# Patient Record
Sex: Female | Born: 1935 | Race: White | Hispanic: No | Smoking: Never smoker
Health system: Southern US, Community
[De-identification: ages and names within clinical notes are randomized; demographics above are authoritative.]

## PROBLEM LIST (undated history)

## (undated) DIAGNOSIS — E079 Disorder of thyroid, unspecified: Secondary | ICD-10-CM

## (undated) DIAGNOSIS — R001 Bradycardia, unspecified: Secondary | ICD-10-CM

## (undated) DIAGNOSIS — R17 Unspecified jaundice: Secondary | ICD-10-CM

## (undated) DIAGNOSIS — J90 Pleural effusion, not elsewhere classified: Secondary | ICD-10-CM

## (undated) DIAGNOSIS — N183 Chronic kidney disease, stage 3 unspecified: Secondary | ICD-10-CM

## (undated) DIAGNOSIS — I4819 Other persistent atrial fibrillation: Secondary | ICD-10-CM

## (undated) DIAGNOSIS — I10 Essential (primary) hypertension: Secondary | ICD-10-CM

## (undated) DIAGNOSIS — G250 Essential tremor: Secondary | ICD-10-CM

## (undated) DIAGNOSIS — I82409 Acute embolism and thrombosis of unspecified deep veins of unspecified lower extremity: Secondary | ICD-10-CM

## (undated) DIAGNOSIS — I5032 Chronic diastolic (congestive) heart failure: Secondary | ICD-10-CM

## (undated) DIAGNOSIS — E8809 Other disorders of plasma-protein metabolism, not elsewhere classified: Secondary | ICD-10-CM

## (undated) DIAGNOSIS — J309 Allergic rhinitis, unspecified: Principal | ICD-10-CM

## (undated) DIAGNOSIS — I4510 Unspecified right bundle-branch block: Secondary | ICD-10-CM

## (undated) HISTORY — DX: Other disorders of plasma-protein metabolism, not elsewhere classified: E88.09

## (undated) HISTORY — DX: Bradycardia, unspecified: R00.1

## (undated) HISTORY — DX: Acute embolism and thrombosis of unspecified deep veins of unspecified lower extremity: I82.409

## (undated) HISTORY — DX: Unspecified right bundle-branch block: I45.10

## (undated) HISTORY — DX: Essential (primary) hypertension: I10

## (undated) HISTORY — DX: Pleural effusion, not elsewhere classified: J90

## (undated) HISTORY — DX: Unspecified jaundice: R17

## (undated) HISTORY — DX: Allergic rhinitis, unspecified: J30.9

## (undated) HISTORY — PX: BUNIONECTOMY: SHX129

## (undated) HISTORY — DX: Other persistent atrial fibrillation: I48.19

## (undated) HISTORY — DX: Chronic kidney disease, stage 3 (moderate): N18.3

## (undated) HISTORY — DX: Chronic kidney disease, stage 3 unspecified: N18.30

## (undated) HISTORY — DX: Chronic diastolic (congestive) heart failure: I50.32

## (undated) HISTORY — DX: Essential tremor: G25.0

## (undated) HISTORY — PX: DILATION AND CURETTAGE OF UTERUS: SHX78

## (undated) HISTORY — PX: TONSILLECTOMY: SUR1361

---

## 2006-04-20 ENCOUNTER — Encounter: Payer: Self-pay | Admitting: Internal Medicine

## 2006-04-26 ENCOUNTER — Encounter: Payer: Self-pay | Admitting: Internal Medicine

## 2006-05-16 ENCOUNTER — Encounter: Payer: Self-pay | Admitting: Internal Medicine

## 2006-06-18 ENCOUNTER — Encounter: Payer: Self-pay | Admitting: Internal Medicine

## 2006-12-11 ENCOUNTER — Ambulatory Visit: Payer: Self-pay | Admitting: Internal Medicine

## 2006-12-11 DIAGNOSIS — E0590 Thyrotoxicosis, unspecified without thyrotoxic crisis or storm: Secondary | ICD-10-CM

## 2006-12-11 DIAGNOSIS — I10 Essential (primary) hypertension: Secondary | ICD-10-CM | POA: Insufficient documentation

## 2006-12-11 DIAGNOSIS — E041 Nontoxic single thyroid nodule: Secondary | ICD-10-CM

## 2007-02-11 ENCOUNTER — Ambulatory Visit: Payer: Self-pay | Admitting: Internal Medicine

## 2007-02-11 LAB — CONVERTED CEMR LAB
Calcium: 9.6 mg/dL (ref 8.4–10.5)
Free T4: 0.8 ng/dL (ref 0.6–1.6)
GFR calc Af Amer: 67 mL/min
GFR calc non Af Amer: 56 mL/min
Potassium: 4.4 meq/L (ref 3.5–5.1)
Sodium: 145 meq/L (ref 135–145)

## 2007-02-12 ENCOUNTER — Encounter: Payer: Self-pay | Admitting: Internal Medicine

## 2007-03-22 ENCOUNTER — Telehealth: Payer: Self-pay | Admitting: Internal Medicine

## 2007-07-04 ENCOUNTER — Ambulatory Visit: Payer: Self-pay | Admitting: Internal Medicine

## 2007-07-25 ENCOUNTER — Encounter: Payer: Self-pay | Admitting: Internal Medicine

## 2007-09-23 ENCOUNTER — Encounter: Payer: Self-pay | Admitting: Internal Medicine

## 2008-01-21 ENCOUNTER — Ambulatory Visit: Payer: Self-pay | Admitting: Internal Medicine

## 2008-01-21 LAB — CONVERTED CEMR LAB
BUN: 21 mg/dL
Basophils Absolute: 0.1 K/uL
Basophils Relative: 1.2 %
CO2: 30 meq/L
Calcium: 9.5 mg/dL
Chloride: 104 meq/L
Creatinine, Ser: 0.9 mg/dL
Eosinophils Absolute: 0.2 K/uL
Eosinophils Relative: 2.2 %
GFR calc Af Amer: 76 mL/min
GFR calc non Af Amer: 63 mL/min
Glucose, Bld: 116 mg/dL — ABNORMAL HIGH
HCT: 37.7 %
Hemoglobin: 12.9 g/dL
Lymphocytes Relative: 19.3 %
MCHC: 34.3 g/dL
MCV: 91.7 fL
Monocytes Absolute: 0.7 K/uL
Monocytes Relative: 7.4 %
Neutro Abs: 6.4 K/uL
Neutrophils Relative %: 69.9 %
Platelets: 262 K/uL
Potassium: 4.8 meq/L
RBC: 4.11 M/uL
RDW: 12.8 %
Sodium: 143 meq/L
TSH: 2.08 u[IU]/mL
WBC: 9.2 10*3/microliter

## 2008-07-22 ENCOUNTER — Telehealth: Payer: Self-pay | Admitting: Internal Medicine

## 2008-08-04 ENCOUNTER — Ambulatory Visit: Payer: Self-pay | Admitting: Internal Medicine

## 2008-08-05 DIAGNOSIS — G252 Other specified forms of tremor: Secondary | ICD-10-CM

## 2008-08-05 DIAGNOSIS — G250 Essential tremor: Secondary | ICD-10-CM

## 2008-12-28 ENCOUNTER — Ambulatory Visit: Payer: Self-pay | Admitting: Internal Medicine

## 2008-12-28 DIAGNOSIS — I4891 Unspecified atrial fibrillation: Secondary | ICD-10-CM

## 2008-12-28 LAB — CONVERTED CEMR LAB
BUN: 17 mg/dL (ref 6–23)
CO2: 32 meq/L (ref 19–32)
Creatinine, Ser: 1.1 mg/dL (ref 0.4–1.2)
Free T4: 0.9 ng/dL (ref 0.6–1.6)
Glucose, Bld: 112 mg/dL — ABNORMAL HIGH (ref 70–99)
Potassium: 4.1 meq/L (ref 3.5–5.1)
Sodium: 143 meq/L (ref 135–145)

## 2009-01-01 ENCOUNTER — Telehealth: Payer: Self-pay | Admitting: Internal Medicine

## 2009-01-03 ENCOUNTER — Telehealth: Payer: Self-pay | Admitting: Family Medicine

## 2009-01-04 ENCOUNTER — Telehealth: Payer: Self-pay | Admitting: Internal Medicine

## 2009-01-08 ENCOUNTER — Telehealth: Payer: Self-pay | Admitting: Internal Medicine

## 2009-01-12 ENCOUNTER — Encounter: Payer: Self-pay | Admitting: Internal Medicine

## 2009-01-12 ENCOUNTER — Ambulatory Visit (HOSPITAL_COMMUNITY): Admission: RE | Admit: 2009-01-12 | Discharge: 2009-01-12 | Payer: Self-pay | Admitting: Internal Medicine

## 2009-01-12 ENCOUNTER — Telehealth: Payer: Self-pay | Admitting: Internal Medicine

## 2009-01-12 ENCOUNTER — Ambulatory Visit: Payer: Self-pay | Admitting: Internal Medicine

## 2009-01-12 ENCOUNTER — Ambulatory Visit: Payer: Self-pay

## 2009-01-12 DIAGNOSIS — I359 Nonrheumatic aortic valve disorder, unspecified: Secondary | ICD-10-CM | POA: Insufficient documentation

## 2009-02-01 ENCOUNTER — Telehealth: Payer: Self-pay | Admitting: Internal Medicine

## 2009-03-22 ENCOUNTER — Ambulatory Visit: Payer: Self-pay | Admitting: Internal Medicine

## 2009-04-06 HISTORY — PX: LAPAROSCOPIC OVARIAN CYSTECTOMY: SUR786

## 2009-04-27 ENCOUNTER — Telehealth: Payer: Self-pay | Admitting: Internal Medicine

## 2009-04-27 ENCOUNTER — Ambulatory Visit: Payer: Self-pay | Admitting: Internal Medicine

## 2009-04-27 DIAGNOSIS — S0180XA Unspecified open wound of other part of head, initial encounter: Secondary | ICD-10-CM | POA: Insufficient documentation

## 2009-04-28 ENCOUNTER — Telehealth: Payer: Self-pay | Admitting: Internal Medicine

## 2009-05-05 ENCOUNTER — Ambulatory Visit: Payer: Self-pay | Admitting: Internal Medicine

## 2009-05-24 ENCOUNTER — Emergency Department (HOSPITAL_COMMUNITY): Admission: EM | Admit: 2009-05-24 | Discharge: 2009-05-24 | Payer: Self-pay | Admitting: Emergency Medicine

## 2009-05-25 ENCOUNTER — Telehealth: Payer: Self-pay | Admitting: Internal Medicine

## 2009-05-26 ENCOUNTER — Ambulatory Visit: Payer: Self-pay | Admitting: Internal Medicine

## 2009-05-26 DIAGNOSIS — R1903 Right lower quadrant abdominal swelling, mass and lump: Secondary | ICD-10-CM | POA: Insufficient documentation

## 2009-05-27 ENCOUNTER — Encounter: Admission: RE | Admit: 2009-05-27 | Discharge: 2009-05-27 | Payer: Self-pay | Admitting: Internal Medicine

## 2009-06-01 ENCOUNTER — Ambulatory Visit: Admission: RE | Admit: 2009-06-01 | Discharge: 2009-06-01 | Payer: Self-pay | Admitting: Gynecology

## 2009-06-01 ENCOUNTER — Encounter: Payer: Self-pay | Admitting: Internal Medicine

## 2009-06-08 ENCOUNTER — Encounter: Payer: Self-pay | Admitting: Obstetrics & Gynecology

## 2009-06-08 ENCOUNTER — Inpatient Hospital Stay (HOSPITAL_COMMUNITY): Admission: RE | Admit: 2009-06-08 | Discharge: 2009-06-11 | Payer: Self-pay | Admitting: Obstetrics & Gynecology

## 2009-06-18 ENCOUNTER — Telehealth: Payer: Self-pay | Admitting: Internal Medicine

## 2009-06-21 ENCOUNTER — Ambulatory Visit: Payer: Self-pay | Admitting: Internal Medicine

## 2009-06-21 DIAGNOSIS — I872 Venous insufficiency (chronic) (peripheral): Secondary | ICD-10-CM | POA: Insufficient documentation

## 2009-07-22 ENCOUNTER — Ambulatory Visit: Admission: RE | Admit: 2009-07-22 | Discharge: 2009-07-22 | Payer: Self-pay | Admitting: Gynecologic Oncology

## 2010-03-08 NOTE — Miscellaneous (Signed)
Summary: Doctor, [facility]   Imported By: Lester Saybrook 05/06/2009 12:03:53  _____________________________________________________________________  External Attachment:    Type:   Image     Comment:   External Document

## 2010-03-08 NOTE — Assessment & Plan Note (Signed)
Summary: FELL/ HIT FORHEAD/PER TRIAGE/NWS   Vital Signs:  Patient profile:   77 year old female Height:      67 inches O2 Sat:      97 % on Room air Temp:     97.1 degrees F oral Pulse rate:   63 / minute BP sitting:   142 / 60  (left arm) Cuff size:   regular  Vitals Entered By: Bill Salinas CMA (April 27, 2009 2:59 PM)  O2 Flow:  Room air   Primary Care Provider:  Gerard Bonus   History of Present Illness: Patient presents emergently: she fell at home striking her head on a TV stand sustaining a large and deep laceration to her right forehead. She had no loss of consciousness. She has no neurologic complaints. She has had no palpation, no chest pain, no respiratory problems. She denies feeling light-headed.  Current Medications (verified): 1)  Aspirin 325 Mg  Tabs (Aspirin) .... Take 1 Tablet By Mouth Once A Day 2)  Stool Softener 100 Mg  Caps (Docusate Sodium) .... As Needed 3)  Caltrate 600+d Plus 600-400 Mg-Unit  Tabs (Calcium Carbonate-Vit D-Min) .... Take 1 Tablet By Mouth Two Times A Day 4)  Otc Omega 3 1000mg  .... 2 By Mouth Once Daily 5)  Methimazole 5 Mg  Tabs (Methimazole) .Marland Kitchen.. 1 By Mouth Once Daily 6)  Taztia Xt 180 Mg  Cp24 (Diltiazem Hcl Er Beads) .Marland Kitchen.. 1 By Mouth Once Daily 7)  Labetalol Hcl 200 Mg Tabs (Labetalol Hcl) .... 2 Tablets Two Times A Day 8)  Furosemide 40 Mg  Tabs (Furosemide) .Marland Kitchen.. 1 Once Daily 9)  Eye Vitamins   Caps (Multiple Vitamins-Minerals) .... Take 1 Tablet By Mouth Once A Day  Allergies (verified): No Known Drug Allergies  Past History:  Past Medical History: Last updated: 12/11/2006 UCD rapid heart rate arrythmia Hypertension familial tremor  Past Surgical History: Last updated: 12/11/2006 Tonsillectomy-'39 GYN surgery-D&C '66 bunionectomy '88  G3P3-NSVD PSH reviewed for relevance, FH reviewed for relevance  Review of Systems  The patient denies anorexia, fever, weight loss, decreased hearing, hoarseness, chest pain, syncope,  headaches, abdominal pain, incontinence, muscle weakness, difficulty walking, abnormal bleeding, and enlarged lymph nodes.    Physical Exam  General:  Elderly woman with tremor who is awake and alert Head:  laceration on forehead down to bone; 8 cm in length from mid-forehead to over the right eye with a small leg to above the right eyebrow. Eyes:  vision grossly intact, pupils equal, and pupils round.   Lungs:  normal respiratory effort.   Heart:  normal rate and regular rhythm.   Neurologic:  alert & oriented X3 and cranial nerves II-XII intact.   Skin:  turgor normal and color normal.  See above for description of laceration  Psych:  Oriented X3, memory intact for recent and remote, normally interactive, and good eye contact.     Impression & Recommendations:  Problem # 1:  WOUND, OPEN, FACE, WITHOUT COMPLICATION (ICD-873.40)  Patient with large facial laceration-right forehead 7-8 cm in length. She was offered referral to plastic surgery for repair for better cosmesis. She declined and asked me to proceed with repair.  The wound was copiously irriated with sterile water using 30cc syringe and #18g needle for high pressure cleansing. Anesthesia obtained with 2% xylocaine w/ epinephrine injected to wound edges. Prepped with betadine.  Closure with 11 interrupted sutures using #3 ethilon with good wound edge approximation.  Pressure dressing applied with 4x4 folded gauze and  4" kerlex wrap.  Plan - telephone follow-up 3/23.           Routine wound precautions provided          Return in 1 week for suture removal  Orders: Lacerat Intermd NHF 2.6 - 7.5 cm (16109)  Complete Medication List: 1)  Aspirin 325 Mg Tabs (Aspirin) .... Take 1 tablet by mouth once a day 2)  Stool Softener 100 Mg Caps (Docusate sodium) .... As needed 3)  Caltrate 600+d Plus 600-400 Mg-unit Tabs (Calcium carbonate-vit d-min) .... Take 1 tablet by mouth two times a day 4)  Otc Omega 3 1000mg   .... 2 by mouth  once daily 5)  Methimazole 5 Mg Tabs (Methimazole) .Marland Kitchen.. 1 by mouth once daily 6)  Taztia Xt 180 Mg Cp24 (Diltiazem hcl er beads) .Marland Kitchen.. 1 by mouth once daily 7)  Labetalol Hcl 200 Mg Tabs (Labetalol hcl) .... 2 tablets two times a day 8)  Furosemide 40 Mg Tabs (Furosemide) .Marland Kitchen.. 1 once daily 9)  Eye Vitamins Caps (Multiple vitamins-minerals) .... Take 1 tablet by mouth once a day

## 2010-03-08 NOTE — Progress Notes (Signed)
  Phone Note Outgoing Call   Call placed by: Ami Bullins CMA,  Jun 18, 2009 4:53 PM Summary of Call: call Ms. Angevine to see how she was doing with no answer. Initial call taken by: Ami Bullins CMA,  Jun 18, 2009 4:53 PM

## 2010-03-08 NOTE — Assessment & Plan Note (Signed)
Summary: STITCH REMOVAL/NWS   Vital Signs:  Patient profile:   77 year old female Height:      67 inches Weight:      156 pounds O2 Sat:      98 % on Room air Temp:     96.7 degrees F oral Pulse rate:   54 / minute BP sitting:   136 / 62  (left arm) Cuff size:   regular  Vitals Entered By: Bill Salinas CMA (May 05, 2009 9:28 AM)  O2 Flow:  Room air CC: pt here for suture removal, 11 sutures removed from pt's forehead, pt tolerated well/ ab   Primary Care Provider:  Whitaker Holderman  CC:  pt here for suture removal, 11 sutures removed from pt's forehead, and pt tolerated well/ ab.  History of Present Illness: Patient seen last week for laceration on the right forehead requiring suture closure. She has done well without swelling, drainage, fever or pain. She presents for suture removal  Current Medications (verified): 1)  Aspirin 325 Mg  Tabs (Aspirin) .... Take 1 Tablet By Mouth Once A Day 2)  Stool Softener 100 Mg  Caps (Docusate Sodium) .... As Needed 3)  Caltrate 600+d Plus 600-400 Mg-Unit  Tabs (Calcium Carbonate-Vit D-Min) .... Take 1 Tablet By Mouth Two Times A Day 4)  Otc Omega 3 1000mg  .... 2 By Mouth Once Daily 5)  Methimazole 5 Mg  Tabs (Methimazole) .Marland Kitchen.. 1 By Mouth Once Daily 6)  Taztia Xt 180 Mg  Cp24 (Diltiazem Hcl Er Beads) .Marland Kitchen.. 1 By Mouth Once Daily 7)  Labetalol Hcl 200 Mg Tabs (Labetalol Hcl) .... 2 Tablets Two Times A Day 8)  Furosemide 40 Mg  Tabs (Furosemide) .Marland Kitchen.. 1 Once Daily 9)  Eye Vitamins   Caps (Multiple Vitamins-Minerals) .... Take 1 Tablet By Mouth Once A Day  Allergies (verified): No Known Drug Allergies  Review of Systems  The patient denies fever, weight loss, headaches, abnormal bleeding, and enlarged lymph nodes.    Physical Exam  General:  Elderly white female in no distress Skin:  wound on forehead looks well healed with good wound edge approximation. No surrounding edema or erythema. Sutures removed by Ami Bullin CMA without difficulty or  problem.   Impression & Recommendations:  Problem # 1:  WOUND, OPEN, FACE, WITHOUT COMPLICATION (ICD-873.40) Good healing. Sutures removed.  Plan - cleanse with soap and water           skin mositurizer to healing wound, e.g. cocoa butter.  Complete Medication List: 1)  Aspirin 325 Mg Tabs (Aspirin) .... Take 1 tablet by mouth once a day 2)  Stool Softener 100 Mg Caps (Docusate sodium) .... As needed 3)  Caltrate 600+d Plus 600-400 Mg-unit Tabs (Calcium carbonate-vit d-min) .... Take 1 tablet by mouth two times a day 4)  Otc Omega 3 1000mg   .... 2 by mouth once daily 5)  Methimazole 5 Mg Tabs (Methimazole) .Marland Kitchen.. 1 by mouth once daily 6)  Taztia Xt 180 Mg Cp24 (Diltiazem hcl er beads) .Marland Kitchen.. 1 by mouth once daily 7)  Labetalol Hcl 200 Mg Tabs (Labetalol hcl) .... 2 tablets two times a day 8)  Furosemide 40 Mg Tabs (Furosemide) .Marland Kitchen.. 1 once daily 9)  Eye Vitamins Caps (Multiple vitamins-minerals) .... Take 1 tablet by mouth once a day

## 2010-03-08 NOTE — Progress Notes (Signed)
  Phone Note Outgoing Call   Reason for Call: Get patient information Summary of Call: Please call Ms. Berkely today about her wound and how she is doing.  THANKS Initial call taken by: Jacques Navy MD,  April 28, 2009 6:44 AM  Follow-up for Phone Call        pt states her head is doing well and the bleeding has stopped. she states she only bleed here in the office. Pt states she is a little sore where she feel but other than that she is doing well Follow-up by: Ami Bullins CMA,  April 28, 2009 10:04 AM

## 2010-03-08 NOTE — Assessment & Plan Note (Signed)
Summary: LEG PAIN--STC   Vital Signs:  Patient profile:   77 year old female Height:      67 inches Weight:      154 pounds O2 Sat:      95 % on Room air Temp:     97.5 degrees F oral Pulse rate:   56 / minute BP sitting:   154 / 62  (left arm) Cuff size:   regular  Vitals Entered By: Bill Salinas CMA (March 22, 2009 4:18 PM)  O2 Flow:  Room air CC: pt here with complaint of soreness in her left lower leg, pt states it is sore to the touch. Pt has never had tetanus, pneumonia shot, bone density, colonoscopy, or mammogram/ ab   Primary Care Provider:  Briggs Edelen  CC:  pt here with complaint of soreness in her left lower leg, pt states it is sore to the touch. Pt has never had tetanus, pneumonia shot, bone density, colonoscopy, and or mammogram/ ab.  History of Present Illness: Patinet last seen Dec '10. Her Bp has been variable.  In the interval she has had bilateral cataract surgery. 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 As a result she has been off ASA for 5 weeks. She developed knots in the medial left calf at the site of small varicose veins. she has been exercising and applying heat and this has improved. she also resumed asa.     Current Medications (verified): 1)  Aspirin 325 Mg  Tabs (Aspirin) .... Take 1 Tablet By Mouth Once A Day 2)  Stool Softener 100 Mg  Caps (Docusate Sodium) .... As Needed 3)  Caltrate 600+d Plus 600-400 Mg-Unit  Tabs (Calcium Carbonate-Vit D-Min) .... Take 1 Tablet By Mouth Two Times A Day 4)  Otc Omega 3 1000mg  .... 2 By Mouth Once Daily 5)  Methimazole 5 Mg  Tabs (Methimazole) .Marland Kitchen.. 1 By Mouth Once Daily 6)  Taztia Xt 180 Mg  Cp24 (Diltiazem Hcl Er Beads) .... 2 By Mouth Once Daily 7)  Labetalol Hcl 200 Mg Tabs (Labetalol Hcl) .... 2 Tablets Two Times A Day 8)  Furosemide 40 Mg  Tabs (Furosemide) .Marland Kitchen.. 1 Once Daily 9)  Eye Vitamins   Caps (Multiple Vitamins-Minerals) .... 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 Laymen's Guide with forms provided.  Risk Factors: Alcohol Use: 0 (08/04/2008) Caffeine Use: less than 1 beverage a day (08/04/2008) Diet: heart healthy (08/04/2008) Exercise: no (08/04/2008)  Risk Factors: Smoking Status: never (08/04/2008) Passive Smoke Exposure: no (12/11/2006)  Review of Systems  The patient denies anorexia, fever, weight loss, decreased hearing, hoarseness, dyspnea on exertion, prolonged cough, abdominal pain, muscle weakness, and difficulty walking.    Physical Exam  General:  WNWD elderly woman in no distress Lungs:  normal respiratory effort and normal breath  sounds.   Heart:  normal rate and regular rhythm.   Msk:  normal ROM, no joint tenderness, and no joint swelling.   Skin:  palpale nodule over minor varicosities left calf with tenderness and minimal erythema Psych:  Oriented X3, memory intact for recent and remote, and normally interactive.     Impression & Recommendations:  Problem # 1:  PHLEBITIS&THROMBOPHLEB SUP VESSELS LOWER EXTREM (ICD-451.0) Patient has made a good diagnosis of superficial phlebitis and initiated  appropriate therapy with exercise, heat and APAP. There is no need for antibiotics. This is a self-limited problem and should resolve in 2 weeks. Cautioned to watch for increased pain, heat or fever.  Complete Medication List: 1)  Aspirin 325 Mg Tabs (Aspirin) .... Take 1 tablet by mouth once a day 2)  Stool Softener 100 Mg Caps (Docusate sodium) .... As needed 3)  Caltrate 600+d Plus 600-400 Mg-unit Tabs (Calcium carbonate-vit d-min) .... Take 1 tablet by mouth two times a day 4)  Otc Omega 3 1000mg   .... 2 by mouth once daily 5)  Methimazole 5 Mg Tabs (Methimazole) .Marland Kitchen.. 1 by mouth once daily 6)  Taztia Xt 180 Mg Cp24 (Diltiazem hcl er beads) .Marland Kitchen.. 1 by mouth once daily 7)  Labetalol Hcl 200 Mg Tabs (Labetalol hcl) .... 2 tablets two times a day 8)  Furosemide 40 Mg Tabs (Furosemide) .Marland Kitchen.. 1 once daily 9)  Eye Vitamins Caps (Multiple vitamins-minerals) .... Take 1 tablet by mouth once a day  Patient Instructions: 1)  Phlebitis - good diagnosis by patient and good treatment plan: exericse, warm compresses and support hose. Continue aspirin as you have. No need for antibiotics at this time. This should resolve in about 2 weeks. 2)  Blood pressure - see med list below. This is concordant with the series of phone calls and medication adjustments.  3)  The medication list was reviewed and reconciled.  All changed / newly prescribed medications were explained.  A complete medication list was provided to the patient / caregiver.

## 2010-03-08 NOTE — Consult Note (Signed)
Summary: GYN Oncology  GYN Oncology   Imported By: Lester Sammamish 06/08/2009 11:36:10  _____________________________________________________________________  External Attachment:    Type:   Image     Comment:   External Document

## 2010-03-08 NOTE — Progress Notes (Signed)
Summary: WORK IN?   Phone Note Call from Patient   Caller: Daughter Aram Beecham - [phone] Summary of Call: Pt's daugther called. Pt's feet became tangled in a carpet at her home and she fell hitting her forehead on the tv stand approx 30 min ago. Bleeding is under control with ice and cold compress. Per daughter area is approx 3/4 inch triangle that looks to her like it needs sutures.  Initial call taken by: Lamar Sprinkles, CMA,  April 27, 2009 1:59 PM  Follow-up for Phone Call        Pt was seen in the office today Follow-up by: Lamar Sprinkles, CMA,  April 27, 2009 5:24 PM

## 2010-03-08 NOTE — Assessment & Plan Note (Signed)
Summary: both legs swollen/cd   Vital Signs:  Patient profile:   77 year old female Height:      67 inches Weight:      157 pounds BMI:     24.68 O2 Sat:      96 % on Room air Temp:     97.0 degrees F oral Pulse rate:   60 / minute BP sitting:   144 / 60  (left arm) Cuff size:   regular  Vitals Entered By: Bill Salinas CMA (Jun 21, 2009 4:47 PM)  O2 Flow:  Room air CC: pt retaining fluid in both legs/ ab   Primary Care Provider:  Norins  CC:  pt retaining fluid in both legs/ ab.  History of Present Illness: Patient with recent surgery for removal of a giant ovarian cyst. she has done very well post-op and is back almost 100% to normal level of activity.  She has a h/o distal LE edema that gets progressive during the day and resolve/improves overnight. She has no h/o CHF, LV dysfunction or renal failure  Current Medications (verified): 1)  Aspirin 325 Mg  Tabs (Aspirin) .... Take 1 Tablet By Mouth Once A Day 2)  Stool Softener 100 Mg  Caps (Docusate Sodium) .... As Needed 3)  Caltrate 600+d Plus 600-400 Mg-Unit  Tabs (Calcium Carbonate-Vit D-Min) .... Take 1 Tablet By Mouth Two Times A Day 4)  Otc Omega 3 1000mg  .... 2 By Mouth Once Daily 5)  Methimazole 5 Mg  Tabs (Methimazole) .Marland Kitchen.. 1 By Mouth Once Daily 6)  Taztia Xt 180 Mg  Cp24 (Diltiazem Hcl Er Beads) .Marland Kitchen.. 1 By Mouth Once Daily 7)  Labetalol Hcl 200 Mg Tabs (Labetalol Hcl) .... 2 Tablets Two Times A Day 8)  Furosemide 40 Mg  Tabs (Furosemide) .Marland Kitchen.. 1 Once Daily 9)  Eye Vitamins   Caps (Multiple Vitamins-Minerals) .... Take 1 Tablet By Mouth Once A Day  Allergies (verified): No Known Drug Allergies  Past History:  Past Surgical History: Tonsillectomy-'39 GYN surgery-D&C '66 bunionectomy '88 laparotomy and excision of  ovarian cyst March '11  G3P3-NSVD  Family History: Reviewed history from 12/11/2006 and no changes required. neg breast, colon cancer,  sister and brother with DM father with  CAD mother-deceased pancreatic cancer  Social History: Reviewed history from 12/11/2006 and no changes required. 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 Laymen's Guide with forms provided.  Review of Systems  The patient denies anorexia, fever, weight loss, weight gain, chest pain, syncope, headaches, hematuria, muscle weakness, difficulty walking, abnormal bleeding, and angioedema.    Physical Exam  General:  Well-developed,well-nourished,in no acute distress; alert,appropriate and cooperative throughout examination Head:  normocephalic and atraumatic.   Eyes:  pupils equal.   Lungs:  normal respiratory effort, normal breath sounds, no crackles, and no wheezes.   Heart:  normal rate and regular rhythm.   Pulses:  2+ radial  Neurologic:  alert & oriented X3, cranial nerves II-XII intact, strength normal in all extremities, gait normal, and DTRs symmetrical and normal.   Skin:  turgor normal, color normal, no ulcerations, and no edema.   Cervical Nodes:  no anterior cervical adenopathy and no posterior cervical adenopathy.   Psych:  Oriented X3, memory intact for recent and remote, and  good eye contact.     Impression & Recommendations:  Problem # 1:  VENOUS INSUFFICIENCY (ICD-459.81) explained in detail. No indication for medical management  Plan - elevation of legs           support hose: otc's first, if not helpful Rx  20-43mmHg  Problem # 2:  ABD/PELV SWELLING MASS/LUMP RIGHT LOWER QUADRANT (YNW-295.62) Patient found to have a benigh ovarian cyst - s/p excision with relief of back pain and other symptoms.   Complete Medication List: 1)  Aspirin 325 Mg Tabs (Aspirin)  .... Take 1 tablet by mouth once a day 2)  Stool Softener 100 Mg Caps (Docusate sodium) .... As needed 3)  Caltrate 600+d Plus 600-400 Mg-unit Tabs (Calcium carbonate-vit d-min) .... Take 1 tablet by mouth two times a day 4)  Otc Omega 3 1000mg   .... 2 by mouth once daily 5)  Methimazole 5 Mg Tabs (Methimazole) .Marland Kitchen.. 1 by mouth once daily 6)  Taztia Xt 180 Mg Cp24 (Diltiazem hcl er beads) .Marland Kitchen.. 1 by mouth once daily 7)  Labetalol Hcl 200 Mg Tabs (Labetalol hcl) .... 2 tablets two times a day 8)  Furosemide 40 Mg Tabs (Furosemide) .Marland Kitchen.. 1 once daily 9)  Eye Vitamins Caps (Multiple vitamins-minerals) .... Take 1 tablet by mouth once a day

## 2010-03-08 NOTE — Progress Notes (Signed)
Summary: Call Report  Phone Note Other Incoming   Caller: [facility] Call Report Summary of Call: [facility] Triage Call Report Triage Record Num: [id number] Operator: April Finney Patient Name: Danielle Adams Call Date & Time: 05/24/2009 9:13:49PM Patient Phone: PCP: Patient Gender: Female PCP Fax : Patient DOB: [birth year] Practice Name: [facility] Reason for Call: Daughter calling about unbearable abd pain that is constant. Had BM earlier with streaks of blood. Pain is on right lower abd and radiates down her leg. See in ED care advice given. Going to [facility]. Protocol(s) Used: Abdominal Pain / Discomfort Recommended Outcome per Protocol: See ED Immediately Reason for Outcome: Pain described as deep, boring, or tearing Care Advice:  ~ Another adult should drive.  ~ Do not give the patient anything to eat or drink.  ~ Do not push on abdomen. Write down provider's name. List or place the following in a bag for transport with the patient: current prescription and/or OTC medications; alternative treatments, therapies and medications; and street drugs.  ~  ~ IMMEDIATE ACTION 04/ Initial call taken by: [name] Pyle, CMA,  May 25, 2009 8:32 AM  Follow-up for Phone Call        reviewed ED note: large cystic mass in the area of he adenxa. Apparently sent home. Please call patient - may need to be seen if having pain and discomfort - if severe can do a direct admit.  Follow-up by: Jacques Navy MD,  May 25, 2009 9:33 AM  Additional Follow-up for Phone Call Additional follow up Details #1::        Per pt's daugther, Pt is taking hydrocodone/apap 5/500, last one taken was last night. She feels better today, c/o soreness today. ED told pt to go to GYN, [facility] at [facility]. They are unable to see pt until Monday. They are also not in network. Can we refer to another doctor who is in network?  Advised daughter if pt's pain returned to call  office. Additional Follow-up by: Lamar Sprinkles, CMA,  May 25, 2009 9:53 AM    Additional Follow-up for Phone Call Additional follow up Details #2::    Needs OV tomorrow or thursday with me to reveiw studies, exam her, etc. We can then make an appropriate referral. Follow-up by: Jacques Navy MD,  May 25, 2009 1:25 PM  Additional Follow-up for Phone Call Additional follow up Details #3:: Details for Additional Follow-up Action Taken: pt put in to see MD tomm. at 1pm Additional Follow-up by: Ami Bullins CMA,  May 25, 2009 1:41 PM

## 2010-03-08 NOTE — Assessment & Plan Note (Signed)
Summary: per md/ ab   Vital Signs:  Patient profile:   77 year old female Height:      67 inches (170.18 cm) Weight:      155.25 pounds (70.57 kg) O2 Sat:      96 % on Room air Temp:     98.2 degrees F (36.78 degrees C) oral Pulse rate:   62 / minute Pulse rhythm:   regular BP sitting:   114 / 48  (left arm) Cuff size:   regular  Vitals Entered By: Brenton Grills (May 26, 2009 1:18 PM)  O2 Flow:  Room air CC: pt here for office visit/pt states she went to ER monday night CAT scan was done and there was a mass found on right side(ovarian cyst) /pt states she has no appetitie, feeling nauseous/aj   Primary Care Provider:  Xia Stohr  CC:  pt here for office visit/pt states she went to ER monday night CAT scan was done and there was a mass found on right side(ovarian cyst) /pt states she has no appetitie and feeling nauseous/aj.  History of Present Illness: Patient for ED follow-up. She was seen in the ED for severe lower abdominal pain. CT revealed an 8 x 11 cm cystic mass in the pelvis which may be ovarian in origin. She continues to have pain and fullness in the pelvis, especially with sitting. She has not had weight loss, progressively increased girth, prior pain. She has had no fevers. She has no prior history of any gyn problems.   Current Medications (verified): 1)  Aspirin 325 Mg  Tabs (Aspirin) .... Take 1 Tablet By Mouth Once A Day 2)  Stool Softener 100 Mg  Caps (Docusate Sodium) .... As Needed 3)  Caltrate 600+d Plus 600-400 Mg-Unit  Tabs (Calcium Carbonate-Vit D-Min) .... Take 1 Tablet By Mouth Two Times A Day 4)  Otc Omega 3 1000mg  .... 2 By Mouth Once Daily 5)  Methimazole 5 Mg  Tabs (Methimazole) .Marland Kitchen.. 1 By Mouth Once Daily 6)  Taztia Xt 180 Mg  Cp24 (Diltiazem Hcl Er Beads) .Marland Kitchen.. 1 By Mouth Once Daily 7)  Labetalol Hcl 200 Mg Tabs (Labetalol Hcl) .... 2 Tablets Two Times A Day 8)  Furosemide 40 Mg  Tabs (Furosemide) .Marland Kitchen.. 1 Once Daily 9)  Eye Vitamins   Caps (Multiple  Vitamins-Minerals) .... Take 1 Tablet By Mouth Once A Day  Allergies (verified): No Known Drug Allergies  Past History:  Past Medical History: Last updated: 12/11/2006 UCD rapid heart rate arrythmia Hypertension familial tremor  Past Surgical History: Last updated: 12/11/2006 Tonsillectomy-'39 GYN surgery-D&C '66 bunionectomy '88  G3P3-NSVD  Family History: Last updated: 12/11/2006 neg breast, colon cancer,  sister and brother with DM father with CAD mother-deceased pancreatic cancer  Social History: Last updated: 12/11/2006 1 year business school married 1948- widowed 1981 3 daughters, 7 grandchildren, 2 great-grandchildren Work: Systems analyst, 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 Laymen's Guide with forms provided.  Risk Factors: Alcohol Use: 0 (08/04/2008) Caffeine Use: less than 1 beverage a day (08/04/2008) Diet: heart healthy (08/04/2008) Exercise: no (08/04/2008)  Risk Factors: Smoking Status: never (08/04/2008) Passive Smoke Exposure: no (12/11/2006)  Review of Systems       The patient complains of abdominal pain.  The patient denies anorexia, fever, weight loss, weight gain, hoarseness, syncope, peripheral edema, prolonged cough, headaches, melena, severe indigestion/heartburn,  muscle weakness, difficulty walking, unusual weight change, and enlarged lymph nodes.    Physical Exam  General:  Elderly whiute woamn who looks younger than her stated age. She is in no distress Eyes:  no jaundice Lungs:  normal respiratory effort.   Heart:  normal rate and regular rhythm.   Abdomen:  soft.  Tender in the lower quadrants Neurologic:  alert & oriented X3 and gait normal.   Skin:  turgor normal and color normal.   Psych:  Oriented X3, memory intact for recent and remote,  good eye contact, and not anxious appearing.     Impression & Recommendations:  Problem # 1:  ABD/PELV SWELLING MASS/LUMP RIGHT LOWER QUADRANT (ZOX-096.04) Newly discovered pelvic mass that appears cystic. Reviewed images and report with the patient.  Plan - MRI pelvic for 0900hr Thursday, 4/21.           Will need tissue diagnosis - aspiration or needle bx by gyn or surgery.  Orders: Radiology Referral (Radiology)  Complete Medication List: 1)  Aspirin 325 Mg Tabs (Aspirin) .... Take 1 tablet by mouth once a day 2)  Stool Softener 100 Mg Caps (Docusate sodium) .... As needed 3)  Caltrate 600+d Plus 600-400 Mg-unit Tabs (Calcium carbonate-vit d-min) .... Take 1 tablet by mouth two times a day 4)  Otc Omega 3 1000mg   .... 2 by mouth once daily 5)  Methimazole 5 Mg Tabs (Methimazole) .Marland Kitchen.. 1 by mouth once daily 6)  Taztia Xt 180 Mg Cp24 (Diltiazem hcl er beads) .Marland Kitchen.. 1 by mouth once daily 7)  Labetalol Hcl 200 Mg Tabs (Labetalol hcl) .... 2 tablets two times a day 8)  Furosemide 40 Mg Tabs (Furosemide) .Marland Kitchen.. 1 once daily 9)  Eye Vitamins Caps (Multiple vitamins-minerals) .... Take 1 tablet by mouth once a day

## 2010-04-26 LAB — BASIC METABOLIC PANEL
BUN: 12 mg/dL (ref 6–23)
BUN: 8 mg/dL (ref 6–23)
BUN: 9 mg/dL (ref 6–23)
CO2: 26 mEq/L (ref 19–32)
Calcium: 8.1 mg/dL — ABNORMAL LOW (ref 8.4–10.5)
Calcium: 8.5 mg/dL (ref 8.4–10.5)
Chloride: 101 mEq/L (ref 96–112)
Chloride: 98 mEq/L (ref 96–112)
Creatinine, Ser: 1.13 mg/dL (ref 0.4–1.2)
GFR calc Af Amer: 55 mL/min — ABNORMAL LOW (ref >60)
Glucose, Bld: 148 mg/dL — ABNORMAL HIGH (ref 70–99)
Glucose, Bld: 152 mg/dL — ABNORMAL HIGH (ref 70–99)
Sodium: 138 mEq/L (ref 135–145)

## 2010-04-26 LAB — DIFFERENTIAL
Basophils Absolute: 0 10*3/uL (ref 0.0–0.1)
Basophils Absolute: 0 10*3/uL (ref 0.0–0.1)
Basophils Relative: 0 % (ref 0–1)
Basophils Relative: 0 % (ref 0–1)
Eosinophils Absolute: 0.2 10*3/uL (ref 0.0–0.7)
Eosinophils Relative: 2 % (ref 0–5)
Lymphocytes Relative: 12 % (ref 12–46)
Lymphocytes Relative: 14 % (ref 12–46)
Lymphs Abs: 1.3 10*3/uL (ref 0.7–4.0)
Lymphs Abs: 1.4 10*3/uL (ref 0.7–4.0)
Monocytes Absolute: 1 10*3/uL (ref 0.1–1.0)
Monocytes Relative: 10 % (ref 3–12)
Neutro Abs: 7.5 10*3/uL (ref 1.7–7.7)
Neutrophils Relative %: 74 % (ref 43–77)
Neutrophils Relative %: 82 % — ABNORMAL HIGH (ref 43–77)

## 2010-04-26 LAB — CBC
HCT: 28.2 % — ABNORMAL LOW (ref 36.0–46.0)
HCT: 31.1 % — ABNORMAL LOW (ref 36.0–46.0)
Hemoglobin: 9.4 g/dL — ABNORMAL LOW (ref 12.0–15.0)
Hemoglobin: 10.3 g/dL — ABNORMAL LOW (ref 12.0–15.0)
Hemoglobin: 11.8 g/dL — ABNORMAL LOW (ref 12.0–15.0)
MCHC: 33.5 g/dL (ref 30.0–36.0)
MCHC: 32.9 g/dL (ref 30.0–36.0)
MCHC: 33.1 g/dL (ref 30.0–36.0)
MCV: 91.8 fL (ref 78.0–100.0)
MCV: 92.6 fL (ref 78.0–100.0)
Platelets: 356 10*3/uL (ref 150–400)
Platelets: 402 10*3/uL — ABNORMAL HIGH (ref 150–400)
RBC: 3.36 MIL/uL — ABNORMAL LOW (ref 3.87–5.11)
RBC: 3.9 MIL/uL (ref 3.87–5.11)
RDW: 14.4 % (ref 11.5–15.5)
RDW: 14.2 % (ref 11.5–15.5)
WBC: 10.1 10*3/uL (ref 4.0–10.5)
WBC: 10.9 10*3/uL — ABNORMAL HIGH (ref 4.0–10.5)

## 2010-04-26 LAB — COMPREHENSIVE METABOLIC PANEL
ALT: 20 U/L (ref 0–35)
AST: 23 U/L (ref 0–37)
Albumin: 3.1 g/dL — ABNORMAL LOW (ref 3.5–5.2)
Alkaline Phosphatase: 91 U/L (ref 39–117)
Alkaline Phosphatase: 93 U/L (ref 39–117)
BUN: 18 mg/dL (ref 6–23)
GFR calc non Af Amer: 55 mL/min — ABNORMAL LOW (ref >60)
Glucose, Bld: 145 mg/dL — ABNORMAL HIGH (ref 70–99)
Potassium: 3.5 mEq/L (ref 3.5–5.1)
Sodium: 140 mEq/L (ref 135–145)
Total Bilirubin: 0.4 mg/dL (ref 0.3–1.2)
Total Protein: 6.4 g/dL (ref 6.0–8.3)
Total Protein: 6.5 g/dL (ref 6.0–8.3)

## 2010-04-26 LAB — URINALYSIS, ROUTINE W REFLEX MICROSCOPIC
Glucose, UA: NEGATIVE mg/dL
Hgb urine dipstick: NEGATIVE
Ketones, ur: NEGATIVE mg/dL

## 2010-04-26 LAB — URINE MICROSCOPIC-ADD ON

## 2010-04-26 LAB — ABO/RH: ABO/RH(D): O POS

## 2010-04-26 LAB — TYPE AND SCREEN: ABO/RH(D): O POS

## 2010-05-30 ENCOUNTER — Other Ambulatory Visit: Payer: Self-pay | Admitting: Internal Medicine

## 2010-06-21 ENCOUNTER — Encounter: Payer: Self-pay | Admitting: *Deleted

## 2010-06-22 ENCOUNTER — Ambulatory Visit (INDEPENDENT_AMBULATORY_CARE_PROVIDER_SITE_OTHER): Payer: Medicare Other | Admitting: Internal Medicine

## 2010-06-22 DIAGNOSIS — H9190 Unspecified hearing loss, unspecified ear: Secondary | ICD-10-CM

## 2010-06-22 DIAGNOSIS — H6120 Impacted cerumen, unspecified ear: Secondary | ICD-10-CM

## 2010-06-22 MED ORDER — DOCUSATE SODIUM 100 MG PO CAPS: 100.0000 mg | ORAL_CAPSULE | ORAL | Status: DC | PRN

## 2010-06-22 MED ORDER — LABETALOL HCL 200 MG PO TABS: 200.0000 mg | ORAL_TABLET | Freq: Two times a day (BID) | ORAL | Status: DC

## 2010-06-22 MED ORDER — FUROSEMIDE 40 MG PO TABS: 40.0000 mg | ORAL_TABLET | Freq: Every day | ORAL | Status: DC

## 2010-06-22 MED ORDER — DILTIAZEM HCL ER BEADS 180 MG PO CP24: 180.0000 mg | ORAL_CAPSULE | Freq: Every day | ORAL | Status: DC

## 2010-06-22 MED ORDER — HYDROCORTISONE 2.5 % RE CREA: TOPICAL_CREAM | Freq: Two times a day (BID) | RECTAL | Status: DC

## 2010-06-22 NOTE — Progress Notes (Signed)
  Subjective:    Patient ID: Danielle Adams, female    DOB: 09-13-1916, 77 y.o.   MRN: [id number]  HPI Mrs. Lanyon presents for hearing loss and suspected cerumen impaction. She has had no fever or sweats or chills. She reports that she has been doing well.  Past Medical History  Diagnosis Date  . Rapid heart rate     arrythmia  . HTN (hypertension)   . Familial tremor    Past Surgical History  Procedure Date  . Tonsillectomy   . Dilation and curettage of uterus   . Bunionectomy   . Laparoscopic ovarian cystectomy 04/2009   Family History  Problem Relation Age of Onset  . Pancreatic cancer Mother   . Coronary artery disease Father   . Diabetes Sister   . Diabetes Brother    History   Social History  . Marital Status: Widowed    Spouse Name: N/A    Number of Children: N/A  . Years of Education: N/A   Occupational History  . Not on file.   Social History Main Topics  . Smoking status: Not on file  . Smokeless tobacco: Not on file  . Alcohol Use: Not on file  . Drug Use: Not on file  . Sexually Active: Not on file   Other Topics Concern  . 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 Laymen's Guide with forms provided.      Review of Systems Review of Systems  Constitutional:  Negative for fever, chills, activity change and unexpected weight change.  HENT:  Negative for hearing loss, ear pain, congestion, neck stiffness and postnasal drip.   Eyes: Negative for pain, discharge and visual disturbance.  Respiratory: Negative for chest tightness and wheezing.   Cardiovascular: Negative for chest pain and palpitations.       [No  decreased exercise tolerance Gastrointestinal: [No change in bowel habit. No bloating or gas. No reflux or indigestion Genitourinary: Negative for urgency, frequency, flank pain and difficulty urinating.  Musculoskeletal: Negative for myalgias, back pain, arthralgias and gait problem.  Neurological: Negative for dizziness, tremors, weakness and headaches.  Hematological: Negative for adenopathy.  Psychiatric/Behavioral: Negative for behavioral problems and dysphoric mood.      Objective:   Physical Exam Vitals reviewed Gen'l WNWD elderly white woman in no distress with a marked resting tremor HEENT- bilateral cerumen impactions. After irrigation the left EAC  Has a small abrasion at the posterior aspect       Assessment & Plan:  1. Cerumen impaction with hearing loss - much better after irrigation.   Plan - at the patient's request she will be referred to Dr. Marciano Sequin for audiometry and fitting for amplification.

## 2010-07-22 ENCOUNTER — Encounter: Payer: Self-pay | Admitting: Internal Medicine

## 2010-07-22 ENCOUNTER — Ambulatory Visit: Payer: PRIVATE HEALTH INSURANCE | Admitting: Internal Medicine

## 2010-07-22 ENCOUNTER — Ambulatory Visit (INDEPENDENT_AMBULATORY_CARE_PROVIDER_SITE_OTHER): Payer: Medicare Other | Admitting: Internal Medicine

## 2010-07-22 VITALS — BP 124/68 | HR 65 | Temp 97.5°F | Resp 14 | Wt 155.5 lb

## 2010-07-22 DIAGNOSIS — M79609 Pain in unspecified limb: Secondary | ICD-10-CM

## 2010-07-22 DIAGNOSIS — M79604 Pain in right leg: Secondary | ICD-10-CM

## 2010-07-22 MED ORDER — TRAMADOL HCL 50 MG PO TABS: 50.0000 mg | ORAL_TABLET | Freq: Four times a day (QID) | ORAL | Status: DC | PRN

## 2010-07-22 MED ORDER — PREDNISONE 10 MG PO TABS: 10.0000 mg | ORAL_TABLET | Freq: Every day | ORAL | Status: AC

## 2010-07-22 NOTE — Progress Notes (Signed)
Subjective:    Patient ID: Danielle Adams, female    DOB: October 20, 1916, 77 y.o.   MRN: [id number]  HPI Mr.s Aida Puffer presents for the acute onset of pain right leg at times from the hip to the foot but predominantly involving the anterior thigh, knee and the pain is much worse when supine. She has a history of chronic low back pain. She has not had any injury or trauma or other inciting events. She denies any persistent paresthesia or muscle weakness. She is otherwise feeling herself to be in her usual state of health.  Past Medical History  Diagnosis Date  . Rapid heart rate     arrythmia  . HTN (hypertension)   . Familial tremor    Past Surgical History  Procedure Date  . Tonsillectomy   . Dilation and curettage of uterus   . Bunionectomy   . Laparoscopic ovarian cystectomy 04/2009   Family History  Problem Relation Age of Onset  . Pancreatic cancer Mother   . Coronary artery disease Father   . Diabetes Sister   . Diabetes Brother    History   Social History  . Marital Status: Widowed    Spouse Name: N/A    Number of Children: N/A  . Years of Education: N/A   Occupational History  . Not on file.   Social History Main Topics  . Smoking status: Never Smoker   . Smokeless tobacco: Not on file  . Alcohol Use: Not on file  . Drug Use: Not on file  . Sexually Active: Not on file   Other Topics Concern  . Not on file   Social History Narrative   [employer]1 year business schoolMarried 1948-widowed [phone] daughters, 7 grandchildren, 2 great-grandchildrenWork: Systems analyst, retired [phone] moved to [place] August '08-living with daughterTerminal care-does Not want cardiac resuscitation or mechanical ventilation, artifical nutrition or hydration, renal dialysis or major heroic intervention. Laymen's Guide with forms provided.      Review of Systems Review of Systems  Constitutional:  Negative for fever, chills, activity change and unexpected weight change.  HEENT:  Negative  for hearing loss, ear pain, congestion, neck stiffness and postnasal drip. Negative for sore throat or swallowing problems. Negative for dental complaints.   Eyes: Negative for vision loss or change in visual acuity.  Respiratory: Negative for chest tightness and wheezing.   Cardiovascular: Negative for chest pain and palpitation No decreased exercise tolerance Gastrointestinal: No change in bowel habit. No bloating or gas. No reflux or indigestion Genitourinary: Negative for urgency, frequency, flank pain and difficulty urinating.  Musculoskeletal: Negative for myalgias, arthralgias and gait problem. Positive for leg pain per the HPI Neurological: Negative for dizziness, weakness and headaches. Positive for familial/benign tremor Hematological: Negative for adenopathy.  Psychiatric/Behavioral: Negative for behavioral problems and dysphoric mood.       Objective:   Physical Exam Vital reviewed and normal Gen'l - elderly white woman with a tremor who is bright and alert and in no distress. Back/leg exam: able to stand without assistance, nl flexion at the waist, nl gait and turn, able to get up to exam table without assistance. Nl SLR sitting, nl DTRs at patellar tendon. Nl sensation to light touch and pin-prick.        Assessment & Plan:  1. Leg pain - the patient's symptoms of pain in the quadraceps, without weakness or paresthesia and worse when supine is suspicious for spinal stenosis. Searched her record: no imaging studies of the lumbar spine.   Plan -  empiric treatment for spinal stenosis, acute, with prednisone burst and taper.           For pain - tramadol 50-100mg  q8 prn           For unrelieved pain with need L-S spine films and possibly an MRI.

## 2010-07-22 NOTE — Patient Instructions (Signed)
Leg pain - based on your symptoms - pain predominantly in the proximal anterior right lower extremity, worse when supine (laying down) and with a relatively normal exam of the leg, I suspect acute spinal stenosis vs.disk pressure to the L2-3, L3-4 right nerve roots. Plan - a prednisone burst and taper (instructions on the prescription) and tramadol 50mg  1 or 2 tablets every six hours as needed for pain. For persistent pain that does not improve will need to obtain imaging studies of the back to refine the diagnosis.       Spinal Stenosis One cause of back pain is spinal stenosis. Stenosis means abnormal narrowing. The spinal canal contains and protects the spinal nerve roots. In spinal stenosis, the spinal canal narrows and pinches the spinal cord and nerves. This causes low back pain and pain in the legs. Stenosis may pinch the nerves that control muscles and sensation in the legs. This leads to pain and abnormal feelings in the leg muscles and areas supplied by those nerves. CAUSES Spinal stenosis often happens to people as they get older and arthritic boney growths occur in their spinal canal. There is also a loss of the disk height between the bones of the back, which also adds to this problem. Sometimes the problem is present at birth. SYMPTOMS  Pain that is generally worse with activities, particularly standing and walking.   Numbness, tingling, hot or cold feelings, weakness, or a weariness in the legs.   Clumsiness, frequent falling, and a foot-slapping gait, which may come as a result of nerve pressure and muscle weakness.  DIAGNOSIS  Your caregiver may suspect spinal stenosis if you have unusual leg symptoms, such as those previously mentioned.   Your orthopedic surgeon may request special imaging exams, such a computerized magnetic scan (MRI) or computerized X-ray scan (CT) to find out the cause of the problem.  TREATMENT  Sometimes treatments such as postural changes or  nonsteroidal anti-inflammatory drugs will relieve the pain.   Nonsteroidal anti-inflammatory medications may help relieve symptoms. These medicines do this by decreasing swelling and inflammation in the nerves.   When stenosis causes severe nerve root compression, conservative treatment may not be enough to maintain a normal life style. Surgery may be recommended to relieve the pressure on affected nerves. In properly selected patients, the results are very good, and patients are able to continue a normal lifestyle.  HOME CARE INSTRUCTIONS  Flexing the spine by leaning forward while walking may relieve symptoms. Lying with the knees drawn up to the chest may offer some relief. These positions enlarge the space available to the nerves. They may make it easier for stenosis sufferers to walk longer distances.   Rest, followed by a gradual resumption of activity, also can help.   Aerobic activity, such as bicycling or swimming, is often recommended.   Losing weight can also relieve some of the load on the spine.   Application of warm or cold compresses to the area of pain can be helpful.  SEEK MEDICAL CARE IF:    The periods of relief between episodes of pain become shorter and shorter.   You experience pain that radiates down your leg, even when you are not standing or walking.  SEEK IMMEDIATE MEDICAL CARE IF:  You have a loss of bowel or bladder control.   You have a sudden loss of feeling in your legs.   You suddenly cannot move your legs.  Document Released: 04/15/2003 Document Re-Released: 07/13/2009 [facility] Patient Information 2011 [employer] 

## 2010-07-23 ENCOUNTER — Encounter: Payer: Self-pay | Admitting: Internal Medicine

## 2010-08-02 ENCOUNTER — Other Ambulatory Visit: Payer: Self-pay | Admitting: Internal Medicine

## 2010-09-06 ENCOUNTER — Other Ambulatory Visit: Payer: Self-pay | Admitting: Internal Medicine

## 2010-09-07 NOTE — Telephone Encounter (Signed)
Rx Done . 

## 2010-10-02 ENCOUNTER — Other Ambulatory Visit: Payer: Self-pay | Admitting: Internal Medicine

## 2010-12-02 ENCOUNTER — Other Ambulatory Visit: Payer: Self-pay | Admitting: Internal Medicine

## 2010-12-04 ENCOUNTER — Other Ambulatory Visit: Payer: Self-pay | Admitting: Internal Medicine

## 2010-12-05 NOTE — Telephone Encounter (Signed)
Done

## 2011-01-19 ENCOUNTER — Other Ambulatory Visit: Payer: Self-pay | Admitting: Internal Medicine

## 2011-01-25 ENCOUNTER — Encounter: Payer: Self-pay | Admitting: Internal Medicine

## 2011-01-25 ENCOUNTER — Ambulatory Visit (INDEPENDENT_AMBULATORY_CARE_PROVIDER_SITE_OTHER): Payer: Medicare Other | Admitting: Internal Medicine

## 2011-01-25 VITALS — BP 132/68 | HR 62 | Temp 97.6°F | Wt 155.0 lb

## 2011-01-25 DIAGNOSIS — I10 Essential (primary) hypertension: Secondary | ICD-10-CM

## 2011-01-25 DIAGNOSIS — J309 Allergic rhinitis, unspecified: Secondary | ICD-10-CM

## 2011-01-25 HISTORY — DX: Allergic rhinitis, unspecified: J30.9

## 2011-01-25 MED ORDER — FLUTICASONE PROPIONATE 50 MCG/ACT NA SUSP: 2.0000 | Freq: Every day | NASAL | Status: DC

## 2011-01-25 MED ORDER — METHYLPREDNISOLONE ACETATE PF 80 MG/ML IJ SUSP
120.0000 mg | Freq: Once | INTRAMUSCULAR | Status: AC
  Administered 2011-01-25: 120 mg via INTRAMUSCULAR

## 2011-01-25 MED ORDER — FEXOFENADINE HCL 180 MG PO TABS: 180.0000 mg | ORAL_TABLET | Freq: Every day | ORAL | Status: DC

## 2011-01-25 NOTE — Progress Notes (Signed)
Subjective:    Patient ID: Danielle Adams, female    DOB: [birth year], 77 y.o.   MRN: [id number]  HPI  Here to f/u with family member, Does have several wks ongoing nasal allergy symptoms with clear congestion, itch and sneeze, without fever, pain, or wheezing, but does have mild ST and severe ongoing cough apparently with post nasal gtt though this is a new problem for her.  No prior hx or c/o asthma like symptoms or reflux, no hoarseness or voice change.  A large problem is cough at night, hard to sleep.   Pt denies fever, wt loss, night sweats, loss of appetite, or other constitutional symptoms No other new complaints.  Pt denies chest pain, increased sob or doe, wheezing, orthopnea, PND, increased LE swelling, palpitations, dizziness or syncope.  Pt denies new neurological symptoms such as new headache, or facial or extremity weakness or numbness   Pt denies polydipsia, polyuria. Past Medical History  Diagnosis Date  . Rapid heart rate     arrythmia  . HTN (hypertension)   . Familial tremor   . Allergic rhinitis, cause unspecified 01/25/2011   Past Surgical History  Procedure Date  . Tonsillectomy   . Dilation and curettage of uterus   . Bunionectomy   . Laparoscopic ovarian cystectomy 04/2009    reports that she has never smoked. She does not have any smokeless tobacco history on file. Her alcohol and drug histories not on file. family history includes Coronary artery disease in her father; Diabetes in her brother and sister; and Pancreatic cancer in her mother. No Known Allergies Current Outpatient Prescriptions on File Prior to Visit  Medication Sig Dispense Refill  . aspirin 325 MG tablet Take 325 mg by mouth daily.        . Calcium Carbonate-Vitamin D (CALTRATE 600+D) 600-400 MG-UNIT per tablet Take 1 tablet by mouth 2 (two) times daily.        Marland Kitchen diltiazem (TIAZAC) 180 MG 24 hr capsule Take 1 capsule (180 mg total) by mouth daily.  30 capsule  11  . docusate sodium (COLACE) 100 MG  capsule Take 1 capsule (100 mg total) by mouth as needed. Stool Softener  10 capsule  3  . furosemide (LASIX) 40 MG tablet Take 1 tablet (40 mg total) by mouth daily.  90 tablet  3  . hydrocortisone-pramoxine (ANALPRAM-HC) 2.5-1 % rectal cream PLACE RECTALLY TWICE DAILY  30 g  1  . labetalol (NORMODYNE) 200 MG tablet TAKE ONE TABLET BY MOUTH TWICE DAILY  120 tablet  0  . methimazole (TAPAZOLE) 5 MG tablet TAKE ONE (1) TABLET(S) ONCE DAILY  90 tablet  2  . Multiple Vitamins-Minerals (EYE VITAMINS) CAPS Take 1 each by mouth daily.        . OMEGA 3 1000 MG CAPS Take 2 capsules by mouth daily.        . traMADol (ULTRAM) 50 MG tablet Take 1 tablet (50 mg total) by mouth every 6 (six) hours as needed for pain.  60 tablet  2   No current facility-administered medications on file prior to visit.   Review of Systems Review of Systems  Constitutional: Negative for diaphoresis and unexpected weight change.  HENT: Negative for drooling and tinnitus.   Eyes: Negative for photophobia and visual disturbance.  Respiratory: Negative for choking and stridor.   Gastrointestinal: Negative for vomiting and blood in stool.  Genitourinary: Negative for hematuria and decreased urine volume.    Objective:   Physical Exam BP 132/68  Pulse 62  Temp(Src) 97.6 F (36.4 C) (Oral)  Wt 155 lb (70.308 kg)  SpO2 95% Physical Exam  VS noted,not ill appearing Constitutional: Pt appears well-developed and well-nourished.  HENT: Head: Normocephalic.  Right Ear: External ear normal.  Left Ear: External ear normal.  Bilat tm's mild erythema.  Sinus nontender.  Pharynx mild erythema Eyes: Conjunctivae and EOM are normal. Pupils are equal, round, and reactive to light.  Neck: Normal range of motion. Neck supple.  Cardiovascular: Normal rate and regular rhythm.   Pulmonary/Chest: Effort normal and breath sounds normal.  Abd:  Soft, NT, non-distended, + BS Neurological: Pt is alert. No cranial nerve deficit.  Skin: Skin  is warm. No erythema.  Psychiatric: Pt behavior is normal. Thought content normal.     Assessment & Plan:

## 2011-01-25 NOTE — Assessment & Plan Note (Signed)
Rather marked symtpoms, for depomedrol IM today, gave 2 wks samples dimista with instructions for use, and rx for allegra/flonase if needed further after,  to f/u any worsening symptoms or concerns, consider allergy testing

## 2011-01-25 NOTE — Patient Instructions (Signed)
You are given the steroid shot today Please start the Dymista at 1 spray twice per day If your symptoms are improved in 2 wks, you may not need further medication, but if you do you are given the allegra and flonase as well Continue all other medications as before

## 2011-01-25 NOTE — Assessment & Plan Note (Signed)
stable overall by hx and exam, most recent data reviewed with pt, and pt to continue medical treatment as before  BP Readings from Last 3 Encounters:  01/25/11 132/68  07/22/10 124/68  06/22/10 112/62

## 2011-03-20 DIAGNOSIS — B351 Tinea unguium: Secondary | ICD-10-CM | POA: Diagnosis not present

## 2011-03-20 DIAGNOSIS — M79609 Pain in unspecified limb: Secondary | ICD-10-CM | POA: Diagnosis not present

## 2011-04-01 ENCOUNTER — Other Ambulatory Visit: Payer: Self-pay | Admitting: Internal Medicine

## 2011-05-09 ENCOUNTER — Encounter: Payer: Self-pay | Admitting: Internal Medicine

## 2011-05-09 ENCOUNTER — Inpatient Hospital Stay (HOSPITAL_COMMUNITY)
Admission: AD | Admit: 2011-05-09 | Discharge: 2011-05-13 | DRG: 195 | Disposition: A | Discharge status: Home / Self Care | Payer: Medicare Other | Source: Ambulatory Visit | Attending: Internal Medicine | Admitting: Internal Medicine

## 2011-05-09 ENCOUNTER — Ambulatory Visit (INDEPENDENT_AMBULATORY_CARE_PROVIDER_SITE_OTHER)
Admission: RE | Admit: 2011-05-09 | Discharge: 2011-05-09 | Disposition: A | Discharge status: Home / Self Care | Payer: Medicare Other | Source: Ambulatory Visit | Attending: Internal Medicine | Admitting: Internal Medicine

## 2011-05-09 ENCOUNTER — Other Ambulatory Visit (INDEPENDENT_AMBULATORY_CARE_PROVIDER_SITE_OTHER): Payer: Medicare Other

## 2011-05-09 ENCOUNTER — Encounter (HOSPITAL_COMMUNITY): Payer: Self-pay | Admitting: *Deleted

## 2011-05-09 ENCOUNTER — Ambulatory Visit (INDEPENDENT_AMBULATORY_CARE_PROVIDER_SITE_OTHER): Payer: Medicare Other | Admitting: Internal Medicine

## 2011-05-09 ENCOUNTER — Telehealth: Payer: Self-pay | Admitting: Internal Medicine

## 2011-05-09 VITALS — BP 112/58 | HR 59 | Temp 96.6°F | Resp 16 | Wt 153.2 lb

## 2011-05-09 DIAGNOSIS — R059 Cough, unspecified: Secondary | ICD-10-CM

## 2011-05-09 DIAGNOSIS — I509 Heart failure, unspecified: Secondary | ICD-10-CM | POA: Diagnosis present

## 2011-05-09 DIAGNOSIS — J189 Pneumonia, unspecified organism: Secondary | ICD-10-CM | POA: Diagnosis not present

## 2011-05-09 DIAGNOSIS — R0609 Other forms of dyspnea: Secondary | ICD-10-CM | POA: Diagnosis not present

## 2011-05-09 DIAGNOSIS — I517 Cardiomegaly: Secondary | ICD-10-CM | POA: Diagnosis not present

## 2011-05-09 DIAGNOSIS — R05 Cough: Secondary | ICD-10-CM

## 2011-05-09 DIAGNOSIS — J438 Other emphysema: Secondary | ICD-10-CM | POA: Diagnosis not present

## 2011-05-09 DIAGNOSIS — I359 Nonrheumatic aortic valve disorder, unspecified: Secondary | ICD-10-CM | POA: Diagnosis not present

## 2011-05-09 DIAGNOSIS — R0902 Hypoxemia: Secondary | ICD-10-CM

## 2011-05-09 DIAGNOSIS — R0989 Other specified symptoms and signs involving the circulatory and respiratory systems: Secondary | ICD-10-CM | POA: Diagnosis not present

## 2011-05-09 DIAGNOSIS — R918 Other nonspecific abnormal finding of lung field: Secondary | ICD-10-CM | POA: Diagnosis not present

## 2011-05-09 DIAGNOSIS — Z66 Do not resuscitate: Secondary | ICD-10-CM | POA: Diagnosis present

## 2011-05-09 DIAGNOSIS — I10 Essential (primary) hypertension: Secondary | ICD-10-CM | POA: Diagnosis present

## 2011-05-09 DIAGNOSIS — R0602 Shortness of breath: Secondary | ICD-10-CM

## 2011-05-09 DIAGNOSIS — J984 Other disorders of lung: Secondary | ICD-10-CM | POA: Diagnosis not present

## 2011-05-09 LAB — BASIC METABOLIC PANEL
BUN: 17 mg/dL (ref 6–23)
Calcium: 8.7 mg/dL (ref 8.4–10.5)
GFR: 54.97 mL/min — ABNORMAL LOW (ref >60.00)
Potassium: 3.7 mEq/L (ref 3.5–5.1)

## 2011-05-09 LAB — CBC WITH DIFFERENTIAL/PLATELET
Basophils Absolute: 0 10*3/uL (ref 0.0–0.1)
Eosinophils Absolute: 0 10*3/uL (ref 0.0–0.7)
Hemoglobin: 11.1 g/dL — ABNORMAL LOW (ref 12.0–15.0)
Lymphocytes Relative: 14.8 % (ref 12.0–46.0)
MCHC: 32.3 g/dL (ref 30.0–36.0)
Monocytes Absolute: 0.7 10*3/uL (ref 0.1–1.0)
Neutro Abs: 6.7 10*3/uL (ref 1.4–7.7)
Neutrophils Relative %: 76.7 % (ref 43.0–77.0)
RDW: 15.2 % — ABNORMAL HIGH (ref 11.5–14.6)

## 2011-05-09 MED ORDER — ASPIRIN 325 MG PO TABS
325.0000 mg | ORAL_TABLET | Freq: Every day | ORAL | Status: DC
  Administered 2011-05-09 – 2011-05-13 (×5): 325 mg via ORAL
  Filled 2011-05-09 (×5): qty 1

## 2011-05-09 MED ORDER — DILTIAZEM HCL ER BEADS 180 MG PO CP24: 180.0000 mg | ORAL_CAPSULE | Freq: Every day | ORAL | Status: DC

## 2011-05-09 MED ORDER — DILTIAZEM HCL ER COATED BEADS 180 MG PO CP24
180.0000 mg | ORAL_CAPSULE | Freq: Every day | ORAL | Status: DC
  Administered 2011-05-09 – 2011-05-13 (×5): 180 mg via ORAL
  Filled 2011-05-09 (×5): qty 1

## 2011-05-09 MED ORDER — MOXIFLOXACIN HCL 400 MG PO TABS
400.0000 mg | ORAL_TABLET | Freq: Every day | ORAL | Status: DC
  Administered 2011-05-10 – 2011-05-12 (×3): 400 mg via ORAL
  Filled 2011-05-09 (×4): qty 1

## 2011-05-09 MED ORDER — METHIMAZOLE 5 MG PO TABS
5.0000 mg | ORAL_TABLET | Freq: Every day | ORAL | Status: DC
  Administered 2011-05-09 – 2011-05-13 (×5): 5 mg via ORAL
  Filled 2011-05-09 (×5): qty 1

## 2011-05-09 MED ORDER — LABETALOL HCL 200 MG PO TABS
200.0000 mg | ORAL_TABLET | Freq: Every day | ORAL | Status: DC
  Administered 2011-05-10 – 2011-05-13 (×4): 200 mg via ORAL
  Filled 2011-05-09 (×5): qty 1

## 2011-05-09 MED ORDER — AZITHROMYCIN 500 MG PO TABS
500.0000 mg | ORAL_TABLET | Freq: Every day | ORAL | Status: AC
  Administered 2011-05-09 – 2011-05-11 (×3): 500 mg via ORAL
  Filled 2011-05-09 (×3): qty 1

## 2011-05-09 MED ORDER — ENOXAPARIN SODIUM 30 MG/0.3ML ~~LOC~~ SOLN
30.0000 mg | SUBCUTANEOUS | Status: DC
  Administered 2011-05-09 – 2011-05-12 (×4): 30 mg via SUBCUTANEOUS
  Filled 2011-05-09 (×5): qty 0.3

## 2011-05-09 MED ORDER — PROMETHAZINE-CODEINE 6.25-10 MG/5ML PO SYRP
5.0000 mL | ORAL_SOLUTION | Freq: Four times a day (QID) | ORAL | Status: DC | PRN
  Administered 2011-05-10: 2.5 mL via ORAL
  Administered 2011-05-11 – 2011-05-12 (×2): 5 mL via ORAL
  Filled 2011-05-09 (×3): qty 5

## 2011-05-09 MED ORDER — FUROSEMIDE 40 MG PO TABS
40.0000 mg | ORAL_TABLET | Freq: Two times a day (BID) | ORAL | Status: DC
  Administered 2011-05-09 – 2011-05-13 (×8): 40 mg via ORAL
  Filled 2011-05-09 (×9): qty 1

## 2011-05-09 NOTE — Patient Instructions (Signed)
Respiratory distress with low oxygen level. Lungs sound congested. Plan - chest xray now; blood count and BNP now.            If it is safe to treat as an outpatient an antibiotic and cough syrup will be called in for you.            If it is safest to admit to hospital we will have you go from home to hospital when they call with a bed assignment (hopefully we will dodge this bullet)            If you stay at home in addition to medications: tylenol 500 mg three times a day, hydrate well, vitamin C  And close follow-up - Thursday.

## 2011-05-09 NOTE — H&P (Signed)
Danielle Adams is an 78 y.o. female.   Chief Complaint: Persistent cough with increasing SOB/DOE HPI: Danielle Adams, a delightful and very sharp nanogenarian, presented to the office with a week long h/o cough, occasionally productive of colored sputum. She reports malaise and fatigue. She reports DOE when moving about the house from room to room. She denies any chest pain or discomfort, peripheral edema, N/V, sinus congestion. She does take furosemide for BP management. There is no report of peripheral edema. In the office she was afebrile, O2 sat 90% on RA, lungs with diffuse rhonchi, mild increased work of breathing. CXR with new RLL infiltrate, WBC normal with normal differential however, in the very elderly exam findings, radiographic findings are more reliable indication of signs of pneumonia. In addition, BNP was mildly elevated suggestive of mild fluid retention/pulmonary edema. She is now admitted for antibiosis, oxygen support, increased diuresis and 2 D echo.  Past Medical History  Diagnosis Date  . Rapid heart rate     arrythmia  . HTN (hypertension)   . Familial tremor   . Allergic rhinitis, cause unspecified 01/25/2011    Past Surgical History  Procedure Date  . Tonsillectomy   . Dilation and curettage of uterus   . Bunionectomy   . Laparoscopic ovarian cystectomy 04/2009    Family History  Problem Relation Age of Onset  . Pancreatic cancer Mother   . Coronary artery disease Father   . Diabetes Sister   . Diabetes Brother    Social History:  reports that she has never smoked. She does not have any smokeless tobacco history on file. Her alcohol and drug histories not on file. [employer]1 year business schoolMarried 1948-widowed [phone] daughters, 7 grandchildren, 2 great-grandchildren. Work: Systems analyst, retired Engineering geologist. Just moved to [facility] August '08-living with daughter. Terminal care-does Not want cardiac resuscitation or mechanical ventilation, artifical nutrition or hydration,  renal dialysis or major heroic intervention. Laymen's Guide with forms provided.    Allergies: No Known Allergies  No current facility-administered medications on file as of 05/09/2011.   Medications Prior to Admission  Medication Sig Dispense Refill  . aspirin 325 MG tablet Take 325 mg by mouth daily.       . Calcium Carbonate-Vitamin D (CALTRATE 600+D) 600-400 MG-UNIT per tablet Take 1 tablet by mouth 2 (two) times daily.        Marland Kitchen diltiazem (TIAZAC) 180 MG 24 hr capsule Take 1 capsule (180 mg total) by mouth daily.  30 capsule  11  . docusate sodium (COLACE) 100 MG capsule Take 1 capsule (100 mg total) by mouth as needed. Stool Softener  10 capsule  3  . furosemide (LASIX) 40 MG tablet Take 1 tablet (40 mg total) by mouth daily.  90 tablet  3  . hydrocortisone-pramoxine (ANALPRAM-HC) 2.5-1 % rectal cream PLACE RECTALLY TWICE DAILY  30 g  1  . labetalol (NORMODYNE) 200 MG tablet TAKE ONE TABLET BY MOUTH TWICE DAILY  120 tablet  0  . methimazole (TAPAZOLE) 5 MG tablet TAKE ONE (1) TABLET(S) ONCE DAILY  90 tablet  2  . Multiple Vitamins-Minerals (EYE VITAMINS) CAPS Take 1 each by mouth daily.        . OMEGA 3 1000 MG CAPS Take 1 capsule by mouth 2 (two) times daily.         Results for orders placed in visit on 05/09/11 (from the past 48 hour(s))  CBC WITH DIFFERENTIAL     Status: Abnormal   Collection Time   05/09/11  2:21 PM      Component Value Range Comment   WBC 8.8  4.5 - 10.5 (K/uL)    RBC 4.00  3.87 - 5.11 (Mil/uL)    Hemoglobin 11.1 (*) 12.0 - 15.0 (g/dL)    HCT 21.3 (*) 08.6 - 46.0 (%)    MCV 85.6  78.0 - 100.0 (fl)    MCHC 32.3  30.0 - 36.0 (g/dL)    RDW 57.8 (*) 46.9 - 14.6 (%)    Platelets 187.0  150.0 - 400.0 (K/uL)    Neutrophils Relative 76.7  43.0 - 77.0 (%)    Lymphocytes Relative 14.8  12.0 - 46.0 (%)    Monocytes Relative 7.8  3.0 - 12.0 (%)    Eosinophils Relative 0.3  0.0 - 5.0 (%)    Basophils Relative 0.4  0.0 - 3.0 (%)    Neutro Abs 6.7  1.4 - 7.7 (K/uL)     Lymphs Abs 1.3  0.7 - 4.0 (K/uL)    Monocytes Absolute 0.7  0.1 - 1.0 (K/uL)    Eosinophils Absolute 0.0  0.0 - 0.7 (K/uL)    Basophils Absolute 0.0  0.0 - 0.1 (K/uL)   BRAIN NATRIURETIC PEPTIDE     Status: Abnormal   Collection Time   05/09/11  2:21 PM      Component Value Range Comment   Pro B Natriuretic peptide (BNP) 693.0 (*) 0.0 - 100.0 (pg/mL)   BASIC METABOLIC PANEL     Status: Abnormal   Collection Time   05/09/11  2:21 PM      Component Value Range Comment   Sodium 140  135 - 145 (mEq/L)    Potassium 3.7  3.5 - 5.1 (mEq/L)    Chloride 103  96 - 112 (mEq/L)    CO2 27  19 - 32 (mEq/L)    Glucose, Bld 158 (*) 70 - 99 (mg/dL)    BUN 17  6 - 23 (mg/dL)    Creatinine, Ser 1.0  0.4 - 1.2 (mg/dL)    Calcium 8.7  8.4 - 10.5 (mg/dL)    GFR 62.95 (*) >28.41 (mL/min)    Dg Chest 2 View  05/09/2011  *RADIOLOGY REPORT*  Clinical Data: Cough and shortness of breath.  CHEST - 2 VIEW  Comparison: Two-view chest 06/03/2009.  Findings: Cardiac enlargement is stable.  Emphysematous changes are noted.  New right lower lobe airspace disease is concerning for pneumonia.  Atherosclerotic calcifications are present at the aortic arch.  Degenerative changes are present in the thoracic spine.  IMPRESSION:  1.  New right lower lobe airspace disease is concerning for pneumonia. 2.  Emphysema. 3.  Stable cardiomegaly without failure. 4.  Atherosclerosis.  Original Report Authenticated By: Jamesetta Orleans. MATTERN, M.D.    Review of Systems  Constitutional: Positive for chills and malaise/fatigue. Negative for fever.  HENT: Positive for congestion. Negative for hearing loss and tinnitus.   Eyes: Negative.   Respiratory: Positive for cough, sputum production and shortness of breath. Negative for hemoptysis and wheezing.   Cardiovascular: Negative.   Gastrointestinal: Negative.   Genitourinary: Negative.   Musculoskeletal: Negative.   Skin: Negative.   Neurological: Positive for tremors and weakness. Negative  for headaches.  Endo/Heme/Allergies: Negative.   Psychiatric/Behavioral: Negative.     Blood pressure 163/80, pulse 65, temperature 97.7 F (36.5 C), temperature source Oral, resp. rate 16, SpO2 92.00%. Physical Exam  Gen'l;- Elderly white woman in no acute distress HEENT- C&S clear, oropharynx normal Neck - supple, w/o thyromegaly Nodes -  negative in cervical and supraclavicular region Cor - 2+ radial pulse, no JVD, RRR with PVCs Pulm - mild increased WOB, diffuse rhonchi, feint rales at bases, no wheezing Breast - deferred Abd-soft, BS+ Pelvic/rectal deferred Neuro - A&O x 3, CN II-XII normal, resting tremor (chronic finding) Derm - no skin lesions head, neck, arms, upper chest. Assessment/Plan 1. CAP - elderly woman with cough, SOB/DOE, increased respiratory rate and RLL infiltrate on 2 view CXR very suggestive of CAP. In a 78y/o absence of fever or leukocytosis does not r/o PNA. Plan - oral antibiotic coverage with Avelox 400 mg once a day and azithromycin 500 mg daily x 3           Oxygen support           F/u CXR in 24 hrs  2. Cardiac - patient with mildly elevated pBNP and rales on exam suggestive of mild pulmonary edema Plan - increase lasix to 40mg  bid           Daily weights and I&O           2 D echo  3. HTN- mildly elevated BP at admission. Plan - continue home medications.  4. Code status - no DNR/DNI, no heroic measures.   Illene Regulus 05/09/2011, 5:51 PM

## 2011-05-09 NOTE — Telephone Encounter (Signed)
The pt's daughter, Aram Beecham, called and is hoping to be worked in for general weakness.  She states the patient is feeling fatigued and a slight cold.  Can she be worked in today or tomorrow?  Thank you!  [phone]

## 2011-05-09 NOTE — Progress Notes (Signed)
  Subjective:    Patient ID: Danielle Adams, female    DOB: 09-28-1916, 78 y.o.   MRN: [id number]  HPI Danielle Adams presents with a week h/o persistent cough that is occasionally productive of colored phlegm, increased weakness and fatigue and DOE - cannot go from one end of the house to the other without resting. She has had no fever or chills.  Past Medical History  Diagnosis Date  . Rapid heart rate     arrythmia  . HTN (hypertension)   . Familial tremor   . Allergic rhinitis, cause unspecified 01/25/2011   Past Surgical History  Procedure Date  . Tonsillectomy   . Dilation and curettage of uterus   . Bunionectomy   . Laparoscopic ovarian cystectomy 04/2009   Family History  Problem Relation Age of Onset  . Pancreatic cancer Mother   . Coronary artery disease Father   . Diabetes Sister   . Diabetes Brother    History   Social History  . Marital Status: Widowed    Spouse Name: N/A    Number of Children: N/A  . Years of Education: N/A   Occupational History  . Not on file.   Social History Main Topics  . Smoking status: Never Smoker   . Smokeless tobacco: Not on file  . Alcohol Use: Not on file  . Drug Use: Not on file  . Sexually Active: Not on file   Other Topics Concern  . Not on file   Social History Narrative   [employer]. 1 year business school. Married 1948-widowed 1981. 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 Just moved to [facility] August '08-living with daughter. Terminal care-does Not want cardiac resuscitation or mechanical ventilation, artifical nutrition or hydration, renal dialysis or major heroic intervention. Laymen's Guide with forms provided.       Review of Systems System review is negative for any constitutional, cardiac, pulmonary, GI or neuro symptoms or complaints other than as described in the HPI.     Objective:   Physical Exam Filed Vitals:   05/09/11 1324  BP: 112/58  Pulse: 59  Temp:  96.6 F (35.9 C)  Resp: 16   Oxygen sat 90% RA Gen'l- elderly white woman alert and awake HEENT - C&S clear Neck - supple Cor - 2+ radial pulse, RRR Chest - decreased breath sounds, mild increased WOB, diffuse rhonchi, positive egophony. No wheezing is appreciated Neuro - A&O x 3, tremor diffuse, generalized weakness       Assessment & Plan:  Cough and SOB - worried for CAP in the very elderly vs CHF Plan - CXR, CBCD, BNP           If no significant abnormalities will treat as outpatient: oral antibiotics and prom/cod for cough           For marked pulmonary congestion or infiltrate, elevated BNP or WBC with left shift will admit to hospital.

## 2011-05-09 NOTE — Telephone Encounter (Signed)
Work in this PM is good

## 2011-05-10 ENCOUNTER — Inpatient Hospital Stay (HOSPITAL_COMMUNITY): Payer: Medicare Other

## 2011-05-10 DIAGNOSIS — I359 Nonrheumatic aortic valve disorder, unspecified: Secondary | ICD-10-CM

## 2011-05-10 LAB — EXPECTORATED SPUTUM ASSESSMENT W GRAM STAIN, RFLX TO RESP C

## 2011-05-10 NOTE — Progress Notes (Signed)
  Echocardiogram 2D Echocardiogram has been performed.  Danielle Adams [facility] 05/10/2011, 10:17 AM

## 2011-05-10 NOTE — Progress Notes (Signed)
Subjective: Easily awakend. She continues to have a wet cough. On O2 she does not feel short of breath. She was able to slee;  Objective: Lab: no new lab  Imaging: no new imaging   Physical Exam: Filed Vitals:   05/10/11 0502  BP: 124/64  Pulse: 82  Temp: 98 F (36.7 C)  Resp: 20  gen'l - elderly white woman in no distress but with a wet cough Chest - coarse rhonchi diffusely, question of rales at bases Cor - RRR Abd - soft Neuro - A&O x 3, non-focal exam.     Assessment/Plan: 1. CAP - day # Avelox and Azithromycin. Persistent abnormal lung sounds Plan - continue antibiotic            Continue oxygen            PA-LAT CXR this PM - progression of disease 2. Cardiac - hemodynamically stable. 2 D echo pending  3. HTN - stable   Illene Regulus 05/10/2011, 7:29 AM

## 2011-05-11 NOTE — Progress Notes (Signed)
Subjective: Danielle Adams reports continued cough and some SOB. No pain or discomfort.  Objective: Lab:  Imaging: 2 D echo done: Impressions:  - Normal LV size and systolic function, EF 55-60%. Normal RV size and systolic function. Moderate biatrial enlargement. Mild mitral regurgitation. Mild aortic regurgitation and mild aortic stenosis.     Physical Exam: Filed Vitals:   05/11/11 0605  BP: 123/54  Pulse: 62  Temp: 98.9 F (37.2 C)  Resp: 18    Intake/Output Summary (Last 24 hours) at 05/11/11 0817 Last data filed at 05/10/11 1500  Gross per 24 hour  Intake    480 ml  Output      0 ml  Net    480 ml   Gen'l very pleasant women in no distress sitting up eating breakfast HENT- C&S clear Cor - RRR Pulm - no increased WOB, coarse rhonchi both bases, wet rhonchi with expiratory wheeze right base Neuor - A&O x 3     Assessment/Plan: 1. CAP - Day # 3 Avelox/Azithromycin. No fever. Continue hypoxemia with O2 sat 87% RA at rest. Plan - continue Avelox, Azithromycin is 3/3           Continue O2            IS 5 min every hour            Flutter valve 5 min twice an hour while awake            Ambulate  2. Cardiac - no diuresis by I&O. Plan - continue lasix bid            Daily weights starting today            Bmet in AM  3. HTN- stable   Danielle Adams 05/11/2011, 8:16 AM

## 2011-05-12 LAB — BASIC METABOLIC PANEL
BUN: 18 mg/dL (ref 6–23)
Chloride: 106 mEq/L (ref 96–112)
Glucose, Bld: 97 mg/dL (ref 70–99)
Potassium: 3.1 mEq/L — ABNORMAL LOW (ref 3.5–5.1)

## 2011-05-12 NOTE — Progress Notes (Signed)
Subjective: Feeling better. She reports getting short of breath with walking. Has not had much sputum production despite using IS and flutter valve.  Objective: Lab: BMET    Component Value Date/Time   NA 143 05/12/2011 0425   K 3.1* 05/12/2011 0425   CL 106 05/12/2011 0425   CO2 28 05/12/2011 0425   GLUCOSE 97 05/12/2011 0425   BUN 18 05/12/2011 0425   CREATININE 0.95 05/12/2011 0425   CALCIUM 8.8 05/12/2011 0425   GFRNONAA 50* 05/12/2011 0425   GFRAA 58* 05/12/2011 0425     Imaging: No new imaging  Physical Exam: Filed Vitals:   05/12/11 0502  BP: 117/53  Pulse: 66  Temp: 98.4 F (36.9 C)  Resp: 18  O2 sat 95% 2 l Sitting on the sitd of the bed working a puzzle Chest - coarse rhonchi throughout, better breath sounds at the right base Cor - RRR     Assessment/Plan: 1. CAP - 3/3 azithromycin 4/7 avelox. Still on oxygen Plan - ambulate off oxygen and record  IF Oxygen level is maintained with ambulation will plan on D/C hoome in the AM   Danielle Adams 05/12/2011, 10:19 AM

## 2011-05-13 DIAGNOSIS — J189 Pneumonia, unspecified organism: Secondary | ICD-10-CM

## 2011-05-13 MED ORDER — MOXIFLOXACIN HCL 400 MG PO TABS: 400.0000 mg | ORAL_TABLET | Freq: Every day | ORAL | Status: AC

## 2011-05-13 MED ORDER — PROMETHAZINE-CODEINE 6.25-10 MG/5ML PO SYRP: 5.0000 mL | ORAL_SOLUTION | Freq: Four times a day (QID) | ORAL | Status: AC | PRN

## 2011-05-13 NOTE — Progress Notes (Signed)
Patient discharged with family via wheelchair.  Discharge instructions and prescription given to patient.  No change from morning assessment.

## 2011-05-13 NOTE — Progress Notes (Signed)
Patient is doing well. Reports breathing is better. Reports being able to ambulate without DOE  For d/c home. Office follow-up Tuesday, April 9th 1 PM  Dictated #[id number]

## 2011-05-14 NOTE — Discharge Summary (Signed)
NAMEMARYANNE, HUNEYCUTT               ACCOUNT NO.:  [id number]  MEDICAL RECORD NO.:  [id number]  LOCATION:  1343                         FACILITY:  [facility]  PHYSICIAN:  Rosalyn Gess. Annely Sliva, MD  DATE OF BIRTH:  09-06-1916  DATE OF ADMISSION:  05/09/2011 DATE OF DISCHARGE:  05/13/2011                              DISCHARGE SUMMARY   ADMITTING DIAGNOSIS:  Right lower lobe pneumonia.  DISCHARGE DIAGNOSIS:  Right lower lobe pneumonia.  CONSULTANTS:  None.  PROCEDURES:  PA and lateral chest x-ray done as an outpatient, which revealed a right lower lobe infiltrate.  HISTORY OF PRESENT ILLNESS:  Danielle Adams is a delightful 78 year old woman, very independent, living alone, managing all of her ADL.  She presented to the office on Tuesday because of increasing shortness of breath, dyspnea on exertion, and persistent cough that was productive of scant purulent sputum.  Chest x-ray was obtained at the office as well as the CBC which returned with normal white count, but right lower lobe infiltrate was noted on x-ray.  Given her advanced age, with senescence of the immune system, explaining the lack of fever and leukocytosis, she was admitted because of an acute right lower lobe pneumonia.  Please see the H and P for past medical history, family history, social history, and physical exam.  HOSPITAL COURSE:  The patient was treated with azithromycin 500 mg q. day x3 days, Avelox 400 mg daily.  She was put on oxygen due to an oxygen saturation of 90% in the office on room air.  1. The patient did well on this regimen.  She had improvement in her     room air oxygen saturation.  She was started on incentive     spirometer, as well as flutter valve to help clear her secretions     and maintain good pulmonary hygiene.  By the time of discharge, she     had been able to ambulate without the use of oxygen without getting     short of breath.  She had decreased cough.  At this point, she is     stable  and ready for discharge home.  She will complete an     additional 5 days of Avelox.  She will continue incentive     spirometry and flutter valve treatments. 2. Mild CHF.  The patient did have a mild elevation in her BNP.  Her     Lasix was increased to 40 mg b.i.d.  On this regimen, the patient     seemed to be stable. Her I's and O's were positive 1 L for the     total with admission. Weights were not recorded.  The patient did     seem to be doing well with no distress.  She will continue on Lasix     40 mg daily.  DISCHARGE EXAMINATION:  VITAL SIGNS:  Temperature was 98.8, blood pressure 117/65, oxygen saturation was 93% on room air, pulse was 64. Her heart rate was 18. GENERAL APPEARANCE:  The patient is sitting up in a chair dressed and ready to go. HEENT EXAM:  The patient has minimal periorbital edema.  Conjunctivae and sclerae were clear.  NECK:  Supple. CHEST:  The patient has some residual rhonchi at the right base, but improved from admission.  There is no wheezing. CARDIOVASCULAR:  The patient's precordium was quiet.  She had a regular rate and rhythm. NEUROLOGIC:  The patient has significant familial tremor, which is stable.  FINAL LABORATORY:  The patient had a CBC at time of admission, with white count of 8800, hemoglobin 11.1 g, platelet count 187,000 with a normal differential.  The patient was given Lasix during this admission and therefore, had a and BMET on May 12, 2011, which revealed a sodium 143, potassium 3.1, chloride 106, CO2 of 28, BUN of 18, creatinine 0.95, glucose was 97.  The patient had a BNP at admission which was 693 and was not repeated.  DISPOSITION:  The patient is to be discharged home.  The patient will be continued on Avelox daily for 5 additional days.  The patient should continue with flutter valve treatments every 2 hours and she will use her incentive spirometry every hour while awake.  There are no limitations in her activities.  The  patient will resume all of her home medications.  FOLLOWUP:  The patient will be seen in the office on Tuesday, May 16, 2011 at 1 p.m. and the patient is aware of this appointment.  CONDITION:  At time of discharge dictation is significantly improved, but guarded given her very advanced age.  Thank you very much for your help with this dictation.     Rosalyn Gess Javonte Elenes, MD     MEN/MEDQ  D:  05/13/2011  T:  05/14/2011  Job:  [id number]

## 2011-05-16 ENCOUNTER — Ambulatory Visit (INDEPENDENT_AMBULATORY_CARE_PROVIDER_SITE_OTHER): Payer: Medicare Other | Admitting: Internal Medicine

## 2011-05-16 ENCOUNTER — Encounter: Payer: Self-pay | Admitting: Internal Medicine

## 2011-05-16 VITALS — BP 102/58 | HR 58 | Temp 98.0°F | Resp 14 | Wt 153.0 lb

## 2011-05-16 DIAGNOSIS — J189 Pneumonia, unspecified organism: Secondary | ICD-10-CM

## 2011-05-16 NOTE — Progress Notes (Signed)
  Subjective:    Patient ID: Danielle Adams, female    DOB: 12-Sep-1916, 78 y.o.   MRN: [id number]  HPI Danielle Adams was hospitalized for RLL infiltrate/CAP. Hospital records reviewed.She did well on po Avelox and oxygen support. She was discharged April 6th and in the interval has done well. She continues to use the flutter valve and IS. Sputum production is less and color is now yellow. She has no DOE and is fully independent in ADLs. She has a good appetite.  PMH, FamHx and SocHx reviewed for any changes and relevance. Current Outpatient Prescriptions on File Prior to Visit  Medication Sig Dispense Refill  . aspirin 325 MG tablet Take 325 mg by mouth daily.       . Calcium Carbonate-Vitamin D (CALTRATE 600+D) 600-400 MG-UNIT per tablet Take 1 tablet by mouth 2 (two) times daily.        Marland Kitchen diltiazem (TIAZAC) 180 MG 24 hr capsule Take 1 capsule (180 mg total) by mouth daily.  30 capsule  11  . docusate sodium (COLACE) 100 MG capsule Take 1 capsule (100 mg total) by mouth as needed. Stool Softener  10 capsule  3  . furosemide (LASIX) 40 MG tablet Take 1 tablet (40 mg total) by mouth daily.  90 tablet  3  . hydrocortisone-pramoxine (ANALPRAM-HC) 2.5-1 % rectal cream PLACE RECTALLY TWICE DAILY  30 g  1  . labetalol (NORMODYNE) 200 MG tablet TAKE ONE TABLET BY MOUTH TWICE DAILY  120 tablet  0  . methimazole (TAPAZOLE) 5 MG tablet TAKE ONE (1) TABLET(S) ONCE DAILY  90 tablet  2  . moxifloxacin (AVELOX) 400 MG tablet Take 1 tablet (400 mg total) by mouth daily at 6 PM.  5 tablet  0  . Multiple Vitamins-Minerals (EYE VITAMINS) CAPS Take 1 each by mouth daily.        . OMEGA 3 1000 MG CAPS Take 1 capsule by mouth 2 (two) times daily.       . promethazine-codeine (PHENERGAN WITH CODEINE) 6.25-10 MG/5ML syrup Take 5 mLs by mouth every 6 (six) hours as needed for cough.  120 mL  1     Review of Systems System review is negative for any constitutional, cardiac, pulmonary, GI or neuro symptoms or complaints  other than as described in the HPI.    Objective:   Physical Exam Filed Vitals:   05/16/11 1355  BP: 102/58  Pulse: 58  Temp: 98 F (36.7 C)  Resp: 14   Gen'l- elderly white woman with familial tremor who is bright and alert. Cor - RRR Pulm - normal respirations. Lungs are clear with no rales or wheezing Neuro - A&O x3       Assessment & Plan:  CAP - has done very well.  Plan - complete Avelox           Stop flutter valve and IS when antibiotics are complete.           ROV prn

## 2011-06-13 ENCOUNTER — Other Ambulatory Visit: Payer: Self-pay | Admitting: *Deleted

## 2011-06-13 MED ORDER — LABETALOL HCL 200 MG PO TABS: 200.0000 mg | ORAL_TABLET | Freq: Two times a day (BID) | ORAL | Status: DC

## 2011-07-11 ENCOUNTER — Telehealth: Payer: Self-pay | Admitting: Internal Medicine

## 2011-07-11 NOTE — Telephone Encounter (Signed)
Ok to add on weds 6/5

## 2011-07-11 NOTE — Telephone Encounter (Signed)
Pt's daughter called and is concerned the pt has developed a cough and congestion.  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  Do you want her added?  Thanks!   [phone] Claris Che)

## 2011-07-12 ENCOUNTER — Telehealth: Payer: Self-pay | Admitting: Internal Medicine

## 2011-07-12 ENCOUNTER — Other Ambulatory Visit: Payer: Self-pay | Admitting: Internal Medicine

## 2011-07-12 ENCOUNTER — Ambulatory Visit (INDEPENDENT_AMBULATORY_CARE_PROVIDER_SITE_OTHER): Payer: Medicare Other | Admitting: Internal Medicine

## 2011-07-12 ENCOUNTER — Encounter: Payer: Self-pay | Admitting: Internal Medicine

## 2011-07-12 VITALS — BP 152/70 | HR 68 | Temp 97.6°F | Resp 16 | Wt 152.0 lb

## 2011-07-12 DIAGNOSIS — R05 Cough: Secondary | ICD-10-CM

## 2011-07-12 MED ORDER — PREDNISONE 10 MG PO TABS: 10.0000 mg | ORAL_TABLET | Freq: Every day | ORAL | Status: DC

## 2011-07-12 MED ORDER — BENZONATATE 100 MG PO CAPS: 100.0000 mg | ORAL_CAPSULE | Freq: Three times a day (TID) | ORAL | Status: AC

## 2011-07-12 MED ORDER — PHENYLEPH-PROMETHAZINE-COD 5-6.25-10 MG/5ML PO SYRP: 5.0000 mL | ORAL_SOLUTION | Freq: Three times a day (TID) | ORAL | Status: DC

## 2011-07-12 NOTE — Patient Instructions (Signed)
Cyclical cough - tracheal irritation that causes cough that causes tracheal irritation that causes cough, etc. Plan: tessalon perle three times a day; phenergan with codeine cough syrup 1 tsp three times a day; prednisone 10 mg daily  Hoarseness - may be due to post-nasal drainage.  Plan - over the counter non-sdedating antihistamine, e.g. Claritin 10 mg once a day.   Call if the cough or hoarseness does not get better.

## 2011-07-12 NOTE — Telephone Encounter (Signed)
Called pt, no answer, lvmom.  Explained Dr.Norins wanted to make sure she was okay since she has missed her appt.

## 2011-07-13 NOTE — Telephone Encounter (Signed)
Medications sent to [facility] drugs

## 2011-07-16 NOTE — Progress Notes (Signed)
  Subjective:    Patient ID: Danielle Adams, female    DOB: January 07, 1917, 78 y.o.   MRN: [id number]  HPI Mrs. Violante presents for evaluation of a prolonged cough. She reports have a sore throat approx 3 weeks ago that last for 1 week and resolved spontaneously. This was accompanied by a dry, non-productive cough which has persisted. Two days ago she developed hoarseness in addition to the cough. She has not had any fever, SOB, increased WOB, N/V/D. She denies any sinus tenderness but does admit to some post-nasal drainage.   Past Medical History  Diagnosis Date  . Rapid heart rate     arrythmia  . HTN (hypertension)   . Familial tremor   . Allergic rhinitis, cause unspecified 01/25/2011   Past Surgical History  Procedure Date  . Tonsillectomy   . Dilation and curettage of uterus   . Bunionectomy   . Laparoscopic ovarian cystectomy 04/2009   Family History  Problem Relation Age of Onset  . Pancreatic cancer Mother   . Coronary artery disease Father   . Diabetes Sister   . Diabetes Brother    History   Social History  . Marital Status: Widowed    Spouse Name: N/A    Number of Children: N/A  . Years of Education: N/A   Occupational History  . Not on file.   Social History Main Topics  . Smoking status: Never Smoker   . Smokeless tobacco: Not on file  . Alcohol Use: No  . Drug Use: No  . Sexually Active: Not on file   Other Topics Concern  . Not on file   Social History Narrative   [employer]. 1 year business school. Married 1948-widowed 1981. 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 Just moved to [facility] August '08-living with daughter. Terminal care-does Not want cardiac resuscitation or mechanical ventilation, artifical nutrition or hydration, renal dialysis or major heroic intervention. Laymen's Guide with forms provided.       Review of Systems System review is negative for any constitutional, cardiac, pulmonary, GI or  neuro symptoms or complaints other than as described in the HPI.     Objective:   Physical Exam Filed Vitals:   07/12/11 1610  BP: 152/70  Pulse: 68  Temp: 97.6 F (36.4 C)  Resp: 16   Well nourished elderly woman in no distress. HEENT - periorbital edema with mild erythema, C&S clear, no sinus tenderness to percussion, Throat clear Neck- supple Cor- 2+ radial pulse Pulm - normal respirations, no rales or wheezes, no rhonchi. Neuro- A&O x 3, cognition is excellent, essential tremor noted, normal gait.       Assessment & Plan:  Cough- patterns and symptoms c/w cyclical cough  Plan - prednisone 10 mg qd z 7, phenergan/codiene cough syrup and tessalon perles.  Claritin for post-nasal drainage.

## 2011-08-12 ENCOUNTER — Other Ambulatory Visit: Payer: Self-pay | Admitting: Internal Medicine

## 2011-08-14 ENCOUNTER — Other Ambulatory Visit: Payer: Self-pay | Admitting: General Practice

## 2011-08-14 MED ORDER — FUROSEMIDE 40 MG PO TABS: 40.0000 mg | ORAL_TABLET | Freq: Every day | ORAL | Status: DC

## 2011-11-28 DIAGNOSIS — Z23 Encounter for immunization: Secondary | ICD-10-CM | POA: Diagnosis not present

## 2011-12-01 DIAGNOSIS — H3530 Unspecified macular degeneration: Secondary | ICD-10-CM | POA: Diagnosis not present

## 2011-12-01 DIAGNOSIS — H2640 Unspecified secondary cataract: Secondary | ICD-10-CM | POA: Diagnosis not present

## 2011-12-01 DIAGNOSIS — H04129 Dry eye syndrome of unspecified lacrimal gland: Secondary | ICD-10-CM | POA: Diagnosis not present

## 2011-12-01 DIAGNOSIS — Z961 Presence of intraocular lens: Secondary | ICD-10-CM | POA: Diagnosis not present

## 2012-02-06 ENCOUNTER — Other Ambulatory Visit: Payer: Self-pay | Admitting: Internal Medicine

## 2012-02-08 NOTE — Telephone Encounter (Signed)
Patient notified of refill of medication sent to her pharmacy.

## 2012-04-08 ENCOUNTER — Other Ambulatory Visit: Payer: Self-pay | Admitting: *Deleted

## 2012-04-08 MED ORDER — LABETALOL HCL 200 MG PO TABS: 200.0000 mg | ORAL_TABLET | Freq: Two times a day (BID) | ORAL | Status: DC

## 2012-06-07 ENCOUNTER — Other Ambulatory Visit: Payer: Self-pay

## 2012-06-07 MED ORDER — LABETALOL HCL 200 MG PO TABS: 200.0000 mg | ORAL_TABLET | Freq: Two times a day (BID) | ORAL | Status: DC

## 2012-07-03 ENCOUNTER — Other Ambulatory Visit: Payer: Self-pay

## 2012-07-03 ENCOUNTER — Other Ambulatory Visit: Payer: Self-pay | Admitting: Internal Medicine

## 2012-07-03 MED ORDER — FUROSEMIDE 40 MG PO TABS: 40.0000 mg | ORAL_TABLET | Freq: Every day | ORAL | Status: DC

## 2012-07-05 ENCOUNTER — Other Ambulatory Visit: Payer: Self-pay | Admitting: Internal Medicine

## 2012-07-16 ENCOUNTER — Encounter: Payer: Self-pay | Admitting: Internal Medicine

## 2012-07-16 ENCOUNTER — Ambulatory Visit (INDEPENDENT_AMBULATORY_CARE_PROVIDER_SITE_OTHER): Payer: Medicare Other | Admitting: Internal Medicine

## 2012-07-16 VITALS — BP 154/80 | HR 73 | Temp 98.2°F

## 2012-07-16 DIAGNOSIS — S8000XA Contusion of unspecified knee, initial encounter: Secondary | ICD-10-CM

## 2012-07-16 DIAGNOSIS — S8002XA Contusion of left knee, initial encounter: Secondary | ICD-10-CM

## 2012-07-16 MED ORDER — TRAMADOL HCL 50 MG PO TABS: 50.0000 mg | ORAL_TABLET | Freq: Three times a day (TID) | ORAL | Status: DC | PRN

## 2012-07-16 MED ORDER — LABETALOL HCL 200 MG PO TABS: ORAL_TABLET | ORAL | Status: DC

## 2012-07-16 MED ORDER — FUROSEMIDE 40 MG PO TABS: 40.0000 mg | ORAL_TABLET | Freq: Every day | ORAL | Status: DC

## 2012-07-16 MED ORDER — METHIMAZOLE 5 MG PO TABS: ORAL_TABLET | ORAL | Status: DC

## 2012-07-16 MED ORDER — DILTIAZEM HCL ER 180 MG PO CP24: ORAL_CAPSULE | ORAL | Status: DC

## 2012-07-16 MED ORDER — HYDROCORTISONE ACE-PRAMOXINE 2.5-1 % RE CREA: TOPICAL_CREAM | RECTAL | Status: DC

## 2012-07-16 NOTE — Patient Instructions (Addendum)
blunt trauma to the knees: took the weight on the left knee and you have a contusion - and have bled into the knee space. There really isn't evidence of fracture.  Plan Apply ice to the left knee  If you have increased swelling and pain call early in the AM so we can arrange for a same day evaluation by orthopedics. Call [phone] - tell the person who answers that I asked you to call with a report.   Quadriceps Contusion  A quadriceps contusion is a deep bruise of the large muscle in the front of your thigh. Contusions are the result of an injury that caused bleeding under the skin. The contusion may turn blue, purple, or yellow. Minor injuries will give you a painless contusion, but more severe contusions may stay painful and swollen for a few weeks. It is necessary to follow your caregiver's directions when this muscle is bruised.  CAUSES A quadriceps contusion comes from a blow or injury to the front of the leg. SYMPTOMS   Swelling and redness of the thigh area.  Bruising of the thigh area.  Tenderness or soreness of the thigh.  Limping.  Leg stiffness.  Difficulty bending the leg.  Trouble walking. DIAGNOSIS  You will have a physical exam and will be asked about your history. You may need an X-ray of your leg. TREATMENT  Often, the best treatment for a quadriceps contusion is resting and elevating the leg and applying cold compresses to the thigh area. Over-the-counter medicines may also be recommended for pain control. You may need crutches, an elastic wrap, or a leg splint.  HOME CARE INSTRUCTIONS   Put ice on the injured area.  Put ice in a plastic bag.  Place a towel between your skin and the bag.  Leave the ice on for 15-20 minutes, 3-4 times a day.  Only take over-the-counter or prescription medicines for pain, discomfort, or fever as directed by your caregiver.  Rest the injured thigh until the pain and swelling are better.  Elevate your leg to reduce swelling.  Lie down flat on your back and place a pillow under your knee.  Apply compression wraps as directed by your caregiver. You may remove it for sleeping, showers, and baths. If your toes become numb, cold, or blue, take the wrap off and reapply it more loosely.  Walk or move around as the pain allows, or as directed by your caregiver. Resume full activities only when your caregiver says it is okay. Returning to your usual activities before your caregiver approves may cause worse damage to the muscle.  See your caregiver as directed. It is very important to keep all follow-up referrals and appointments in order to avoid any long-term problems with your leg, including chronic pain or inability to move your leg normally. SEEK MEDICAL CARE IF:   You have increased bruising or swelling.  You have pain that is getting worse.  Your swelling or pain is not relieved by medicines.  Your toes or foot become cold or turn bluish in color.  You notice your thigh getting larger in size. MAKE SURE YOU:   Understand these instructions.  Will watch your condition.  Will get help right away if you are not doing well or get worse. Document Released: 10/18/2000 Document Revised: 04/17/2011 Document Reviewed: 11/08/2010 [facility] Patient Information 2014 [place], [place].

## 2012-07-16 NOTE — Progress Notes (Signed)
  Subjective:    Patient ID: Danielle Adams, female    DOB: 1916-06-27, 79 y.o.   MRN: [id number]  HPI Danielle Adams took a misstep yesterday as she was going out to patio and fell, landing on her knees hard. She had to sit for 30 min before she could get back into the house. She did not strike her head or any other part of her body. She has had some pain and medial swelling right knee. She has had severe pain, swelling around the left knee and into the popliteal fossa and the calf and brusing. She is able to bend the knee and make transfers.  PMH, FamHx and SocHx reviewed for any changes and relevance. Current Outpatient Prescriptions on File Prior to Visit  Medication Sig Dispense Refill  . aspirin 325 MG tablet Take 325 mg by mouth daily.       . Calcium Carbonate-Vitamin D (CALTRATE 600+D) 600-400 MG-UNIT per tablet Take 1 tablet by mouth 2 (two) times daily.        Marland Kitchen docusate sodium (COLACE) 100 MG capsule Take 1 capsule (100 mg total) by mouth as needed. Stool Softener  10 capsule  3  . Multiple Vitamins-Minerals (EYE VITAMINS) CAPS Take 1 each by mouth daily.        . OMEGA 3 1000 MG CAPS Take 1 capsule by mouth 2 (two) times daily.       Marland Kitchen Phenyleph-Promethazine-Cod 5-6.25-10 MG/5ML SYRP Take 5 mLs by mouth 3 (three) times daily.  120 mL  1  . predniSONE (DELTASONE) 10 MG tablet Take 1 tablet (10 mg total) by mouth daily.  10 tablet  0  . [DISCONTINUED] diltiazem (TIAZAC) 180 MG 24 hr capsule Take 1 capsule (180 mg total) by mouth daily.  30 capsule  11   No current facility-administered medications on file prior to visit.      Review of Systems System review is negative for any constitutional, cardiac, pulmonary, GI or neuro symptoms or complaints other than as described in the HPI.     Objective:   Physical Exam Filed Vitals:   07/16/12 1616  BP: 154/80  Pulse: 73  Temp: 98.2 F (36.8 C)   Gen'l - a heavy set elderly white woman in a w/c who is in pain MSK - right knee  with minimal swelling at the medial aspect, good ROM. Left knee diffusely swollen extending distally below the knee, to the popliteal fossa and posterior calf with noticeable early bruising. Patella is not tender and feels normal, is not ballotable. She can extend the leg but with pain.       Assessment & Plan:  Left knee with traumatic contusion - swelling, bruising c/w having bled into the soft tissue around the knee.  Plan  minimize weight bearing  Ice the knee  Tramadol 60 mg q 6 hrs for pain - watch for somnolence  Call with report in AM: for lack of improvement or worsening will refer to same day ortho clinic.

## 2012-07-17 ENCOUNTER — Telehealth: Payer: Self-pay

## 2012-07-17 DIAGNOSIS — S8002XS Contusion of left knee, sequela: Secondary | ICD-10-CM

## 2012-07-17 DIAGNOSIS — S8000XA Contusion of unspecified knee, initial encounter: Secondary | ICD-10-CM | POA: Diagnosis not present

## 2012-07-17 NOTE — Telephone Encounter (Signed)
Request to [facility]

## 2012-07-17 NOTE — Telephone Encounter (Signed)
Pt.notified

## 2012-07-17 NOTE — Telephone Encounter (Signed)
Phone call from [name](daughter) [phone]. Pt is still having left knee pain and swelling. Requesting a referral to ortho for same day appt.

## 2012-08-02 ENCOUNTER — Other Ambulatory Visit: Payer: Self-pay | Admitting: Internal Medicine

## 2012-11-05 DIAGNOSIS — Z23 Encounter for immunization: Secondary | ICD-10-CM | POA: Diagnosis not present

## 2012-11-27 ENCOUNTER — Ambulatory Visit (INDEPENDENT_AMBULATORY_CARE_PROVIDER_SITE_OTHER): Payer: Medicare Other | Admitting: Internal Medicine

## 2012-11-27 ENCOUNTER — Other Ambulatory Visit (INDEPENDENT_AMBULATORY_CARE_PROVIDER_SITE_OTHER): Payer: Medicare Other

## 2012-11-27 ENCOUNTER — Encounter: Payer: Self-pay | Admitting: Internal Medicine

## 2012-11-27 VITALS — BP 116/50 | HR 55 | Temp 97.6°F | Wt 151.8 lb

## 2012-11-27 DIAGNOSIS — I10 Essential (primary) hypertension: Secondary | ICD-10-CM | POA: Diagnosis not present

## 2012-11-27 DIAGNOSIS — G250 Essential tremor: Secondary | ICD-10-CM | POA: Diagnosis not present

## 2012-11-27 DIAGNOSIS — E0590 Thyrotoxicosis, unspecified without thyrotoxic crisis or storm: Secondary | ICD-10-CM

## 2012-11-27 LAB — COMPREHENSIVE METABOLIC PANEL
BUN: 23 mg/dL (ref 6–23)
CO2: 28 mEq/L (ref 19–32)
Calcium: 9.6 mg/dL (ref 8.4–10.5)
Chloride: 106 mEq/L (ref 96–112)
Creatinine, Ser: 1 mg/dL (ref 0.4–1.2)
GFR: 52.36 mL/min — ABNORMAL LOW (ref >60.00)
Glucose, Bld: 90 mg/dL (ref 70–99)

## 2012-11-27 NOTE — Progress Notes (Signed)
Subjective:    Patient ID: Danielle Adams, female    DOB: 1916/11/11, 79 y.o.   MRN: [id number]  HPI Danielle Adams presents for routine medical follow up. She is feeling fine - nothing to complain about. No chest pain, breathing trouble and all systems are working OK.  Past Medical History  Diagnosis Date  . Rapid heart rate     arrythmia  . HTN (hypertension)   . Familial tremor   . Allergic rhinitis, cause unspecified 01/25/2011   Past Surgical History  Procedure Laterality Date  . Tonsillectomy    . Dilation and curettage of uterus    . Bunionectomy    . Laparoscopic ovarian cystectomy  04/2009   Family History  Problem Relation Age of Onset  . Pancreatic cancer Mother   . Coronary artery disease Father   . Diabetes Sister   . Diabetes Brother    History   Social History  . Marital Status: Widowed    Spouse Name: N/A    Number of Children: N/A  . Years of Education: N/A   Occupational History  . Not on file.   Social History Main Topics  . Smoking status: Never Smoker   . Smokeless tobacco: Not on file  . Alcohol Use: No  . Drug Use: No  . Sexual Activity: Not on file   Other Topics Concern  . Not on file   Social History Narrative   [employer]. 1 year business school. Married 1948-widowed 1981. 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 Just moved to [facility] August '08-living with daughter. Terminal care-does Not want cardiac resuscitation or mechanical ventilation, artifical nutrition or hydration, renal dialysis or major heroic intervention. Laymen's Guide with forms provided.    Current Outpatient Prescriptions on File Prior to Visit  Medication Sig Dispense Refill  . aspirin 325 MG tablet Take 325 mg by mouth daily.       . Calcium Carbonate-Vitamin D (CALTRATE 600+D) 600-400 MG-UNIT per tablet Take 1 tablet by mouth 2 (two) times daily.        Marland Kitchen diltiazem (DILACOR XR) 180 MG 24 hr capsule TAKE ONE CAPLET ONE TIME  DAILY  90 capsule  3  . docusate sodium (COLACE) 100 MG capsule Take 1 capsule (100 mg total) by mouth as needed. Stool Softener  10 capsule  3  . furosemide (LASIX) 40 MG tablet Take 1 tablet (40 mg total) by mouth daily.  90 tablet  3  . hydrocortisone-pramoxine (ANALPRAM-HC) 2.5-1 % rectal cream PLACE RECTALLY TWICE DAILY  30 g  1  . labetalol (NORMODYNE) 200 MG tablet TAKE 1 TABLET BY MOUTH TWICE DAILY  120 tablet  3  . methimazole (TAPAZOLE) 5 MG tablet TAKE 1 BY MOUTH EVERY DAY  90 tablet  3  . Multiple Vitamins-Minerals (EYE VITAMINS) CAPS Take 1 each by mouth daily.        . OMEGA 3 1000 MG CAPS Take 1 capsule by mouth 2 (two) times daily.       . [DISCONTINUED] diltiazem (TIAZAC) 180 MG 24 hr capsule Take 1 capsule (180 mg total) by mouth daily.  30 capsule  11   No current facility-administered medications on file prior to visit.       Review of Systems Constitutional:  Negative for fever, chills, activity change and unexpected weight change.  HEENT:  Negative for hearing loss, ear pain, congestion, neck stiffness and postnasal drip. Negative for sore throat or swallowing problems. Negative for dental complaints.  Eyes: Negative for vision loss or change in visual acuity.  Respiratory: Negative for chest tightness and wheezing. Negative for DOE.   Cardiovascular: Negative for chest pain or palpitations. No decreased exercise tolerance Gastrointestinal: No change in bowel habit. No bloating or gas. No reflux or indigestion Genitourinary: Negative for urgency, frequency, flank pain and difficulty urinating.  Musculoskeletal: Negative for myalgias, back pain, arthralgias and gait problem.  Neurological: Negative for dizziness, tremors, weakness and headaches.  Hematological: Negative for adenopathy.  Psychiatric/Behavioral: Negative for behavioral problems and dysphoric mood.       Objective:   Physical Exam Filed Vitals:   11/27/12 1018  BP: 116/50  Pulse: 55  Temp: 97.6  F (36.4 C)   Wt Readings from Last 3 Encounters:  11/27/12 151 lb 12.8 oz (68.856 kg)  07/12/11 152 lb (68.947 kg)  05/16/11 153 lb (69.4 kg)   Gen'l - WNWD older woman with an essential tremor HEENT_ [place]/AT C&S clear Cor - 2+ radial, RRR Pulm - normal breath sounds Neuro - essential tremor otherwise non-focal  Recent Results (from the past 2160 hour(s))  COMPREHENSIVE METABOLIC PANEL     Status: Abnormal   Collection Time    11/27/12 10:54 AM      Result Value Range   Sodium 143  135 - 145 mEq/L   Potassium 4.1  3.5 - 5.1 mEq/L   Chloride 106  96 - 112 mEq/L   CO2 28  19 - 32 mEq/L   Glucose, Bld 90  70 - 99 mg/dL   BUN 23  6 - 23 mg/dL   Creatinine, Ser 1.0  0.4 - 1.2 mg/dL   Total Bilirubin 0.9  0.3 - 1.2 mg/dL   Alkaline Phosphatase 70  39 - 117 U/L   AST 17  0 - 37 U/L   ALT 13  0 - 35 U/L   Total Protein 6.5  6.0 - 8.3 g/dL   Albumin 3.7  3.5 - 5.2 g/dL   Calcium 9.6  8.4 - 16.1 mg/dL   GFR 09.60 (*) >45.40 mL/min  TSH     Status: None   Collection Time    11/27/12 10:54 AM      Result Value Range   TSH 3.22  0.35 - 5.50 uIU/mL        Assessment & Plan:

## 2012-11-27 NOTE — Patient Instructions (Signed)
Thank you for coming to see me.   You seem to be doing well. A limited exam is normal: clear lungs, heart rate is controlled, no fluid build up.  Plan is for routine labs today to check on you kidney function, blood sugar and potassium  You have valid prescriptions in our record from June '14 to June '15. Let me know if there is a problem.  We will update your immunization record.  Have a nice fall.

## 2012-11-28 ENCOUNTER — Encounter: Payer: Self-pay | Admitting: Internal Medicine

## 2012-11-28 NOTE — Assessment & Plan Note (Signed)
BP Readings from Last 3 Encounters:  11/27/12 116/50  07/16/12 154/80  07/12/11 152/70   Excellent control.

## 2012-11-28 NOTE — Assessment & Plan Note (Signed)
Stable. Labetalol is probably of some help.

## 2012-11-28 NOTE — Assessment & Plan Note (Signed)
Lab Results  Component Value Date   TSH 3.22 11/27/2012   Excellent control on present medication. No signs of hyperthyroidism on exam.  Plan Continue present medications

## 2012-12-06 DIAGNOSIS — H3530 Unspecified macular degeneration: Secondary | ICD-10-CM | POA: Diagnosis not present

## 2012-12-06 DIAGNOSIS — Z961 Presence of intraocular lens: Secondary | ICD-10-CM | POA: Diagnosis not present

## 2012-12-06 DIAGNOSIS — H2640 Unspecified secondary cataract: Secondary | ICD-10-CM | POA: Diagnosis not present

## 2013-02-25 ENCOUNTER — Emergency Department (HOSPITAL_BASED_OUTPATIENT_CLINIC_OR_DEPARTMENT_OTHER): Payer: Medicare Other

## 2013-02-25 ENCOUNTER — Encounter (HOSPITAL_BASED_OUTPATIENT_CLINIC_OR_DEPARTMENT_OTHER): Payer: Self-pay | Admitting: Emergency Medicine

## 2013-02-25 ENCOUNTER — Inpatient Hospital Stay (HOSPITAL_BASED_OUTPATIENT_CLINIC_OR_DEPARTMENT_OTHER)
Admission: EM | Admit: 2013-02-25 | Discharge: 2013-03-07 | DRG: 291 | Disposition: A | Discharge status: Skilled Nursing Facility | Payer: Medicare Other | Attending: Internal Medicine | Admitting: Internal Medicine

## 2013-02-25 DIAGNOSIS — G250 Essential tremor: Secondary | ICD-10-CM | POA: Diagnosis present

## 2013-02-25 DIAGNOSIS — Z66 Do not resuscitate: Secondary | ICD-10-CM | POA: Diagnosis present

## 2013-02-25 DIAGNOSIS — I824Y9 Acute embolism and thrombosis of unspecified deep veins of unspecified proximal lower extremity: Secondary | ICD-10-CM | POA: Diagnosis present

## 2013-02-25 DIAGNOSIS — Z5189 Encounter for other specified aftercare: Secondary | ICD-10-CM | POA: Diagnosis not present

## 2013-02-25 DIAGNOSIS — J189 Pneumonia, unspecified organism: Secondary | ICD-10-CM | POA: Diagnosis not present

## 2013-02-25 DIAGNOSIS — I10 Essential (primary) hypertension: Secondary | ICD-10-CM | POA: Diagnosis present

## 2013-02-25 DIAGNOSIS — J309 Allergic rhinitis, unspecified: Secondary | ICD-10-CM

## 2013-02-25 DIAGNOSIS — E876 Hypokalemia: Secondary | ICD-10-CM | POA: Diagnosis present

## 2013-02-25 DIAGNOSIS — R1903 Right lower quadrant abdominal swelling, mass and lump: Secondary | ICD-10-CM

## 2013-02-25 DIAGNOSIS — R630 Anorexia: Secondary | ICD-10-CM | POA: Diagnosis present

## 2013-02-25 DIAGNOSIS — J9600 Acute respiratory failure, unspecified whether with hypoxia or hypercapnia: Secondary | ICD-10-CM | POA: Diagnosis present

## 2013-02-25 DIAGNOSIS — I4891 Unspecified atrial fibrillation: Secondary | ICD-10-CM | POA: Diagnosis not present

## 2013-02-25 DIAGNOSIS — J13 Pneumonia due to Streptococcus pneumoniae: Secondary | ICD-10-CM | POA: Diagnosis present

## 2013-02-25 DIAGNOSIS — N390 Urinary tract infection, site not specified: Secondary | ICD-10-CM | POA: Diagnosis present

## 2013-02-25 DIAGNOSIS — R5381 Other malaise: Secondary | ICD-10-CM | POA: Diagnosis not present

## 2013-02-25 DIAGNOSIS — H9190 Unspecified hearing loss, unspecified ear: Secondary | ICD-10-CM | POA: Diagnosis present

## 2013-02-25 DIAGNOSIS — Z800 Family history of malignant neoplasm of digestive organs: Secondary | ICD-10-CM

## 2013-02-25 DIAGNOSIS — I509 Heart failure, unspecified: Secondary | ICD-10-CM | POA: Diagnosis not present

## 2013-02-25 DIAGNOSIS — Z833 Family history of diabetes mellitus: Secondary | ICD-10-CM

## 2013-02-25 DIAGNOSIS — R609 Edema, unspecified: Secondary | ICD-10-CM | POA: Diagnosis not present

## 2013-02-25 DIAGNOSIS — M6281 Muscle weakness (generalized): Secondary | ICD-10-CM | POA: Diagnosis not present

## 2013-02-25 DIAGNOSIS — R7309 Other abnormal glucose: Secondary | ICD-10-CM | POA: Diagnosis present

## 2013-02-25 DIAGNOSIS — R0902 Hypoxemia: Secondary | ICD-10-CM | POA: Diagnosis not present

## 2013-02-25 DIAGNOSIS — J90 Pleural effusion, not elsewhere classified: Secondary | ICD-10-CM | POA: Diagnosis not present

## 2013-02-25 DIAGNOSIS — I369 Nonrheumatic tricuspid valve disorder, unspecified: Secondary | ICD-10-CM | POA: Diagnosis not present

## 2013-02-25 DIAGNOSIS — G252 Other specified forms of tremor: Secondary | ICD-10-CM

## 2013-02-25 DIAGNOSIS — J984 Other disorders of lung: Secondary | ICD-10-CM | POA: Diagnosis not present

## 2013-02-25 DIAGNOSIS — I5033 Acute on chronic diastolic (congestive) heart failure: Principal | ICD-10-CM | POA: Diagnosis present

## 2013-02-25 DIAGNOSIS — I5020 Unspecified systolic (congestive) heart failure: Secondary | ICD-10-CM | POA: Diagnosis not present

## 2013-02-25 DIAGNOSIS — I2699 Other pulmonary embolism without acute cor pulmonale: Secondary | ICD-10-CM | POA: Diagnosis not present

## 2013-02-25 DIAGNOSIS — Z8249 Family history of ischemic heart disease and other diseases of the circulatory system: Secondary | ICD-10-CM

## 2013-02-25 DIAGNOSIS — J154 Pneumonia due to other streptococci: Secondary | ICD-10-CM | POA: Diagnosis not present

## 2013-02-25 DIAGNOSIS — E041 Nontoxic single thyroid nodule: Secondary | ICD-10-CM

## 2013-02-25 DIAGNOSIS — Z515 Encounter for palliative care: Secondary | ICD-10-CM | POA: Diagnosis not present

## 2013-02-25 DIAGNOSIS — Z7982 Long term (current) use of aspirin: Secondary | ICD-10-CM

## 2013-02-25 DIAGNOSIS — R091 Pleurisy: Secondary | ICD-10-CM | POA: Diagnosis not present

## 2013-02-25 DIAGNOSIS — A498 Other bacterial infections of unspecified site: Secondary | ICD-10-CM | POA: Diagnosis present

## 2013-02-25 DIAGNOSIS — R279 Unspecified lack of coordination: Secondary | ICD-10-CM | POA: Diagnosis not present

## 2013-02-25 DIAGNOSIS — E0590 Thyrotoxicosis, unspecified without thyrotoxic crisis or storm: Secondary | ICD-10-CM | POA: Diagnosis present

## 2013-02-25 DIAGNOSIS — I359 Nonrheumatic aortic valve disorder, unspecified: Secondary | ICD-10-CM

## 2013-02-25 DIAGNOSIS — I82409 Acute embolism and thrombosis of unspecified deep veins of unspecified lower extremity: Secondary | ICD-10-CM | POA: Diagnosis not present

## 2013-02-25 DIAGNOSIS — I872 Venous insufficiency (chronic) (peripheral): Secondary | ICD-10-CM

## 2013-02-25 DIAGNOSIS — Z79899 Other long term (current) drug therapy: Secondary | ICD-10-CM | POA: Diagnosis not present

## 2013-02-25 DIAGNOSIS — R0602 Shortness of breath: Secondary | ICD-10-CM | POA: Diagnosis not present

## 2013-02-25 DIAGNOSIS — D72829 Elevated white blood cell count, unspecified: Secondary | ICD-10-CM

## 2013-02-25 HISTORY — DX: Disorder of thyroid, unspecified: E07.9

## 2013-02-25 LAB — CBC WITH DIFFERENTIAL/PLATELET
BASOS PCT: 0 % (ref 0–1)
Band Neutrophils: 7 % (ref 0–10)
Basophils Absolute: 0 10*3/uL (ref 0.0–0.1)
Eosinophils Absolute: 0 10*3/uL (ref 0.0–0.7)
Eosinophils Relative: 0 % (ref 0–5)
HEMATOCRIT: 33.4 % — AB (ref 36.0–46.0)
HEMOGLOBIN: 11.1 g/dL — AB (ref 12.0–15.0)
LYMPHS ABS: 1.8 10*3/uL (ref 0.7–4.0)
LYMPHS PCT: 11 % — AB (ref 12–46)
MCH: 28.1 pg (ref 26.0–34.0)
MCHC: 33.2 g/dL (ref 30.0–36.0)
MCV: 84.6 fL (ref 78.0–100.0)
MONOS PCT: 6 % (ref 3–12)
Monocytes Absolute: 1 10*3/uL (ref 0.1–1.0)
Myelocytes: 1 %
Neutro Abs: 13.2 10*3/uL — ABNORMAL HIGH (ref 1.7–7.7)
Neutrophils Relative %: 75 % (ref 43–77)
Platelets: 209 10*3/uL (ref 150–400)
RBC: 3.95 MIL/uL (ref 3.87–5.11)
RDW: 14.5 % (ref 11.5–15.5)
WBC: 16 10*3/uL — ABNORMAL HIGH (ref 4.0–10.5)

## 2013-02-25 LAB — COMPREHENSIVE METABOLIC PANEL
ALT: 24 U/L (ref 0–35)
AST: 18 U/L (ref 0–37)
Albumin: 2.7 g/dL — ABNORMAL LOW (ref 3.5–5.2)
Alkaline Phosphatase: 112 U/L (ref 39–117)
BILIRUBIN TOTAL: 0.9 mg/dL (ref 0.3–1.2)
BUN: 26 mg/dL — ABNORMAL HIGH (ref 6–23)
CHLORIDE: 96 meq/L (ref 96–112)
CO2: 24 mEq/L (ref 19–32)
Calcium: 9.5 mg/dL (ref 8.4–10.5)
Creatinine, Ser: 1.1 mg/dL (ref 0.50–1.10)
GFR calc Af Amer: 48 mL/min — ABNORMAL LOW (ref >90)
GFR calc non Af Amer: 41 mL/min — ABNORMAL LOW (ref >90)
Glucose, Bld: 160 mg/dL — ABNORMAL HIGH (ref 70–99)
Potassium: 3.3 mEq/L — ABNORMAL LOW (ref 3.7–5.3)
SODIUM: 138 meq/L (ref 137–147)
TOTAL PROTEIN: 6.4 g/dL (ref 6.0–8.3)

## 2013-02-25 LAB — URINALYSIS, ROUTINE W REFLEX MICROSCOPIC
Glucose, UA: NEGATIVE mg/dL
Hgb urine dipstick: NEGATIVE
Ketones, ur: 15 mg/dL — AB
Nitrite: NEGATIVE
PH: 5.5 (ref 5.0–8.0)
Protein, ur: 30 mg/dL — AB
Specific Gravity, Urine: 1.022 (ref 1.005–1.030)
Urobilinogen, UA: 1 mg/dL (ref 0.0–1.0)

## 2013-02-25 LAB — URINE MICROSCOPIC-ADD ON

## 2013-02-25 LAB — TROPONIN I: Troponin I: <0.3 ng/mL (ref <0.30)

## 2013-02-25 LAB — PRO B NATRIURETIC PEPTIDE: Pro B Natriuretic peptide (BNP): 10383 pg/mL — ABNORMAL HIGH (ref 0–450)

## 2013-02-25 MED ORDER — DEXTROSE 5 % IV SOLN: 1.0000 g | Freq: Once | INTRAVENOUS | Status: AC

## 2013-02-25 MED ORDER — AZITHROMYCIN 500 MG IV SOLR
500.0000 mg | Freq: Once | INTRAVENOUS | Status: AC
  Administered 2013-02-25: 500 mg via INTRAVENOUS

## 2013-02-25 MED ORDER — FUROSEMIDE 10 MG/ML IJ SOLN
40.0000 mg | Freq: Once | INTRAMUSCULAR | Status: AC
  Administered 2013-02-25: 40 mg via INTRAVENOUS
  Filled 2013-02-25: qty 4

## 2013-02-25 MED ORDER — CEFTRIAXONE SODIUM 1 G IJ SOLR
INTRAMUSCULAR | Status: AC
  Administered 2013-02-25: 1000 mg
  Filled 2013-02-25: qty 10

## 2013-02-25 NOTE — ED Notes (Signed)
RN placed oxygen on Pt. At 21/2 liters via N/C .  Pt. O2 sats are 89% on RA with 90% on 2/1 liters oxygen

## 2013-02-25 NOTE — ED Notes (Signed)
Patient denies pain and is resting comfortably.  

## 2013-02-25 NOTE — ED Notes (Signed)
Patient transported to X-ray 

## 2013-02-25 NOTE — Progress Notes (Signed)
80 [facility] requested for acute decompensated CHF. Patient also found to have atrial fibrillation. Request for telemetry bed.  Danielle Passeylma Jamespaul [facility] [phone]

## 2013-02-25 NOTE — ED Notes (Signed)
Family at bedside. 

## 2013-02-25 NOTE — ED Notes (Signed)
Pt. Has had cough for approx. 2 wks and now having weakness severe per family.  Pt. Has noted cough and tremors upon assessment.

## 2013-02-25 NOTE — ED Notes (Signed)
MD at bedside. 

## 2013-02-25 NOTE — ED Provider Notes (Signed)
CSN: [id number]     Arrival date & time 02/25/13  2007 History  This chart was scribed for Danielle Lyons, MD by Dorothey Baseman, ED Scribe. This patient was seen in room MH05/MH05 and the patient's care was started at 8:35 PM.    Chief Complaint  Patient presents with  . Fatigue   The history is provided by the patient and a relative (daughter). No language interpreter was used.   HPI Comments: Danielle Adams is a 80 y.o. female who presents to the Emergency Department complaining of severe weakness, fatigue, and shortness of breath that is exacerbated with exertion onset earlier today. She reports some associated swelling to the bilateral lower extremities. She reports some associated decreased appetite. Her daughter reports that the patient has had a productive cough with associated congestion for the past 2 weeks that has been gradually improving. She denies chest pain, fever, emesis, diarrhea. Patient has a history of HTN and allergic rhinitis. Patient takes diltiazem (180 mg) and Lasix (40 mg) daily.   Past Medical History  Diagnosis Date  . Rapid heart rate     arrythmia  . HTN (hypertension)   . Familial tremor   . Allergic rhinitis, cause unspecified 01/25/2011   Past Surgical History  Procedure Laterality Date  . Tonsillectomy    . Dilation and curettage of uterus    . Bunionectomy    . Laparoscopic ovarian cystectomy  04/2009   Family History  Problem Relation Age of Onset  . Pancreatic cancer Mother   . Coronary artery disease Father   . Diabetes Sister   . Diabetes Brother    History  Substance Use Topics  . Smoking status: Never Smoker   . Smokeless tobacco: Not on file  . Alcohol Use: No   OB History   Grav Para Term Preterm Abortions TAB SAB Ect Mult Living   3 3             Obstetric Comments   NSVD     Review of Systems  A complete 10 system review of systems was obtained and all systems are negative except as noted in the HPI and PMH.   Allergies   Review of patient's allergies indicates no known allergies.  Home Medications   Current Outpatient Rx  Name  Route  Sig  Dispense  Refill  . aspirin 325 MG tablet   Oral   Take 325 mg by mouth daily.          . Calcium Carbonate-Vitamin D (CALTRATE 600+D) 600-400 MG-UNIT per tablet   Oral   Take 1 tablet by mouth 2 (two) times daily.           Marland Kitchen diltiazem (DILACOR XR) 180 MG 24 hr capsule      TAKE ONE CAPLET ONE TIME DAILY   90 capsule   3     LAST SEEN June 2013, WILL NEED AN OFFICE VISIT BEF ...   . docusate sodium (COLACE) 100 MG capsule   Oral   Take 1 capsule (100 mg total) by mouth as needed. Stool Softener   10 capsule   3   . furosemide (LASIX) 40 MG tablet   Oral   Take 1 tablet (40 mg total) by mouth daily.   90 tablet   3     PATIENT IS DUE FOR AN OFFICE VISIT IN JUNE   . hydrocortisone-pramoxine (ANALPRAM-HC) 2.5-1 % rectal cream      PLACE RECTALLY TWICE DAILY   30 g  1   . labetalol (NORMODYNE) 200 MG tablet      TAKE 1 TABLET BY MOUTH TWICE DAILY   120 tablet   3     DUE FOR AN OFFICE VISIT   . methimazole (TAPAZOLE) 5 MG tablet      TAKE 1 BY MOUTH EVERY DAY   90 tablet   3     LAST SEEN June 2013, WILL NEED AN OFFICE VISIT BEF ...   . Multiple Vitamins-Minerals (EYE VITAMINS) CAPS   Oral   Take 1 each by mouth daily.           . OMEGA 3 1000 MG CAPS   Oral   Take 1 capsule by mouth 2 (two) times daily.           Triage Vitals: BP 127/57  Pulse 70  Temp(Src) 98.2 F (36.8 C) (Oral)  Resp 24  Ht 5\' 6"  (1.676 m)  SpO2 92%  Physical Exam  Nursing note and vitals reviewed. Constitutional: She is oriented to person, place, and time. She appears well-developed and well-nourished. No distress.  Patient is a 80 year old female who appears somewhat frail. She is in no acute distress.   HENT:  Head: Normocephalic and atraumatic.  Mouth/Throat: Oropharynx is clear and moist.  Eyes: Conjunctivae are normal.  Neck:  Normal range of motion. Neck supple.  Cardiovascular: Normal rate and normal heart sounds.   No murmur heard. Irregularly irregular.   Pulmonary/Chest: Effort normal. No respiratory distress. She has rales.  Slight rales in the bases bilaterally.   Abdominal: Soft. Bowel sounds are normal. She exhibits no distension and no mass. There is no tenderness. There is no rebound and no guarding.  Musculoskeletal: Normal range of motion. She exhibits edema.  There is 1-2+ pitting edema to the bilateral lower extremities.   Neurological: She is alert and oriented to person, place, and time.  Skin: Skin is warm and dry.  Psychiatric: She has a normal mood and affect. Her behavior is normal.    ED Course  Procedures (including critical care time)  DIAGNOSTIC STUDIES: Oxygen Saturation is 92% on room air, adequate by my interpretation.    COORDINATION OF CARE: 8:39 PM- Discussed that preliminary EKG results appear somewhat abnormal. Will order blood labs and a chest x-ray to further delineate. Discussed treatment plan with patient at bedside and patient verbalized agreement.     Labs Review Labs Reviewed  CBC WITH DIFFERENTIAL - Abnormal; Notable for the following:    WBC 16.0 (*)    Hemoglobin 11.1 (*)    HCT 33.4 (*)    Lymphocytes Relative 11 (*)    Neutro Abs 13.2 (*)    All other components within normal limits  COMPREHENSIVE METABOLIC PANEL - Abnormal; Notable for the following:    Potassium 3.3 (*)    Glucose, Bld 160 (*)    BUN 26 (*)    Albumin 2.7 (*)    GFR calc non Af Amer 41 (*)    GFR calc Af Amer 48 (*)    All other components within normal limits  PRO B NATRIURETIC PEPTIDE - Abnormal; Notable for the following:    Pro B Natriuretic peptide (BNP) 10383.0 (*)    All other components within normal limits  TROPONIN I   Imaging Review Dg Chest 2 View  02/25/2013   CLINICAL DATA:  Weakness and shortness of breath. History of hypertension.  EXAM: CHEST  2 VIEW   COMPARISON:  Chest radiograph  May 10, 2011  FINDINGS: New right middle lobe consolidation, with minimal patchy bibasilar airspace opacities. Blunting of the right costophrenic angle. No pneumothorax.  Stable cardiomegaly. Calcified aortic knob, mediastinal silhouette is otherwise unremarkable. Increasing interstitial prominence. Remote right lateral rib fractures. Osteopenia. Multiple EKG lines overlie the patient and may obscure subtle underlying pathology.  IMPRESSION: Right middle lobe consolidation concerning for pneumonia, with bibasilar patchy airspace opacities suggesting atelectasis with right lung base pleural thickening. Recommend followup chest radiograph after treatment to verify improvement.  Cardiomegaly, interstitial prominence suggesting COPD or possible edema.   Electronically Signed   By: Awilda Metroourtnay  Bloomer   On: 02/25/2013 22:09    EKG Interpretation    Date/Time:  Tuesday February 25 2013 20:27:48 EST Ventricular Rate:  62 PR Interval:    QRS Duration: 162 QT Interval:  478 QTC Calculation: 485 R Axis:   66 Text Interpretation:  Atrial fibrillation Right bundle branch block T wave abnormality, consider inferolateral ischemia Abnormal ECG Confirmed by DELOS  MD, Tangela Dolliver ([id number]) on 02/25/2013 10:38:26 PM            MDM  No diagnosis found. Patient is a 80 year old female with history of hypertension. She was brought here by family for evaluation of a two-week history of progressive dyspnea. She was initially hypoxic with O2 sats of 85% on room air. This corrected with supplemental oxygen. Workup reveals an elevated white count of 16,000, an elevated BNP, and chest x-ray which is suggestive of right middle lobe pneumonia and possibly pulmonary edema. She was given IV Rocephin and Zithromax along with IV Lasix. A Foley catheter will be placed. I've spoken with Dr. Elisabeth Pigeonevine from [facility] who agrees to 2 except the patient in transfer.  I personally performed the services described in  this documentation, which was scribed in my presence. The recorded information has been reviewed and is accurate.       Danielle Lyonsouglas Dinisha Cai, MD 02/25/13 2240

## 2013-02-26 ENCOUNTER — Encounter (HOSPITAL_COMMUNITY): Payer: Self-pay | Admitting: Internal Medicine

## 2013-02-26 DIAGNOSIS — J9600 Acute respiratory failure, unspecified whether with hypoxia or hypercapnia: Secondary | ICD-10-CM | POA: Diagnosis not present

## 2013-02-26 DIAGNOSIS — J189 Pneumonia, unspecified organism: Secondary | ICD-10-CM

## 2013-02-26 DIAGNOSIS — I10 Essential (primary) hypertension: Secondary | ICD-10-CM

## 2013-02-26 DIAGNOSIS — I369 Nonrheumatic tricuspid valve disorder, unspecified: Secondary | ICD-10-CM

## 2013-02-26 DIAGNOSIS — I359 Nonrheumatic aortic valve disorder, unspecified: Secondary | ICD-10-CM

## 2013-02-26 DIAGNOSIS — I5033 Acute on chronic diastolic (congestive) heart failure: Secondary | ICD-10-CM | POA: Diagnosis not present

## 2013-02-26 DIAGNOSIS — I4891 Unspecified atrial fibrillation: Secondary | ICD-10-CM

## 2013-02-26 DIAGNOSIS — I509 Heart failure, unspecified: Secondary | ICD-10-CM | POA: Diagnosis not present

## 2013-02-26 LAB — COMPREHENSIVE METABOLIC PANEL
ALT: 20 U/L (ref 0–35)
AST: 16 U/L (ref 0–37)
Albumin: 2.4 g/dL — ABNORMAL LOW (ref 3.5–5.2)
Alkaline Phosphatase: 106 U/L (ref 39–117)
BUN: 21 mg/dL (ref 6–23)
CALCIUM: 8.6 mg/dL (ref 8.4–10.5)
CO2: 25 mEq/L (ref 19–32)
Chloride: 99 mEq/L (ref 96–112)
Creatinine, Ser: 0.97 mg/dL (ref 0.50–1.10)
GFR calc Af Amer: 56 mL/min — ABNORMAL LOW (ref >90)
GFR, EST NON AFRICAN AMERICAN: 48 mL/min — AB (ref >90)
Glucose, Bld: 168 mg/dL — ABNORMAL HIGH (ref 70–99)
Potassium: 3.7 mEq/L (ref 3.7–5.3)
Sodium: 139 mEq/L (ref 137–147)
Total Bilirubin: 0.9 mg/dL (ref 0.3–1.2)
Total Protein: 5.7 g/dL — ABNORMAL LOW (ref 6.0–8.3)

## 2013-02-26 LAB — STREP PNEUMONIAE URINARY ANTIGEN: Strep Pneumo Urinary Antigen: POSITIVE — AB

## 2013-02-26 LAB — TSH: TSH: 0.95 u[IU]/mL (ref 0.350–4.500)

## 2013-02-26 LAB — CBC WITH DIFFERENTIAL/PLATELET
Basophils Absolute: 0 10*3/uL (ref 0.0–0.1)
Basophils Relative: 0 % (ref 0–1)
EOS PCT: 0 % (ref 0–5)
Eosinophils Absolute: 0 10*3/uL (ref 0.0–0.7)
HEMATOCRIT: 32 % — AB (ref 36.0–46.0)
Hemoglobin: 10.7 g/dL — ABNORMAL LOW (ref 12.0–15.0)
LYMPHS ABS: 1 10*3/uL (ref 0.7–4.0)
LYMPHS PCT: 6 % — AB (ref 12–46)
MCH: 28.3 pg (ref 26.0–34.0)
MCHC: 33.4 g/dL (ref 30.0–36.0)
MCV: 84.7 fL (ref 78.0–100.0)
MONO ABS: 1.2 10*3/uL — AB (ref 0.1–1.0)
Monocytes Relative: 7 % (ref 3–12)
Neutro Abs: 14.3 10*3/uL — ABNORMAL HIGH (ref 1.7–7.7)
Neutrophils Relative %: 86 % — ABNORMAL HIGH (ref 43–77)
Platelets: 197 10*3/uL (ref 150–400)
RBC: 3.78 MIL/uL — AB (ref 3.87–5.11)
RDW: 14.7 % (ref 11.5–15.5)
WBC: 16.5 10*3/uL — AB (ref 4.0–10.5)

## 2013-02-26 LAB — LEGIONELLA ANTIGEN, URINE: LEGIONELLA ANTIGEN, URINE: NEGATIVE

## 2013-02-26 LAB — INFLUENZA PANEL BY PCR (TYPE A & B)
H1N1FLUPCR: NOT DETECTED
INFLBPCR: NEGATIVE
Influenza A By PCR: NEGATIVE

## 2013-02-26 LAB — HEMOGLOBIN A1C
Hgb A1c MFr Bld: 6.7 % — ABNORMAL HIGH (ref <5.7)
Mean Plasma Glucose: 146 mg/dL — ABNORMAL HIGH (ref <117)

## 2013-02-26 LAB — TROPONIN I: Troponin I: <0.3 ng/mL (ref <0.30)

## 2013-02-26 LAB — MAGNESIUM: Magnesium: 1.8 mg/dL (ref 1.5–2.5)

## 2013-02-26 MED ORDER — CALCIUM CARBONATE-VITAMIN D 500-200 MG-UNIT PO TABS
1.0000 | ORAL_TABLET | Freq: Two times a day (BID) | ORAL | Status: DC
  Administered 2013-02-26 – 2013-02-28 (×5): 1 via ORAL
  Filled 2013-02-26 (×10): qty 1

## 2013-02-26 MED ORDER — FUROSEMIDE 10 MG/ML IJ SOLN
40.0000 mg | Freq: Once | INTRAMUSCULAR | Status: AC
  Administered 2013-02-26: 40 mg via INTRAVENOUS

## 2013-02-26 MED ORDER — DILTIAZEM HCL ER 180 MG PO CP24
180.0000 mg | ORAL_CAPSULE | Freq: Every day | ORAL | Status: DC
  Administered 2013-02-26: 180 mg via ORAL
  Filled 2013-02-26 (×2): qty 1

## 2013-02-26 MED ORDER — ACETAMINOPHEN 650 MG RE SUPP: 650.0000 mg | Freq: Four times a day (QID) | RECTAL | Status: DC | PRN

## 2013-02-26 MED ORDER — ONDANSETRON HCL 4 MG/2ML IJ SOLN: 4.0000 mg | Freq: Four times a day (QID) | INTRAMUSCULAR | Status: DC | PRN

## 2013-02-26 MED ORDER — HYDROCORTISONE ACE-PRAMOXINE 2.5-1 % RE CREA
TOPICAL_CREAM | Freq: Two times a day (BID) | RECTAL | Status: DC
Start: 2013-02-26 — End: 2013-02-26
  Filled 2013-02-26: qty 30

## 2013-02-26 MED ORDER — ACETAMINOPHEN 325 MG PO TABS
650.0000 mg | ORAL_TABLET | Freq: Four times a day (QID) | ORAL | Status: DC | PRN
  Administered 2013-02-26: 650 mg via ORAL
  Filled 2013-02-26 (×2): qty 2

## 2013-02-26 MED ORDER — HYDROCORTISONE ACE-PRAMOXINE 1-1 % RE FOAM
Freq: Two times a day (BID) | RECTAL | Status: DC
  Administered 2013-02-26 – 2013-02-28 (×2): via RECTAL
  Filled 2013-02-26: qty 10

## 2013-02-26 MED ORDER — SODIUM CHLORIDE 0.9 % IJ SOLN
3.0000 mL | Freq: Two times a day (BID) | INTRAMUSCULAR | Status: DC
  Administered 2013-02-28: 3 mL via INTRAVENOUS

## 2013-02-26 MED ORDER — ONDANSETRON HCL 4 MG PO TABS: 4.0000 mg | ORAL_TABLET | Freq: Four times a day (QID) | ORAL | Status: DC | PRN

## 2013-02-26 MED ORDER — LABETALOL HCL 200 MG PO TABS
200.0000 mg | ORAL_TABLET | Freq: Two times a day (BID) | ORAL | Status: DC
  Administered 2013-02-26 – 2013-03-01 (×8): 200 mg via ORAL
  Filled 2013-02-26 (×9): qty 1

## 2013-02-26 MED ORDER — ENSURE COMPLETE PO LIQD
237.0000 mL | Freq: Two times a day (BID) | ORAL | Status: DC
  Administered 2013-02-27 – 2013-03-01 (×4): 237 mL via ORAL

## 2013-02-26 MED ORDER — METHIMAZOLE 5 MG PO TABS
5.0000 mg | ORAL_TABLET | Freq: Every day | ORAL | Status: DC
  Administered 2013-02-26 – 2013-03-01 (×4): 5 mg via ORAL
  Filled 2013-02-26 (×4): qty 1

## 2013-02-26 MED ORDER — ENOXAPARIN SODIUM 30 MG/0.3ML ~~LOC~~ SOLN
30.0000 mg | SUBCUTANEOUS | Status: DC
  Filled 2013-02-26 (×2): qty 0.3

## 2013-02-26 MED ORDER — POTASSIUM CHLORIDE CRYS ER 20 MEQ PO TBCR
40.0000 meq | EXTENDED_RELEASE_TABLET | Freq: Once | ORAL | Status: AC
  Administered 2013-02-26: 40 meq via ORAL
  Filled 2013-02-26: qty 2

## 2013-02-26 MED ORDER — ASPIRIN 325 MG PO TABS
325.0000 mg | ORAL_TABLET | Freq: Every day | ORAL | Status: DC
  Administered 2013-02-26 – 2013-02-28 (×3): 325 mg via ORAL
  Filled 2013-02-26 (×4): qty 1

## 2013-02-26 MED ORDER — ENSURE PUDDING PO PUDG
1.0000 | Freq: Two times a day (BID) | ORAL | Status: DC
  Administered 2013-02-28: 1 via ORAL

## 2013-02-26 MED ORDER — DOCUSATE SODIUM 100 MG PO CAPS
100.0000 mg | ORAL_CAPSULE | ORAL | Status: DC | PRN
  Filled 2013-02-26: qty 1

## 2013-02-26 MED ORDER — DEXTROSE 5 % IV SOLN
1.0000 g | INTRAVENOUS | Status: DC
  Administered 2013-02-26: 1 g via INTRAVENOUS
  Filled 2013-02-26 (×2): qty 10

## 2013-02-26 MED ORDER — OCUVITE-LUTEIN PO CAPS
1.0000 | ORAL_CAPSULE | Freq: Every day | ORAL | Status: DC
  Administered 2013-02-27 – 2013-02-28 (×2): 1 via ORAL
  Filled 2013-02-26 (×4): qty 1

## 2013-02-26 MED ORDER — OMEGA-3-ACID ETHYL ESTERS 1 G PO CAPS
1.0000 | ORAL_CAPSULE | Freq: Two times a day (BID) | ORAL | Status: DC
  Administered 2013-02-26 – 2013-02-28 (×6): 1 g via ORAL
  Filled 2013-02-26 (×9): qty 1

## 2013-02-26 MED ORDER — POTASSIUM CHLORIDE CRYS ER 20 MEQ PO TBCR
40.0000 meq | EXTENDED_RELEASE_TABLET | Freq: Once | ORAL | Status: AC
  Administered 2013-02-26: 40 meq via ORAL

## 2013-02-26 MED ORDER — FUROSEMIDE 10 MG/ML IJ SOLN
40.0000 mg | Freq: Every day | INTRAMUSCULAR | Status: DC
  Administered 2013-02-26: 40 mg via INTRAVENOUS
  Filled 2013-02-26 (×2): qty 4

## 2013-02-26 MED ORDER — ALBUTEROL SULFATE (2.5 MG/3ML) 0.083% IN NEBU
2.5000 mg | INHALATION_SOLUTION | Freq: Four times a day (QID) | RESPIRATORY_TRACT | Status: DC | PRN
  Administered 2013-02-27: 2.5 mg via RESPIRATORY_TRACT
  Filled 2013-02-26: qty 3

## 2013-02-26 MED ORDER — SODIUM CHLORIDE 0.9 % IJ SOLN
3.0000 mL | Freq: Two times a day (BID) | INTRAMUSCULAR | Status: DC
  Administered 2013-02-27 – 2013-02-28 (×3): 3 mL via INTRAVENOUS

## 2013-02-26 MED ORDER — DEXTROSE 5 % IV SOLN
500.0000 mg | INTRAVENOUS | Status: DC
  Administered 2013-02-27 – 2013-03-01 (×3): 500 mg via INTRAVENOUS
  Filled 2013-02-26 (×4): qty 500

## 2013-02-26 NOTE — H&P (Addendum)
[facility] History and Physical  Danielle Adams ZOX:[id number] DOB: 1916-09-20 DOA: 02/25/2013  Referring physician: ER physician. PCP: Illene Regulus, MD   Chief Complaint: Shortness of breath.  HPI: Danielle Adams is a 80 y.o. female with history of hypertension, hypothyroidism, paroxysmal atrial fibrillation, CHF was brought to the ER after patient was having increasing shortness of breath over the last one week. Patient was also noticed to be having increasing productive cough. Patient also has pleuritic type of chest pain on the right side. In the ER patient was noted to have elevated white cell count with elevated BNP and chest x-ray features concerning for pneumonia. Patient has been started on empiric antibiotics for pneumonia and one dose of Lasix 40 mg IV was given. On exam patient also has lower extremity edema. Patient otherwise denies any nausea vomiting abdominal pain diarrhea fever chills. Patient states her daughter had similar symptoms last week.  Initially patient was on 2 L oxygen and at this time patient is on 6 L but patient is not in acute distress. I have ordered one more dose of Lasix 40 mg IV.  Review of Systems: As presented in the history of presenting illness, rest negative.  Past Medical History  Diagnosis Date  . Rapid heart rate     arrythmia  . HTN (hypertension)   . Familial tremor   . Allergic rhinitis, cause unspecified 01/25/2011  . Thyroid disease    Past Surgical History  Procedure Laterality Date  . Tonsillectomy    . Dilation and curettage of uterus    . Bunionectomy    . Laparoscopic ovarian cystectomy  04/2009   Social History:  reports that she has never smoked. She does not have any smokeless tobacco history on file. She reports that she does not drink alcohol or use illicit drugs. Where does patient live home. Can patient participate in ADLs? Yes.  No Known Allergies  Family History:  Family History  Problem Relation Age of  Onset  . Pancreatic cancer Mother   . Coronary artery disease Father   . Diabetes Sister   . Diabetes Brother       Prior to Admission medications   Medication Sig Start Date End Date Taking? Authorizing Provider  aspirin 325 MG tablet Take 325 mg by mouth daily.     Historical Provider, MD  Calcium Carbonate-Vitamin D (CALTRATE 600+D) 600-400 MG-UNIT per tablet Take 1 tablet by mouth 2 (two) times daily.      Historical Provider, MD  diltiazem (DILACOR XR) 180 MG 24 hr capsule TAKE ONE CAPLET ONE TIME DAILY 07/16/12   Jacques Navy, MD  docusate sodium (COLACE) 100 MG capsule Take 1 capsule (100 mg total) by mouth as needed. Stool Softener 06/22/10   Jacques Navy, MD  furosemide (LASIX) 40 MG tablet Take 1 tablet (40 mg total) by mouth daily. 07/16/12   Jacques Navy, MD  hydrocortisone-pramoxine [facility]) 2.5-1 % rectal cream PLACE RECTALLY TWICE DAILY 07/16/12   Jacques Navy, MD  labetalol (NORMODYNE) 200 MG tablet TAKE 1 TABLET BY MOUTH TWICE DAILY 07/16/12   Jacques Navy, MD  methimazole (TAPAZOLE) 5 MG tablet TAKE 1 BY MOUTH EVERY DAY 07/16/12   Jacques Navy, MD  Multiple Vitamins-Minerals (EYE VITAMINS) CAPS Take 1 each by mouth daily.      Historical Provider, MD  OMEGA 3 1000 MG CAPS Take 1 capsule by mouth 2 (two) times daily.     Historical Provider, MD  Physical Exam: Filed Vitals:   02/25/13 2300 02/25/13 2325 02/25/13 2330 02/26/13 0043  BP:  140/54  124/73  Pulse: 57 67 69 68  Temp:    98.1 F (36.7 C)  TempSrc:    Oral  Resp: 21 14 27    Height:    5\' 6"  (1.676 m)  Weight:    68.357 kg (150 lb 11.2 oz)  SpO2: 92% 92% 89% 93%     General:  Well-developed and nourished.  Eyes: Anicteric no pallor.  ENT: No discharge from the ears eyes nose mouth.  Neck: No mass felt. JVD not appreciated.  Cardiovascular: S1-S2 heard.  Respiratory: No rhonchi or crepitations.  Abdomen: Soft nontender bowel sounds present.  Skin: No  rash.  Musculoskeletal: Bilateral lower extremity edema more on the right side.  Psychiatric: Appears normal.  Neurologic: Alert awake oriented to time place and person. Moves all extremities.  Labs on Admission:  Basic Metabolic Panel:  Recent Labs Lab 02/25/13 2055  NA 138  K 3.3*  CL 96  CO2 24  GLUCOSE 160*  BUN 26*  CREATININE 1.10  CALCIUM 9.5   Liver Function Tests:  Recent Labs Lab 02/25/13 2055  AST 18  ALT 24  ALKPHOS 112  BILITOT 0.9  PROT 6.4  ALBUMIN 2.7*   No results found for this basename: LIPASE, AMYLASE,  in the last 168 hours No results found for this basename: AMMONIA,  in the last 168 hours CBC:  Recent Labs Lab 02/25/13 2055  WBC 16.0*  NEUTROABS 13.2*  HGB 11.1*  HCT 33.4*  MCV 84.6  PLT 209   Cardiac Enzymes:  Recent Labs Lab 02/25/13 2055  TROPONINI <0.30    BNP (last 3 results)  Recent Labs  02/25/13 2055  PROBNP 10383.0*   CBG: No results found for this basename: GLUCAP,  in the last 168 hours  Radiological Exams on Admission: Dg Chest 2 View  02/25/2013   CLINICAL DATA:  Weakness and shortness of breath. History of hypertension.  EXAM: CHEST  2 VIEW  COMPARISON:  Chest radiograph May 10, 2011  FINDINGS: New right middle lobe consolidation, with minimal patchy bibasilar airspace opacities. Blunting of the right costophrenic angle. No pneumothorax.  Stable cardiomegaly. Calcified aortic knob, mediastinal silhouette is otherwise unremarkable. Increasing interstitial prominence. Remote right lateral rib fractures. Osteopenia. Multiple EKG lines overlie the patient and may obscure subtle underlying pathology.  IMPRESSION: Right middle lobe consolidation concerning for pneumonia, with bibasilar patchy airspace opacities suggesting atelectasis with right lung base pleural thickening. Recommend followup chest radiograph after treatment to verify improvement.  Cardiomegaly, interstitial prominence suggesting COPD or possible  edema.   Electronically Signed   By: Awilda Metro   On: 02/25/2013 22:09    EKG: Independently reviewed. Atrial fibrillation with RBBB with rate controlled.  Assessment/Plan Principal Problem:   Acute respiratory failure Active Problems:   HYPERTHYROIDISM   HYPERTENSION   ATRIAL FIBRILLATION, PAROXYSMAL   Community acquired pneumonia   Congestive heart failure   Pneumonia   1. Acute respiratory failure secondary to pneumonia and CHF - patient has been placed on ceftriaxone and Zithromax for community-acquired pneumonia. I have ordered influenza PCR urine strep and Legionella antigen. I have ordered another dose of Lasix 40 mg IV now and placed on daily dose of Lasix 40 daily. Since patient's right leg is more swollen than the left have ordered Doppler of the lower extremities to check for DVT. 2. Hyperthyroidism - patient has not had any symptoms of  hyperthyroidism at this time. Continue methimazole. Check TSH. 3. Hypertension - presently controlled. Continue home medications. 4. History of CHF - last EF measured was in April 2013 was 55-60%. See #1. 5. Paroxysmal atrial fibrillation presently rate controlled - patient on aspirin and Cardizem. 6. Hyperglycemia - check hemoglobin A1c. 7. Pleuritic type of chest pain - probably from pneumonia.  I have reviewed patient's old charts and compared old labs.    Code Status: DO NOT RESUSCITATE.  Family Communication: None.  Disposition Plan: Admit to inpatient.    Donaldo Teegarden N. [facility] Pager [phone].  If 7PM-7AM, please contact night-coverage [email or web address] Password [email or web address] 02/26/2013, 3:09 AM

## 2013-02-26 NOTE — Consult Note (Signed)
CONSULTATION NOTE  Reason for Consult: Heart failure  Requesting Physician: Dr. Tyrell Antonio  Cardiologist: None  HPI: This is a 80 y.o. female with a past medical history significant for hypertension, hypothyroidism, paroxysmal atrial fibrillation, and congestive heart failure. Last echocardiogram in the computer system demonstrated LVEF of 55-60% with indeterminate diastolic function, mild AI and MR. She was brought to the ER for increasing shortness of breath over the last one week. Patient was also noticed to be having increasing productive cough. Patient also has pleuritic type of chest pain on the right side. In the ER patient was noted to have elevated white cell count with elevated BNP and chest x-ray features concerning for pneumonia. Patient has been started on empiric antibiotics for pneumonia and one dose of Lasix 40 mg IV was given. On exam patient also has lower extremity edema. Patient otherwise denies any nausea vomiting abdominal pain diarrhea fever chills. Patient states her daughter had similar symptoms last week. Initially patient was on 2 L oxygen and at this time patient is on 6 L but patient is not in acute distress. Additional lasix was given and we were consulted for management.  PMHx:  Past Medical History  Diagnosis Date  . Rapid heart rate     arrythmia  . HTN (hypertension)   . Familial tremor   . Allergic rhinitis, cause unspecified 01/25/2011  . Thyroid disease    Past Surgical History  Procedure Laterality Date  . Tonsillectomy    . Dilation and curettage of uterus    . Bunionectomy    . Laparoscopic ovarian cystectomy  04/2009    FAMHx: Family History  Problem Relation Age of Onset  . Pancreatic cancer Mother   . Coronary artery disease Father   . Diabetes Sister   . Diabetes Brother     SOCHx:  reports that she has never smoked. She does not have any smokeless tobacco history on file. She reports that she does not drink alcohol or use  illicit drugs.  ALLERGIES: No Known Allergies  ROS: A comprehensive review of systems was negative except for: Constitutional: positive for anorexia, fatigue and fevers Respiratory: positive for cough, dyspnea on exertion and rhonchi Cardiovascular: positive for dyspnea, irregular heart beat and lower extremity edema Neurological: positive for tremors  HOME MEDICATIONS: Prescriptions prior to admission  Medication Sig Dispense Refill  . aspirin 325 MG tablet Take 325 mg by mouth daily.       . Calcium Carbonate-Vitamin D (CALTRATE 600+D) 600-400 MG-UNIT per tablet Take 1 tablet by mouth 2 (two) times daily.       Marland Kitchen diltiazem (DILACOR XR) 180 MG 24 hr capsule Take 180 mg by mouth daily.      Marland Kitchen docusate sodium (COLACE) 100 MG capsule Take 1 capsule (100 mg total) by mouth as needed. Stool Softener  10 capsule  3  . furosemide (LASIX) 40 MG tablet Take 1 tablet (40 mg total) by mouth daily.  90 tablet  3  . labetalol (NORMODYNE) 200 MG tablet Take 200 mg by mouth 2 (two) times daily.      . methimazole (TAPAZOLE) 5 MG tablet Take 5 mg by mouth daily.      . Multiple Vitamins-Minerals (EYE VITAMINS) CAPS Take 1 each by mouth daily.       . OMEGA 3 1000 MG CAPS Take 1 capsule by mouth 2 (two) times daily.         HOSPITAL MEDICATIONS: Scheduled: . aspirin  325 mg Oral  Daily  . [START ON 02/27/2013] azithromycin  500 mg Intravenous Q24H  . calcium-vitamin D  1 tablet Oral BID WC  . cefTRIAXone (ROCEPHIN)  IV  1 g Intravenous Q24H  . diltiazem  180 mg Oral Daily  . enoxaparin (LOVENOX) injection  30 mg Subcutaneous Q24H  . furosemide  40 mg Intravenous Daily  . hydrocortisone-pramoxine   Rectal BID  . labetalol  200 mg Oral BID  . methimazole  5 mg Oral Daily  . multivitamin-lutein  1 capsule Oral Daily  . omega-3 acid ethyl esters  1 capsule Oral BID  . sodium chloride  3 mL Intravenous Q12H  . sodium chloride  3 mL Intravenous Q12H    VITALS: Blood pressure 129/53, pulse 70,  temperature 101.9 F (38.8 C), temperature source Rectal, resp. rate 27, height _0  (1.676 m), weight 150 lb 11.2 oz (68.357 kg), SpO2 92.00%.  PHYSICAL EXAM: General appearance: alert, appears stated age and tremulous, in mild respiratory distress on NRB Neck: no carotid bruit and no JVD Lungs: diminished breath sounds bilaterally and rhonchi bilateral, upper airway Heart: irregularly irregular rhythm, S1, S2 normal and diastolic murmur: early diastolic 2/6, crescendo at 2nd right intercostal space Abdomen: soft, non-tender; bowel sounds normal; no masses,  no organomegaly Extremities: edema 1+ chronic stasis, legs are warm Pulses: 2+ and symmetric Skin: pale, warm, dry Neurologic: Mental status: Alert, oriented, thought content appropriate, follows commands Psych: Pleasant  LABS: Results for orders placed during the hospital encounter of 02/25/13 (from the past 48 hour(s))  CBC WITH DIFFERENTIAL     Status: Abnormal   Collection Time    02/25/13  8:55 PM      Result Value Range   WBC 16.0 (*) 4.0 - 10.5 K/uL   RBC 3.95  3.87 - 5.11 MIL/uL   Hemoglobin 11.1 (*) 12.0 - 15.0 g/dL   HCT 33.4 (*) 36.0 - 46.0 %   MCV 84.6  78.0 - 100.0 fL   MCH 28.1  26.0 - 34.0 pg   MCHC 33.2  30.0 - 36.0 g/dL   RDW 14.5  11.5 - 15.5 %   Platelets 209  150 - 400 K/uL   Neutrophils Relative % 75  43 - 77 %   Lymphocytes Relative 11 (*) 12 - 46 %   Monocytes Relative 6  3 - 12 %   Eosinophils Relative 0  0 - 5 %   Basophils Relative 0  0 - 1 %   Band Neutrophils 7  0 - 10 %   Myelocytes 1     Neutro Abs 13.2 (*) 1.7 - 7.7 K/uL   Lymphs Abs 1.8  0.7 - 4.0 K/uL   Monocytes Absolute 1.0  0.1 - 1.0 K/uL   Eosinophils Absolute 0.0  0.0 - 0.7 K/uL   Basophils Absolute 0.0  0.0 - 0.1 K/uL   RBC Morphology ELLIPTOCYTES     WBC Morphology TOXIC GRANULATION     Smear Review LARGE PLATELETS PRESENT    COMPREHENSIVE METABOLIC PANEL     Status: Abnormal   Collection Time    02/25/13  8:55 PM       Result Value Range   Sodium 138  137 - 147 mEq/L   Potassium 3.3 (*) 3.7 - 5.3 mEq/L   Chloride 96  96 - 112 mEq/L   CO2 24  19 - 32 mEq/L   Glucose, Bld 160 (*) 70 - 99 mg/dL   BUN 26 (*) 6 - 23 mg/dL  Creatinine, Ser 1.10  0.50 - 1.10 mg/dL   Calcium 9.5  8.4 - 10.5 mg/dL   Total Protein 6.4  6.0 - 8.3 g/dL   Albumin 2.7 (*) 3.5 - 5.2 g/dL   AST 18  0 - 37 U/L   ALT 24  0 - 35 U/L   Alkaline Phosphatase 112  39 - 117 U/L   Total Bilirubin 0.9  0.3 - 1.2 mg/dL   GFR calc non Af Amer 41 (*) >90 mL/min   GFR calc Af Amer 48 (*) >90 mL/min   Comment: (NOTE)     The eGFR has been calculated using the CKD EPI equation.     This calculation has not been validated in all clinical situations.     eGFR's persistently <90 mL/min signify possible Chronic Kidney     Disease.  TROPONIN I     Status: None   Collection Time    02/25/13  8:55 PM      Result Value Range   Troponin I <0.30  <0.30 ng/mL   Comment:            Due to the release kinetics of cTnI,     a negative result within the first hours     of the onset of symptoms does not rule out     myocardial infarction with certainty.     If myocardial infarction is still suspected,     repeat the test at appropriate intervals.  PRO B NATRIURETIC PEPTIDE     Status: Abnormal   Collection Time    02/25/13  8:55 PM      Result Value Range   Pro B Natriuretic peptide (BNP) 10383.0 (*) 0 - 450 pg/mL  URINALYSIS, ROUTINE W REFLEX MICROSCOPIC     Status: Abnormal   Collection Time    02/25/13 10:43 PM      Result Value Range   Color, Urine AMBER (*) YELLOW   Comment: BIOCHEMICALS MAY BE AFFECTED BY COLOR   APPearance CLOUDY (*) CLEAR   Specific Gravity, Urine 1.022  1.005 - 1.030   pH 5.5  5.0 - 8.0   Glucose, UA NEGATIVE  NEGATIVE mg/dL   Hgb urine dipstick NEGATIVE  NEGATIVE   Bilirubin Urine SMALL (*) NEGATIVE   Ketones, ur 15 (*) NEGATIVE mg/dL   Protein, ur 30 (*) NEGATIVE mg/dL   Urobilinogen, UA 1.0  0.0 - 1.0 mg/dL    Nitrite NEGATIVE  NEGATIVE   Leukocytes, UA SMALL (*) NEGATIVE  URINE MICROSCOPIC-ADD ON     Status: Abnormal   Collection Time    02/25/13 10:43 PM      Result Value Range   Squamous Epithelial / LPF FEW (*) RARE   WBC, UA 3-6  <3 WBC/hpf   Bacteria, UA MANY (*) RARE  INFLUENZA PANEL BY PCR (TYPE A & B, H1N1)     Status: None   Collection Time    02/26/13  2:38 AM      Result Value Range   Influenza A By PCR NEGATIVE  NEGATIVE   Influenza B By PCR NEGATIVE  NEGATIVE   H1N1 flu by pcr NOT DETECTED  NOT DETECTED   Comment:            The Xpert Flu assay (FDA approved for     nasal aspirates or washes and     nasopharyngeal swab specimens), is     intended as an aid in the diagnosis of     influenza  and should not be used as     a sole basis for treatment.  STREP PNEUMONIAE URINARY ANTIGEN     Status: Abnormal   Collection Time    02/26/13  5:20 AM      Result Value Range   Strep Pneumo Urinary Antigen POSITIVE (*) NEGATIVE  LEGIONELLA ANTIGEN, URINE     Status: None   Collection Time    02/26/13  5:20 AM      Result Value Range   Specimen Description URINE, RANDOM     Special Requests NONE     Legionella Antigen, Urine       Value: Negative for Legionella pneumophilia serogroup 1     Performed at [facility]   Report Status 02/26/2013 FINAL    COMPREHENSIVE METABOLIC PANEL     Status: Abnormal   Collection Time    02/26/13  8:45 AM      Result Value Range   Sodium 139  137 - 147 mEq/L   Potassium 3.7  3.7 - 5.3 mEq/L   Chloride 99  96 - 112 mEq/L   CO2 25  19 - 32 mEq/L   Glucose, Bld 168 (*) 70 - 99 mg/dL   BUN 21  6 - 23 mg/dL   Creatinine, Ser 0.97  0.50 - 1.10 mg/dL   Calcium 8.6  8.4 - 10.5 mg/dL   Total Protein 5.7 (*) 6.0 - 8.3 g/dL   Albumin 2.4 (*) 3.5 - 5.2 g/dL   AST 16  0 - 37 U/L   ALT 20  0 - 35 U/L   Alkaline Phosphatase 106  39 - 117 U/L   Total Bilirubin 0.9  0.3 - 1.2 mg/dL   GFR calc non Af Amer 48 (*) >90 mL/min   GFR calc Af Amer  56 (*) >90 mL/min   Comment: (NOTE)     The eGFR has been calculated using the CKD EPI equation.     This calculation has not been validated in all clinical situations.     eGFR's persistently <90 mL/min signify possible Chronic Kidney     Disease.  MAGNESIUM     Status: None   Collection Time    02/26/13  8:45 AM      Result Value Range   Magnesium 1.8  1.5 - 2.5 mg/dL  CBC WITH DIFFERENTIAL     Status: Abnormal   Collection Time    02/26/13  8:45 AM      Result Value Range   WBC 16.5 (*) 4.0 - 10.5 K/uL   RBC 3.78 (*) 3.87 - 5.11 MIL/uL   Hemoglobin 10.7 (*) 12.0 - 15.0 g/dL   HCT 32.0 (*) 36.0 - 46.0 %   MCV 84.7  78.0 - 100.0 fL   MCH 28.3  26.0 - 34.0 pg   MCHC 33.4  30.0 - 36.0 g/dL   RDW 14.7  11.5 - 15.5 %   Platelets 197  150 - 400 K/uL   Neutrophils Relative % 86 (*) 43 - 77 %   Neutro Abs 14.3 (*) 1.7 - 7.7 K/uL   Lymphocytes Relative 6 (*) 12 - 46 %   Lymphs Abs 1.0  0.7 - 4.0 K/uL   Monocytes Relative 7  3 - 12 %   Monocytes Absolute 1.2 (*) 0.1 - 1.0 K/uL   Eosinophils Relative 0  0 - 5 %   Eosinophils Absolute 0.0  0.0 - 0.7 K/uL   Basophils Relative 0  0 - 1 %  Basophils Absolute 0.0  0.0 - 0.1 K/uL  TROPONIN I     Status: None   Collection Time    02/26/13  8:45 AM      Result Value Range   Troponin I <0.30  <0.30 ng/mL   Comment:            Due to the release kinetics of cTnI,     a negative result within the first hours     of the onset of symptoms does not rule out     myocardial infarction with certainty.     If myocardial infarction is still suspected,     repeat the test at appropriate intervals.    IMAGING: Dg Chest 2 View  02/25/2013   CLINICAL DATA:  Weakness and shortness of breath. History of hypertension.  EXAM: CHEST  2 VIEW  COMPARISON:  Chest radiograph May 10, 2011  FINDINGS: New right middle lobe consolidation, with minimal patchy bibasilar airspace opacities. Blunting of the right costophrenic angle. No pneumothorax.  Stable  cardiomegaly. Calcified aortic knob, mediastinal silhouette is otherwise unremarkable. Increasing interstitial prominence. Remote right lateral rib fractures. Osteopenia. Multiple EKG lines overlie the patient and may obscure subtle underlying pathology.  IMPRESSION: Right middle lobe consolidation concerning for pneumonia, with bibasilar patchy airspace opacities suggesting atelectasis with right lung base pleural thickening. Recommend followup chest radiograph after treatment to verify improvement.  Cardiomegaly, interstitial prominence suggesting COPD or possible edema.   Electronically Signed   By: Elon Alas   On: 02/25/2013 22:09    HOSPITAL DIAGNOSES: Principal Problem:   Pneumococcal pneumonia Active Problems:   HYPERTHYROIDISM   HYPERTENSION   ATRIAL FIBRILLATION, PAROXYSMAL   Community acquired pneumonia   Acute on chronic diastolic congestive heart failure, NYHA class 4   Acute respiratory failure   IMPRESSION: 1. Pneumococcal pneumonia 2. Mild acute on chronic, probably diastolic congestive heart failure 3. Paroxysmal atrial fibrillation - rate controlled (noted first in 2010)  RECOMMENDATION: 1. I do not feel that she is significantly volume overloaded. I would avoid additional IV lasix today - urine output has been minimal, despite elevated BNP. Her LE swelling is due to chronic venous insufficiency. CXR more consistent with PNA, than heart failure. I would follow I's/O's today and dose lasix accordingly again tomorrow. A repeat echocardiogram is recommended to r/o any new systolic heart failure.  She has expressed DNR status (also DNI) - I would favor conservative management given her advanced age.  She is not a warfarin or NOAC candidate given her age and frailty. I would recommend continuing aspirin.  Thanks for the consult. We will continue to follow with you.  Time Spent Directly with Patient: 45 minutes  Pixie Casino, MD, [facility] Attending Cardiologist [facility]  Shanteria Laye C 02/26/2013, 1:31 PM

## 2013-02-26 NOTE — Progress Notes (Signed)
Second set of eyes assessment with RN Chassidy.  Patient alert - hot to touch - in mild/moderate distress - can speak but broken sentences - denies real SOB but says it is sore to breathe - bil BS present - few fine crackles noted worse on right side - O2 at 6 liter nasal cannula - RR 24-28 - O2 sats 90% - desats easily with activity - to 86%.  Placed on 50 % venti mask.  Rectal temp 101.7.  BP 122/68  HR 83 afib. Foley patent - had Lasix at 0300 - dark urine present.  No pedal edema noted.  BNP elevated last pm.  Dr. Sunnie Nielsenegalado present - will place NRB mask for comfort and maximize O2 - patient tol well.  RN Chassidy to give extra Lasix.  Spoke with grandaughter Matthew FolksBerkley - will support as needed.

## 2013-02-26 NOTE — Progress Notes (Signed)
UR Completed Kaladin Noseworthy Graves-Bigelow, RN,BSN [phone]  

## 2013-02-26 NOTE — Progress Notes (Signed)
ANTIBIOTIC CONSULT NOTE - INITIAL  Pharmacy Consult for Ceftriaxone  Indication: rule out pneumonia  No Known Allergies  Patient Measurements: Height: 5\' 6"  (167.6 cm) Weight: 150 lb 11.2 oz (68.357 kg) IBW/kg (Calculated) : 59.3  Vital Signs: Temp: 98.1 F (36.7 C) (01/21 0043) Temp src: Oral (01/21 0043) BP: 124/73 mmHg (01/21 0043) Pulse Rate: 68 (01/21 0043)  Labs:  Recent Labs  02/25/13 2055  WBC 16.0*  HGB 11.1*  PLT 209  CREATININE 1.10   Medical History: Past Medical History  Diagnosis Date  . Rapid heart rate     arrythmia  . HTN (hypertension)   . Familial tremor   . Allergic rhinitis, cause unspecified 01/25/2011  . Thyroid disease    Assessment: 80 y/o F here with fatigue, CXR concerning for PNA, labs as above.   Goal of Therapy:  Clinical resolution   Plan:  -Ceftriaxone 1g IV q24h -Azithromycin per MD -Trend WBC, temp, renal function  -F/U cultures, imaging  Danielle Adams, Danielle Adams 02/26/2013,3:13 AM

## 2013-02-26 NOTE — Progress Notes (Signed)
  Echocardiogram 2D Echocardiogram has been performed.  Danielle Adams 02/26/2013, 3:56 PM 

## 2013-02-26 NOTE — Progress Notes (Signed)
Thank you for consulting the Palliative Medicine Team at [facility] to meet your patient's and family's needs.   The reason that you asked us to see your patient is  For Clarification of GOC and options  We have scheduled your patient for a meeting: Friday February 28, 2013 at 1600/ 400 pm with Dr Derenda MisMelissa Taylor  The Surrogate decision make is: Danielle Adams  # [phone] (daughter)  Other family members that need to be present:    Synetta Failnita will gather all pertinent family members  Your patient is able/unable to participate: yes   Lorinda CreedMary Larach NP  Palliative Medicine Team Team Phone # [phone] Pager [phone]

## 2013-02-26 NOTE — Care Management Note (Signed)
    Page 1 of 1   03/06/2013     1:51:52 PM   CARE MANAGEMENT NOTE 03/06/2013  Patient:  Danielle Adams,Danielle Adams   Account Number:  [id number]  Date Initiated:  02/26/2013  Documentation initiated by:  GRAVES-BIGELOW,Lc Joynt  Subjective/Objective Assessment:   Pt admitted for Shortness of breath. Treating for PNA with IV ABX. Per MD plan will be for Palliative Care consult for GOC.     Action/Plan:   CM will continue to monitor for disposiiton needs.   Anticipated DC Date:  03/01/2013   Anticipated DC Plan:  HOME W HOME HEALTH SERVICES      DC Planning Services  CM consult      Choice offered to / List presented to:             Status of service:  Completed, signed off Medicare Important Message given?   (If response is "NO", the following Medicare IM given date fields will be blank) Date Medicare IM given:   Date Additional Medicare IM given:    Discharge Disposition:  SKILLED NURSING FACILITY  Per UR Regulation:  Reviewed for med. necessity/level of care/duration of stay  If discussed at Long Length of Stay Meetings, dates discussed:   03/04/2013  03/06/2013    Comments:  03-06-13 1349 Tomi BambergerBrenda Graves-Bigelow, RN,BSN [phone] Pt was discussed in [facility] meeting this am. We discussed possibility of LTAC. At this time pt is not appropriate for LTAC will still plan for SNF at  d/c. CM will continue to monitor.    03-03-13 1601 Tomi BambergerBrenda Graves-Bigelow, RN,BSN [phone] Pt still has IV ABX infusing. Plan may be for SNF when medically stable for d/c. CM will continue to monitor.

## 2013-02-26 NOTE — Progress Notes (Addendum)
[facility] PROGRESS NOTE  Danielle Adams EVO:[id number] DOB: 14-Sep-1916 DOA: 02/25/2013 PCP: Adella Hare, MD  Assessment/Plan: 1-Acute Respiratory Failure: Secondary to PNA and Heart failure exacerbation.  Continue with IV antibiotics, ceftriaxone and Azithromycin.  Strep Pneumonia positive.  Continue with lasix 40 mg IV daily.  Will give extra dose lasix now.  Now on NB Mask.   Cardio consulted.  Nebulizer treatments.   2-History of CHF - last EF measured was in April 2013 was 55-60%. Continue with lasix. Cardio consulted. BNP on admission at 10,383. Cycle cardiac enzymes.  3-Paroxysmal atrial fibrillation presently rate controlled - patient on aspirin and Cardizem.  4-Hyperglycemia - check hemoglobin A1c.  5-Pleuritic type of chest pain - probably from pneumonia. 6-Hyperthyroidism -  Continue methimazole. Check TSH. 7-Hypokalemia: replete. B-met pending for this morning.   Code Status: DNR/DNI Family Communication: Care discussed with Granddaughter,  Disposition Plan: Remain inpatient. Cardiology consult to help with heart failure management. Palliative care consult for goals of care.    Consultants:  Cardiology  Palliative  Procedures:  none  Antibiotics:  Ceftriaxone  Azithromycin 1-21  HPI/Subjective: Related dyspnea, cough.  Hard to breath. No chest pain.   Objective: Filed Vitals:   02/26/13 0501  BP: 129/53  Pulse: 70  Temp: 98 F (36.7 C)  Resp:     Intake/Output Summary (Last 24 hours) at 02/26/13 0900 Last data filed at 02/26/13 0521  Gross per 24 hour  Intake    400 ml  Output    900 ml  Net   -500 ml   Filed Weights   02/26/13 0043  Weight: 68.357 kg (150 lb 11.2 oz)    Exam:   General:  Mild distress.   Cardiovascular: S 1, S 2 RRR  Respiratory: Bilateral ronchus and crackles.   Abdomen: BS present, soft, NT  Musculoskeletal: trace edema.   Data Reviewed: Basic Metabolic Panel:  Recent Labs Lab 02/25/13 2055   NA 138  K 3.3*  CL 96  CO2 24  GLUCOSE 160*  BUN 26*  CREATININE 1.10  CALCIUM 9.5   Liver Function Tests:  Recent Labs Lab 02/25/13 2055  AST 18  ALT 24  ALKPHOS 112  BILITOT 0.9  PROT 6.4  ALBUMIN 2.7*   No results found for this basename: LIPASE, AMYLASE,  in the last 168 hours No results found for this basename: AMMONIA,  in the last 168 hours CBC:  Recent Labs Lab 02/25/13 2055  WBC 16.0*  NEUTROABS 13.2*  HGB 11.1*  HCT 33.4*  MCV 84.6  PLT 209   Cardiac Enzymes:  Recent Labs Lab 02/25/13 2055  TROPONINI <0.30   BNP (last 3 results)  Recent Labs  02/25/13 2055  PROBNP 10383.0*   CBG: No results found for this basename: GLUCAP,  in the last 168 hours  No results found for this or any previous visit (from the past 240 hour(s)).   Studies: Dg Chest 2 View  02/25/2013   CLINICAL DATA:  Weakness and shortness of breath. History of hypertension.  EXAM: CHEST  2 VIEW  COMPARISON:  Chest radiograph May 10, 2011  FINDINGS: New right middle lobe consolidation, with minimal patchy bibasilar airspace opacities. Blunting of the right costophrenic angle. No pneumothorax.  Stable cardiomegaly. Calcified aortic knob, mediastinal silhouette is otherwise unremarkable. Increasing interstitial prominence. Remote right lateral rib fractures. Osteopenia. Multiple EKG lines overlie the patient and may obscure subtle underlying pathology.  IMPRESSION: Right middle lobe consolidation concerning for pneumonia, with bibasilar patchy airspace opacities  suggesting atelectasis with right lung base pleural thickening. Recommend followup chest radiograph after treatment to verify improvement.  Cardiomegaly, interstitial prominence suggesting COPD or possible edema.   Electronically Signed   By: Elon Alas   On: 02/25/2013 22:09    Scheduled Meds: . aspirin  325 mg Oral Daily  . [START ON 02/27/2013] azithromycin  500 mg Intravenous Q24H  . calcium-vitamin D  1 tablet Oral  BID WC  . cefTRIAXone (ROCEPHIN)  IV  1 g Intravenous Q24H  . diltiazem  180 mg Oral Daily  . enoxaparin (LOVENOX) injection  30 mg Subcutaneous Q24H  . furosemide  40 mg Intravenous Daily  . furosemide  40 mg Intravenous Once  . hydrocortisone-pramoxine   Rectal BID  . labetalol  200 mg Oral BID  . methimazole  5 mg Oral Daily  . multivitamin-lutein  1 capsule Oral Daily  . omega-3 acid ethyl esters  1 capsule Oral BID  . potassium chloride  40 mEq Oral Once  . sodium chloride  3 mL Intravenous Q12H  . sodium chloride  3 mL Intravenous Q12H   Continuous Infusions:   Principal Problem:   Acute respiratory failure Active Problems:   HYPERTHYROIDISM   HYPERTENSION   ATRIAL FIBRILLATION, PAROXYSMAL   Community acquired pneumonia   Congestive heart failure   Pneumonia    Time spent: 35 minutes.     Bobbe Quilter  [facility] Pager [phone]. If 7PM-7AM, please contact night-coverage at [email or web address], password [facility] 02/26/2013, 9:00 AM  LOS: 1 day

## 2013-02-26 NOTE — Progress Notes (Signed)
Courtesy note [name] is very well known to me. Came by for social support.   She is very bright and alert. She is very clear about her wishes for care: DNR/DNI no ICU care; ok for medical therapy.  Spoke with family members: they know her wishes.

## 2013-02-26 NOTE — Progress Notes (Signed)
INITIAL NUTRITION ASSESSMENT  DOCUMENTATION CODES Per approved criteria  -Not Applicable   INTERVENTION: Provide Ensure Pudding BID after meals Provide Ensure Complete BID  NUTRITION DIAGNOSIS: Inadequate oral intake related to decreased appetite as evidenced by pt's daughters report.   Goal: Pt to meet >/= 90% of their estimated nutrition needs   Monitor:  PO intake Weight Labs GOC  Reason for Assessment: Malnutrition Screening Tool, score of 3  80 y.o. female  Admitting Dx: Pneumococcal pneumonia  ASSESSMENT: 80 y.o. female with history of hypertension, hypothyroidism, paroxysmal atrial fibrillation, CHF was brought to the ER after patient was having increasing shortness of breath over the last one week.  Pt asleep at time of visit with pt's daughter at bedside. Per pt's daughter pt has been sick for the past 10 days and has had a decreased appetite. Pt has been eating 3 meals daily but, very small amounts of food. Daughter has been feeding pt more yogurt per pt request but, now pt is getting tired of yogurt. Daughter is unsure of pt's usual body weight but, has not noticed any significant weight loss.   Height: Ht Readings from Last 1 Encounters:  02/26/13 5\' 6"  (1.676 m)    Weight: Wt Readings from Last 1 Encounters:  02/26/13 150 lb 11.2 oz (68.357 kg)    Ideal Body Weight: 130 lbs  % Ideal Body Weight: 115%  Wt Readings from Last 10 Encounters:  02/26/13 150 lb 11.2 oz (68.357 kg)  11/27/12 151 lb 12.8 oz (68.856 kg)  07/12/11 152 lb (68.947 kg)  05/16/11 153 lb (69.4 kg)  05/10/11 150 lb 9.2 oz (68.3 kg)  05/09/11 153 lb 4 oz (69.514 kg)  01/25/11 155 lb (70.308 kg)  07/22/10 155 lb 8 oz (70.534 kg)  06/22/10 154 lb (69.854 kg)  06/21/09 157 lb (71.215 kg)    Usual Body Weight: 150 lbs  % Usual Body Weight: 100%  BMI:  Body mass index is 24.34 kg/(m^2).  Estimated Nutritional Needs: Kcal: 1400-1600 Protein: 70-80 grams Fluid: 1.4-1.6  L/day  Skin: +2 RLE edema, +1 LLE edema  Diet Order: Cardiac  EDUCATION NEEDS: -No education needs identified at this time   Intake/Output Summary (Last 24 hours) at 02/26/13 1447 Last data filed at 02/26/13 0850  Gross per 24 hour  Intake    400 ml  Output   1250 ml  Net   -850 ml    Last BM: 1/20  Labs:   Recent Labs Lab 02/25/13 2055 02/26/13 0845  NA 138 139  K 3.3* 3.7  CL 96 99  CO2 24 25  BUN 26* 21  CREATININE 1.10 0.97  CALCIUM 9.5 8.6  MG  --  1.8  GLUCOSE 160* 168*    CBG (last 3)  No results found for this basename: GLUCAP,  in the last 72 hours  Scheduled Meds: . aspirin  325 mg Oral Daily  . [START ON 02/27/2013] azithromycin  500 mg Intravenous Q24H  . calcium-vitamin D  1 tablet Oral BID WC  . cefTRIAXone (ROCEPHIN)  IV  1 g Intravenous Q24H  . diltiazem  180 mg Oral Daily  . enoxaparin (LOVENOX) injection  30 mg Subcutaneous Q24H  . hydrocortisone-pramoxine   Rectal BID  . labetalol  200 mg Oral BID  . methimazole  5 mg Oral Daily  . multivitamin-lutein  1 capsule Oral Daily  . omega-3 acid ethyl esters  1 capsule Oral BID  . sodium chloride  3 mL Intravenous Q12H  .  sodium chloride  3 mL Intravenous Q12H    Continuous Infusions:   Past Medical History  Diagnosis Date  . Rapid heart rate     arrythmia  . HTN (hypertension)   . Familial tremor   . Allergic rhinitis, cause unspecified 01/25/2011  . Thyroid disease     Past Surgical History  Procedure Laterality Date  . Tonsillectomy    . Dilation and curettage of uterus    . Bunionectomy    . Laparoscopic ovarian cystectomy  04/2009    Ian Malkin RD, LDN Inpatient Clinical Dietitian Pager: [phone] After Hours Pager: [phone]

## 2013-02-27 DIAGNOSIS — Z66 Do not resuscitate: Secondary | ICD-10-CM | POA: Diagnosis not present

## 2013-02-27 DIAGNOSIS — R609 Edema, unspecified: Secondary | ICD-10-CM

## 2013-02-27 DIAGNOSIS — I509 Heart failure, unspecified: Secondary | ICD-10-CM | POA: Diagnosis not present

## 2013-02-27 DIAGNOSIS — I5033 Acute on chronic diastolic (congestive) heart failure: Secondary | ICD-10-CM | POA: Diagnosis not present

## 2013-02-27 DIAGNOSIS — I4891 Unspecified atrial fibrillation: Secondary | ICD-10-CM | POA: Diagnosis not present

## 2013-02-27 DIAGNOSIS — J189 Pneumonia, unspecified organism: Secondary | ICD-10-CM | POA: Diagnosis not present

## 2013-02-27 DIAGNOSIS — J9600 Acute respiratory failure, unspecified whether with hypoxia or hypercapnia: Secondary | ICD-10-CM | POA: Diagnosis not present

## 2013-02-27 LAB — BASIC METABOLIC PANEL
BUN: 27 mg/dL — ABNORMAL HIGH (ref 6–23)
CO2: 25 meq/L (ref 19–32)
Calcium: 8.7 mg/dL (ref 8.4–10.5)
Chloride: 102 mEq/L (ref 96–112)
Creatinine, Ser: 1.13 mg/dL — ABNORMAL HIGH (ref 0.50–1.10)
GFR calc Af Amer: 46 mL/min — ABNORMAL LOW (ref >90)
GFR calc non Af Amer: 40 mL/min — ABNORMAL LOW (ref >90)
GLUCOSE: 157 mg/dL — AB (ref 70–99)
POTASSIUM: 4.2 meq/L (ref 3.7–5.3)
SODIUM: 141 meq/L (ref 137–147)

## 2013-02-27 LAB — CBC
HCT: 31.3 % — ABNORMAL LOW (ref 36.0–46.0)
Hemoglobin: 10.6 g/dL — ABNORMAL LOW (ref 12.0–15.0)
MCH: 28.3 pg (ref 26.0–34.0)
MCHC: 33.9 g/dL (ref 30.0–36.0)
MCV: 83.7 fL (ref 78.0–100.0)
Platelets: 174 10*3/uL (ref 150–400)
RBC: 3.74 MIL/uL — AB (ref 3.87–5.11)
RDW: 15 % (ref 11.5–15.5)
WBC: 20.1 10*3/uL — ABNORMAL HIGH (ref 4.0–10.5)

## 2013-02-27 MED ORDER — GUAIFENESIN 100 MG/5ML PO SYRP
200.0000 mg | ORAL_SOLUTION | ORAL | Status: DC | PRN
  Filled 2013-02-27: qty 10

## 2013-02-27 MED ORDER — ENOXAPARIN SODIUM 80 MG/0.8ML ~~LOC~~ SOLN
70.0000 mg | SUBCUTANEOUS | Status: DC
  Administered 2013-02-27: 15:00:00 via SUBCUTANEOUS
  Administered 2013-02-28 – 2013-03-01 (×2): 70 mg via SUBCUTANEOUS
  Filled 2013-02-27 (×3): qty 0.8

## 2013-02-27 MED ORDER — DILTIAZEM HCL ER COATED BEADS 120 MG PO CP24
120.0000 mg | ORAL_CAPSULE | Freq: Every day | ORAL | Status: DC
  Administered 2013-02-27 – 2013-03-01 (×3): 120 mg via ORAL
  Filled 2013-02-27 (×3): qty 1

## 2013-02-27 MED ORDER — PIPERACILLIN-TAZOBACTAM 3.375 G IVPB
3.3750 g | Freq: Three times a day (TID) | INTRAVENOUS | Status: DC
  Administered 2013-02-27 – 2013-03-01 (×7): 3.375 g via INTRAVENOUS
  Filled 2013-02-27 (×9): qty 50

## 2013-02-27 NOTE — Progress Notes (Signed)
*  PRELIMINARY RESULTS* Vascular Ultrasound Lower extremity venous duplex has been completed.  Preliminary findings: DVT involving the right popliteal and posterior tibial veins. No DVT of the left leg.   Farrel DemarkJill Eunice, RDMS, RVT  02/27/2013, 11:04 AM

## 2013-02-27 NOTE — Progress Notes (Signed)
Subjective:  Family and attending MD in room.  Objective:  Vital Signs in the last 24 hours: Temp:  [98.4 F (36.9 C)-99.5 F (37.5 C)] 98.6 F (37 C) (01/22 0545) Pulse Rate:  [59-69] 66 (01/22 0545) Resp:  [17-20] 18 (01/22 0545) BP: (127-132)/(48-69) 132/50 mmHg (01/22 0545) SpO2:  [93 %-99 %] 97 % (01/22 0545)  Intake/Output from previous day: No intake or output data in the 24 hours ending 02/27/13 0935  Physical Exam: Pending   Rate: 50-70  Rhythm: atrial fibrillation  Lab Results:  Recent Labs  02/26/13 0845 02/27/13 0645  WBC 16.5* 20.1*  HGB 10.7* 10.6*  PLT 197 174    Recent Labs  02/26/13 0845 02/27/13 0645  NA 139 141  K 3.7 4.2  CL 99 102  CO2 25 25  GLUCOSE 168* 157*  BUN 21 27*  CREATININE 0.97 1.13*    Recent Labs  02/26/13 1715 02/26/13 2200  TROPONINI <0.30 <0.30   No results found for this basename: INR,  in the last 72 hours  Imaging: Imaging results have been reviewed  Cardiac Studies: Echo- 1/21/5- - Left ventricle: The cavity size was normal. Wall thicknesswas increased in a pattern of mild LVH. Systolic function was normal. The estimated ejection fraction was in the range of 60% to 65%. Wall motion was normal; there were no regional wall motion abnormalities.    Assessment/Plan:   Principal Problem:   Community acquired pneumonia Active Problems:   Acute respiratory failure   HYPERTENSION   ATRIAL FIBRILLATION, PAROXYSMAL   Acute on chronic diastolic congestive heart failure, NYHA class 4   HYPERTHYROIDISM   DNR (do not resuscitate)    PLAN:  MD to see. HR is a little slow, will cut back on Diltiazem.   Corine ShelterLuke Kilroy PA-C Beeper [phone] 02/27/2013, 9:35 AM  Personally seen and examined, agree with above. Atrial fibrillation, slightly bradycardic. We'll decrease diltiazem. Pneumonia, fever per primary team. Antibiotics. She currently does not appear volume overloaded. We will hold off on Lasix administration at  this time. EF normal. Also reviewed Dr. Debby BudNorins note.  Donato SchultzSKAINS, Daren Yeagle, MD

## 2013-02-27 NOTE — Progress Notes (Addendum)
[facility] PROGRESS NOTE  Dannielle Karvonenlise Rowzee ZOX:[id number] DOB: 1917/01/11 DOA: 02/25/2013 PCP: Illene RegulusMichael Norins, MD  Assessment/Plan: 1-Acute Respiratory Failure: Secondary to PNA and Heart failure exacerbation.  Continue with IV antibiotics day 2. Change ceftriaxone to Zosyn for anaerobes coverage. Cover for aspiration. Continue with Azithromycin.  Strep Pneumonia positive.  Will defer IV lasix doses to cardiology.  Discussed with family, no BIPAP.  Now on NB Mask.   Nebulizer treatments PRN. Respiratory suction PRN, if ok with family and patient.  If patient decompensate further will need to discussed with family option of Morphine PRN for increase work of breathing.   Influenza negative.   2-History of CHF - last EF measured was in April 2013 was 55-60%.  Cardio consulted. BNP on admission at 10,383. Troponin times 3 negative. ECHO 1-21 Ef 55 %. Defer to cardio lasix doses. Mildly increase Cr.   3-Paroxysmal atrial fibrillation presently rate controlled - patient on aspirin and Cardizem.  4-Hyperglycemia - hemoglobin A1c6.7.  5-Pleuritic type of chest pain - probably from pneumonia. 6-Hyperthyroidism -  Continue methimazole. Check TSH. 7-Hypokalemia: resolved.    Code Status: DNR/DNI Family Communication: Care discussed with Granddaughter,  Disposition Plan: Remain inpatient. Cardiology consult to help with heart failure management. Palliative care consult for goals of care.    Consultants:  Cardiology  Palliative  Procedures:  none  Antibiotics:  Ceftriaxone 1-21---122  Azithromycin 1-21--  Zosyn 1-22  HPI/Subjective: Patient wake up to answer questions. She appears comfortable.  Family at bedside. We discussed about BIPAP, and Morphine for increase work of breathing.  No BIPAP.  further discussion will need to take place regarding morphine when times come.    Objective: Filed Vitals:   02/27/13 0545  BP: 132/50  Pulse: 66  Temp: 98.6 F (37 C)  Resp:  18   No intake or output data in the 24 hours ending 02/27/13 0954 Filed Weights   02/26/13 0043  Weight: 68.357 kg (150 lb 11.2 oz)    Exam:   General:  Resting comfortable.   Cardiovascular: S 1, S 2 RRR  Respiratory: Bilateral ronchus, no wheezes.   Abdomen: BS present, soft, NT  Musculoskeletal: trace edema.   Data Reviewed: Basic Metabolic Panel:  Recent Labs Lab 02/25/13 2055 02/26/13 0845 02/27/13 0645  NA 138 139 141  K 3.3* 3.7 4.2  CL 96 99 102  CO2 24 25 25   GLUCOSE 160* 168* 157*  BUN 26* 21 27*  CREATININE 1.10 0.97 1.13*  CALCIUM 9.5 8.6 8.7  MG  --  1.8  --    Liver Function Tests:  Recent Labs Lab 02/25/13 2055 02/26/13 0845  AST 18 16  ALT 24 20  ALKPHOS 112 106  BILITOT 0.9 0.9  PROT 6.4 5.7*  ALBUMIN 2.7* 2.4*   No results found for this basename: LIPASE, AMYLASE,  in the last 168 hours No results found for this basename: AMMONIA,  in the last 168 hours CBC:  Recent Labs Lab 02/25/13 2055 02/26/13 0845 02/27/13 0645  WBC 16.0* 16.5* 20.1*  NEUTROABS 13.2* 14.3*  --   HGB 11.1* 10.7* 10.6*  HCT 33.4* 32.0* 31.3*  MCV 84.6 84.7 83.7  PLT 209 197 174   Cardiac Enzymes:  Recent Labs Lab 02/25/13 2055 02/26/13 0845 02/26/13 1715 02/26/13 2200  TROPONINI <0.30 <0.30 <0.30 <0.30   BNP (last 3 results)  Recent Labs  02/25/13 2055  PROBNP 10383.0*   CBG: No results found for this basename: GLUCAP,  in the last 168  hours  No results found for this or any previous visit (from the past 240 hour(s)).   Studies: Dg Chest 2 View  02/25/2013   CLINICAL DATA:  Weakness and shortness of breath. History of hypertension.  EXAM: CHEST  2 VIEW  COMPARISON:  Chest radiograph May 10, 2011  FINDINGS: New right middle lobe consolidation, with minimal patchy bibasilar airspace opacities. Blunting of the right costophrenic angle. No pneumothorax.  Stable cardiomegaly. Calcified aortic knob, mediastinal silhouette is otherwise  unremarkable. Increasing interstitial prominence. Remote right lateral rib fractures. Osteopenia. Multiple EKG lines overlie the patient and may obscure subtle underlying pathology.  IMPRESSION: Right middle lobe consolidation concerning for pneumonia, with bibasilar patchy airspace opacities suggesting atelectasis with right lung base pleural thickening. Recommend followup chest radiograph after treatment to verify improvement.  Cardiomegaly, interstitial prominence suggesting COPD or possible edema.   Electronically Signed   By: Awilda Metro   On: 02/25/2013 22:09    Scheduled Meds: . aspirin  325 mg Oral Daily  . azithromycin  500 mg Intravenous Q24H  . calcium-vitamin D  1 tablet Oral BID WC  . diltiazem  120 mg Oral Daily  . enoxaparin (LOVENOX) injection  30 mg Subcutaneous Q24H  . feeding supplement (ENSURE COMPLETE)  237 mL Oral BID  . feeding supplement (ENSURE)  1 Container Oral BID PC  . hydrocortisone-pramoxine   Rectal BID  . labetalol  200 mg Oral BID  . methimazole  5 mg Oral Daily  . multivitamin-lutein  1 capsule Oral Daily  . omega-3 acid ethyl esters  1 capsule Oral BID  . piperacillin-tazobactam (ZOSYN)  IV  3.375 g Intravenous Q8H  . sodium chloride  3 mL Intravenous Q12H  . sodium chloride  3 mL Intravenous Q12H   Continuous Infusions:   Principal Problem:   Community acquired pneumonia Active Problems:   HYPERTHYROIDISM   HYPERTENSION   ATRIAL FIBRILLATION, PAROXYSMAL   Acute on chronic diastolic congestive heart failure, NYHA class 4   Acute respiratory failure   DNR (do not resuscitate)    Time spent: 35 minutes.     Benjimin Hadden  [facility] Pager [phone]. If 7PM-7AM, please contact night-coverage at [email or web address], password [facility] 02/27/2013, 9:54 AM  LOS: 2 days    Doppler positive for DVT; start therapeutic Lovenox.

## 2013-02-27 NOTE — Progress Notes (Addendum)
ANTIBIOTIC CONSULT NOTE - INITIAL  Pharmacy Consult for Zosyn + Lovenox Indication: pneumonia + new DVT  No Known Allergies  Patient Measurements: Height: 5\' 6"  (167.6 cm) Weight: 150 lb 11.2 oz (68.357 kg) IBW/kg (Calculated) : 59.3 Adjusted Body Weight:    Vital Signs: Temp: 98.6 F (37 C) (01/22 0545) Temp src: Axillary (01/22 0545) BP: 132/50 mmHg (01/22 0545) Pulse Rate: 66 (01/22 0545) Intake/Output from previous day: 01/21 0701 - 01/22 0700 In: -  Out: 350 [Urine:350] Intake/Output from this shift:    Labs:  Recent Labs  02/25/13 2055 02/26/13 0845 02/27/13 0645  WBC 16.0* 16.5* 20.1*  HGB 11.1* 10.7* 10.6*  PLT 209 197 174  CREATININE 1.10 0.97 1.13*   Estimated Creatinine Clearance: 27.9 ml/min (by C-G formula based on Cr of 1.13). No results found for this basename: VANCOTROUGH, VANCOPEAK, VANCORANDOM, GENTTROUGH, GENTPEAK, GENTRANDOM, TOBRATROUGH, TOBRAPEAK, TOBRARND, AMIKACINPEAK, AMIKACINTROU, AMIKACIN,  in the last 72 hours   Microbiology: No results found for this or any previous visit (from the past 720 hour(s)).  Medical History: Past Medical History  Diagnosis Date  . Rapid heart rate     arrythmia  . HTN (hypertension)   . Familial tremor   . Allergic rhinitis, cause unspecified 01/25/2011  . Thyroid disease     Medications:  Prescriptions prior to admission  Medication Sig Dispense Refill  . aspirin 325 MG tablet Take 325 mg by mouth daily.       . Calcium Carbonate-Vitamin D (CALTRATE 600+D) 600-400 MG-UNIT per tablet Take 1 tablet by mouth 2 (two) times daily.       Marland Kitchen. diltiazem (DILACOR XR) 180 MG 24 hr capsule Take 180 mg by mouth daily.      Marland Kitchen. docusate sodium (COLACE) 100 MG capsule Take 1 capsule (100 mg total) by mouth as needed. Stool Softener  10 capsule  3  . furosemide (LASIX) 40 MG tablet Take 1 tablet (40 mg total) by mouth daily.  90 tablet  3  . labetalol (NORMODYNE) 200 MG tablet Take 200 mg by mouth 2 (two) times  daily.      . methimazole (TAPAZOLE) 5 MG tablet Take 5 mg by mouth daily.      . Multiple Vitamins-Minerals (EYE VITAMINS) CAPS Take 1 each by mouth daily.       . OMEGA 3 1000 MG CAPS Take 1 capsule by mouth 2 (two) times daily.        Assessment: 80 y/o F here with fatigue, CXR concerning for PNA.  Anticoagulation:  New DVT of R leg. Hgb 10.6, plts 174  Infectious Disease: Flu negative. PNA. +Strep urine antigen. Tmax 101.9. WBC up to 20.1. Add Zosyn to cover aspiration  Cardiovascular: HTN, Afib, dCHF,132/50, HR 66. EF 60-65%% Meds: ASA 325mg , Diltiazem, labetalol, Lovaza  Endocrinology: Hyperthyroid on Methimazole  Gastrointestinal / Nutrition: Proctofoam, OscalD, Ocuvite  Neurology: Tremor  Nephrology: Scr 1.13 stable. CrCl 27  Pulmonary  Hematology / Oncology  PTA Medication Issues: CBC relatively stable  Best Practices: LMWH   Plan:  1. D/c Rocephin 2. Continue Zithromax 3. Add Zosyn 3.375g IV q8hr. 4. D/c Lovenox 30mg /24h and start Lovenox  70mg  SQ once daily for (CrCl<30)   Omega Durante S. Merilynn Finlandobertson, PharmD, BCPS Clinical Staff Pharmacist Pager [phone]  Misty Stanleyobertson, Merrissa Giacobbe Stillinger 02/27/2013,9:57 AM

## 2013-02-28 DIAGNOSIS — I82409 Acute embolism and thrombosis of unspecified deep veins of unspecified lower extremity: Secondary | ICD-10-CM | POA: Diagnosis not present

## 2013-02-28 DIAGNOSIS — J9600 Acute respiratory failure, unspecified whether with hypoxia or hypercapnia: Secondary | ICD-10-CM | POA: Diagnosis not present

## 2013-02-28 DIAGNOSIS — I4891 Unspecified atrial fibrillation: Secondary | ICD-10-CM | POA: Diagnosis not present

## 2013-02-28 DIAGNOSIS — Z515 Encounter for palliative care: Secondary | ICD-10-CM | POA: Diagnosis not present

## 2013-02-28 DIAGNOSIS — J189 Pneumonia, unspecified organism: Secondary | ICD-10-CM | POA: Diagnosis not present

## 2013-02-28 DIAGNOSIS — R0902 Hypoxemia: Secondary | ICD-10-CM

## 2013-02-28 DIAGNOSIS — I509 Heart failure, unspecified: Secondary | ICD-10-CM | POA: Diagnosis not present

## 2013-02-28 DIAGNOSIS — I10 Essential (primary) hypertension: Secondary | ICD-10-CM | POA: Diagnosis not present

## 2013-02-28 DIAGNOSIS — Z66 Do not resuscitate: Secondary | ICD-10-CM | POA: Diagnosis not present

## 2013-02-28 LAB — URINE CULTURE

## 2013-02-28 LAB — BASIC METABOLIC PANEL
BUN: 31 mg/dL — ABNORMAL HIGH (ref 6–23)
CALCIUM: 8.7 mg/dL (ref 8.4–10.5)
CHLORIDE: 99 meq/L (ref 96–112)
CO2: 24 mEq/L (ref 19–32)
Creatinine, Ser: 1.04 mg/dL (ref 0.50–1.10)
GFR calc Af Amer: 51 mL/min — ABNORMAL LOW (ref >90)
GFR calc non Af Amer: 44 mL/min — ABNORMAL LOW (ref >90)
GLUCOSE: 156 mg/dL — AB (ref 70–99)
Potassium: 4 mEq/L (ref 3.7–5.3)
Sodium: 137 mEq/L (ref 137–147)

## 2013-02-28 LAB — CBC
HCT: 31 % — ABNORMAL LOW (ref 36.0–46.0)
Hemoglobin: 10.4 g/dL — ABNORMAL LOW (ref 12.0–15.0)
MCH: 28.1 pg (ref 26.0–34.0)
MCHC: 33.5 g/dL (ref 30.0–36.0)
MCV: 83.8 fL (ref 78.0–100.0)
Platelets: 192 10*3/uL (ref 150–400)
RBC: 3.7 MIL/uL — AB (ref 3.87–5.11)
RDW: 14.9 % (ref 11.5–15.5)
WBC: 20.9 10*3/uL — ABNORMAL HIGH (ref 4.0–10.5)

## 2013-02-28 MED ORDER — GUAIFENESIN 100 MG/5ML PO SYRP
200.0000 mg | ORAL_SOLUTION | ORAL | Status: DC
  Administered 2013-02-28 – 2013-03-01 (×5): 200 mg via ORAL
  Filled 2013-02-28 (×11): qty 10

## 2013-02-28 MED ORDER — GUAIFENESIN 100 MG/5ML PO SOLN
5.0000 mL | ORAL | Status: DC | PRN
  Administered 2013-02-28: 100 mg via ORAL
  Filled 2013-02-28: qty 5

## 2013-02-28 NOTE — Consult Note (Signed)
Patient FQ:[id number] Alioto      DOB: October 07, 1916      XYO:[id number]   Summary of Goals of care; full note to follow:  Met with three daughters- [phone]), Rodena Piety [phone] c and [phone] h) and son inlaw Bill  Patient was able to contribute with me but the family did not want to meet as a whole with her yet.  Adelle expressed that she has been very is but seems to be improving.  She is articulate and pleasant and states that her desire is to try to improve and get moving around.  She is not used to just sitting .  She has been living independently up until this time and her daughter states she made her write her bills out before she would leave for the hospital.  I met with her children and reviewed her case.  Today she seems to be on an upswing.  They would like to see what the next 24-48 hours brings and consider including her in goals as we see what direction her health is going.  Overall , they shared with me that Mlissa has told them she is "ready to go",  That she desires to maintain life with quality.  We will reassess in the next 24-48 hours and see how she does and start talking with her about what next steps should be as we see how she does.  Rodena Piety and Joycelyn Schmid are MPOA Lenni has made herself DNR.  Recommend:    1.  DNR 2.  Cough with thick mucous: add flutter valve and mucinex try to thin out secretions 3.  Generalized achiness: airmattress, prn tylenol   4. Respiratory distress: improved using less oxygen but still in trouble .  If symtpoms worsen please call so we can help with comfort.  Currently patient able to cough up and spit out mucous. Consider speech eval if aspiration a concern.  405-515 pm  Alekxander Isola L. Lovena Le, MD MBA The Palliative Medicine Team at [facility] Phone: [phone] Pager: [phone]

## 2013-02-28 NOTE — Progress Notes (Signed)
[facility] PROGRESS NOTE  Danielle Adams WUJ:[id number] DOB: [birth year] DOA: 02/25/2013 PCP: Danielle Regulus, MD  Assessment/Plan: 1-Acute Respiratory Failure: Secondary to PNA and Heart failure exacerbation.  Continue with IV antibiotics day 3. Change ceftriaxone to Zosyn for anaerobes coverage. Cover for aspiration. Continue with Azithromycin.  Strep Pneumonia positive.  Will defer IV lasix doses to cardiology.  Discussed with family, no BIPAP.  Now on NB mask. Nebulizer treatments PRN. Respiratory suction PRN, if ok with family and patient.  Influenza negative.  Patient more alert today, she denies chest pain. She is feeling better than yesterday.  Palliative care meeting today.  WBC stable at 20.  Treating for DVT, would hold on getting CT angio to avoid risk for renal failure. Discussed with family.    2-History of CHF - last EF measured was in April 2013 was 55-60%.  Cardio consulted. BNP on admission at 10,383. Troponin times 3 negative. ECHO 1-21 Ef 55 %. Defer to cardio lasix doses. Mildly increase Cr.  Negative 1.6 L 3-Paroxysmal atrial fibrillation presently rate controlled - patient on aspirin and Cardizem.  4-Hyperglycemia - hemoglobin A1c6.7.  5-Pleuritic type of chest pain - probably from pneumonia. 6-Hyperthyroidism -  Continue methimazole. Check TSH. 7-Hypokalemia: resolved.  8-E coli UTI; continue with Zosyn.  9-Right  popliteal vein and right posterial tibial vein: continue with Lovenox therapeutic dose.      Code Status: DNR/DNI Family Communication: Care discussed with Granddaughter,  Disposition Plan: Remain inpatient.    Consultants:  Cardiology  Palliative  Procedures:  none  Antibiotics:  Ceftriaxone 1-21---122  Azithromycin 1-21--  Zosyn 1-22  HPI/Subjective: Patient more alert today. Answering questions.   Objective: Filed Vitals:   02/28/13 0613  BP: 138/51  Pulse: 64  Temp: 99.8 F (37.7 C)  Resp: 18     Intake/Output Summary (Last 24 hours) at 02/28/13 0901 Last data filed at 02/27/13 2210  Gross per 24 hour  Intake      0 ml  Output    750 ml  Net   -750 ml   Filed Weights   02/26/13 0043  Weight: 68.357 kg (150 lb 11.2 oz)    Exam:   General:  Alert, awake.   Cardiovascular: S 1, S 2 RRR  Respiratory: Bilateral ronchus, no wheezes.   Abdomen: BS present, soft, NT  Musculoskeletal: trace edema.   Data Reviewed: Basic Metabolic Panel:  Recent Labs Lab 02/25/13 2055 02/26/13 0845 02/27/13 0645 02/28/13 0417  NA 138 139 141 137  K 3.3* 3.7 4.2 4.0  CL 96 99 102 99  CO2 24 25 25 24   GLUCOSE 160* 168* 157* 156*  BUN 26* 21 27* 31*  CREATININE 1.10 0.97 1.13* 1.04  CALCIUM 9.5 8.6 8.7 8.7  MG  --  1.8  --   --    Liver Function Tests:  Recent Labs Lab 02/25/13 2055 02/26/13 0845  AST 18 16  ALT 24 20  ALKPHOS 112 106  BILITOT 0.9 0.9  PROT 6.4 5.7*  ALBUMIN 2.7* 2.4*   No results found for this basename: LIPASE, AMYLASE,  in the last 168 hours No results found for this basename: AMMONIA,  in the last 168 hours CBC:  Recent Labs Lab 02/25/13 2055 02/26/13 0845 02/27/13 0645 02/28/13 0417  WBC 16.0* 16.5* 20.1* 20.9*  NEUTROABS 13.2* 14.3*  --   --   HGB 11.1* 10.7* 10.6* 10.4*  HCT 33.4* 32.0* 31.3* 31.0*  MCV 84.6 84.7 83.7 83.8  PLT 209 197 174  192   Cardiac Enzymes:  Recent Labs Lab 02/25/13 2055 02/26/13 0845 02/26/13 1715 02/26/13 2200  TROPONINI <0.30 <0.30 <0.30 <0.30   BNP (last 3 results)  Recent Labs  02/25/13 2055  PROBNP 10383.0*   CBG: No results found for this basename: GLUCAP,  in the last 168 hours  Recent Results (from the past 240 hour(s))  URINE CULTURE     Status: None   Collection Time    02/25/13 10:43 PM      Result Value Range Status   Specimen Description URINE, CLEAN CATCH   Final   Special Requests NONE   Final   Culture  Setup Time     Final   Value: 02/26/2013 10:41     Performed at  [facility]   Colony Count     Final   Value: >=100,000 COLONIES/ML     Performed at [facility]   Culture     Final   Value: ESCHERICHIA COLI     Performed at [facility]   Report Status 02/28/2013 FINAL   Final   Organism ID, Bacteria ESCHERICHIA COLI   Final     Studies: No results found.  Scheduled Meds: . aspirin  325 mg Oral Daily  . azithromycin  500 mg Intravenous Q24H  . calcium-vitamin D  1 tablet Oral BID WC  . diltiazem  120 mg Oral Daily  . enoxaparin (LOVENOX) injection  70 mg Subcutaneous Q24H  . feeding supplement (ENSURE COMPLETE)  237 mL Oral BID  . feeding supplement (ENSURE)  1 Container Oral BID PC  . hydrocortisone-pramoxine   Rectal BID  . labetalol  200 mg Oral BID  . methimazole  5 mg Oral Daily  . multivitamin-lutein  1 capsule Oral Daily  . omega-3 acid ethyl esters  1 capsule Oral BID  . piperacillin-tazobactam (ZOSYN)  IV  3.375 g Intravenous Q8H  . sodium chloride  3 mL Intravenous Q12H  . sodium chloride  3 mL Intravenous Q12H   Continuous Infusions:   Principal Problem:   Community acquired pneumonia Active Problems:   HYPERTHYROIDISM   HYPERTENSION   ATRIAL FIBRILLATION, PAROXYSMAL   Acute on chronic diastolic congestive heart failure, NYHA class 4   Acute respiratory failure   DNR (do not resuscitate)    Time spent: 35 minutes.     Danielle Adams  [facility] Pager [phone]. If 7PM-7AM, please contact night-coverage at [email or web address], password [email or web address] 02/28/2013, 9:01 AM  LOS: 3 days

## 2013-02-28 NOTE — Progress Notes (Signed)
Subjective:  Feels better, no dizziness. Bradycardia improved.   Objective:  Vital Signs in the last 24 hours: Temp:  [97.8 F (36.6 C)-99.8 F (37.7 C)] 99.8 F (37.7 C) (01/23 16100613) Pulse Rate:  [55-64] 64 (01/23 0613) Resp:  [18] 18 (01/23 0613) BP: (104-138)/(51-67) 138/51 mmHg (01/23 0613) SpO2:  [93 %-98 %] 96 % (01/23 0613) FiO2 (%):  [50 %] 50 % (01/23 0240)  Intake/Output from previous day: 01/22 0701 - 01/23 0700 In: -  Out: 750 [Urine:750]   Physical Exam: General: Elderly in no acute distress. Head:  Normocephalic and atraumatic. FM O2. Hard of hearing.  Lungs: Clear to auscultation and percussion. Heart: Irreg irreg, normal rate.   2/6 SEM RUSB, no rubs or gallops.  Abdomen: soft, non-tender, positive bowel sounds. Extremities: No clubbing or cyanosis. No edema. Neurologic: Alert and oriented x 3.    Lab Results:  Recent Labs  02/27/13 0645 02/28/13 0417  WBC 20.1* 20.9*  HGB 10.6* 10.4*  PLT 174 192    Recent Labs  02/27/13 0645 02/28/13 0417  NA 141 137  K 4.2 4.0  CL 102 99  CO2 25 24  GLUCOSE 157* 156*  BUN 27* 31*  CREATININE 1.13* 1.04    Recent Labs  02/26/13 1715 02/26/13 2200  TROPONINI <0.30 <0.30   Hepatic Function Panel  Recent Labs  02/26/13 0845  PROT 5.7*  ALBUMIN 2.4*  AST 16  ALT 20  ALKPHOS 106  BILITOT 0.9    Telemetry: AFIB persistent. HR at times low 50's. No pauses.  Personally viewed.   Cardiac Studies:  ECHO 02/26/13: - Left ventricle: The cavity size was normal. Wall thickness was increased in a pattern of mild LVH. Systolic function was normal. The estimated ejection fraction was in the range of 60% to 65%. Wall motion was normal; there were no regional wall motion abnormalities. - Aortic valve: There was mild stenosis. Mild regurgitation. Valve area: 1.06cm^2(VTI). Valve area: 0.98cm^2 (Vmax). - Mitral valve: Mild regurgitation. - Left atrium: The atrium was moderately dilated. - Right  atrium: The atrium was moderately dilated. - Tricuspid valve: Moderate regurgitation. - Pulmonary arteries: PA peak pressure: 42mm Hg (S). - Pericardium, extracardiac: A trivial pericardial effusion was identified posterior to the heart.  Marland Kitchen. aspirin  325 mg Oral Daily  . azithromycin  500 mg Intravenous Q24H  . calcium-vitamin D  1 tablet Oral BID WC  . diltiazem  120 mg Oral Daily  . enoxaparin (LOVENOX) injection  70 mg Subcutaneous Q24H  . feeding supplement (ENSURE COMPLETE)  237 mL Oral BID  . feeding supplement (ENSURE)  1 Container Oral BID PC  . hydrocortisone-pramoxine   Rectal BID  . labetalol  200 mg Oral BID  . methimazole  5 mg Oral Daily  . multivitamin-lutein  1 capsule Oral Daily  . omega-3 acid ethyl esters  1 capsule Oral BID  . piperacillin-tazobactam (ZOSYN)  IV  3.375 g Intravenous Q8H  . sodium chloride  3 mL Intravenous Q12H  . sodium chloride  3 mL Intravenous Q12H    Assessment/Plan:  Principal Problem:   Community acquired pneumonia Active Problems:   HYPERTHYROIDISM   HYPERTENSION   ATRIAL FIBRILLATION, PAROXYSMAL   Acute on chronic diastolic congestive heart failure, NYHA class 4   Acute respiratory failure   DNR (do not resuscitate)  80 year old with permanent AFIB, PNA, now DVT, with acute on chronic diastolic HF.   1) AFIB  - persistent. Rate controlled.  -  on 1/22 decreased diltiazem to CD 120 QD. Continue.   - Mild asymptomatic bradycardia at times on tele. No pauses.   - see anticoagulation issue below.   2) DVT  - right pop and right post tib.   - primary team would like to treat with full dose anticoagution. Possible PE with respiratory failure. Has indication for anticoagulation with AFIB as well.   - Discussed with Dr. Sunnie Nielsen. Difficult situation given her advanced age and risk of bleeding. I would not recommend DVT dosed apixiban (10 BID) given risk of bleed. Currently on full dose Lovenox. Careful monitoring given bleeding risk.  She is having palliative care consult today. If family and patient wish to proceed with anticoagulation, would recommend coumadin trying to keep INR 2-2.5. The patient and family may not wish to pursue anticoagulation however.   3) PNA - abx.   4) Acute on chronic diastolic HF  - BNP 10000  - EF 55%  - Will hold on lasix for now.     SKAINS, MARK 02/28/2013, 10:32 AM

## 2013-02-28 NOTE — Consult Note (Signed)
Patient WT:UUEKC Herford      DOB: 1916-08-10      MKL:[id number]     Consult Note from the Palliative Medicine Team at [facility] Requested by: Dr. Tyrell Antonio     PCP: Adella Hare, MD Reason for Consultation: Goals of care    Phone Number:[phone]  Assessment of patients Current state: Patient is a sweet 80 year old white female who is been independent in her living up until this time. She presented to the hospital with increasing shortness of breath over the last week. She has had a productive cough and pleuritic chest pain on the right side. She was found to have right lower lobe pneumonia and treated with Lasix because of edema. Patient required oxygen on nonrebreather because of severe hypoxemia. Her course was subsequently complicated by the finding of a deep venous thrombosis on the right leg. She placed on blood thinners. I met with the patient's family and peripherally with the patient as the family was concerned about talking with her at this time. Family understands that she is in a tenuous situation and desire to take things one day the time. The patient herself states that her desires are to get better if possible. She agrees to allow me to continue to follow with her to see how she's doing and talk about her goals of care. Please see my note as of the day of service  Goals of Care: 1.  Code Status: DO NOT RESUSCITATE   2. Scope of Treatment: 1. Pneumonia with respiratory failure continue aggressive antibiotic therapy. Add guaifenesin to try to thin out the secretions. And add a flutter valve to assist in mobilization of secretions. 2. 2 general achiness continue Tylenol when necessary and add air mattress overlay 3. 3. Respiratory failure if her symptoms continue to worsen please contact us immediately for assistance with symptom management and further goals of care   4. Disposition: To be determined patient is coming to [place] from home where she lives with her  daughter   70. Symptom Management:   1. Cough: Add guaifenesin and flutter valve 2. Body aches add Tylenol when necessary 3. Significant mucus production add flutter valve. 4. Atrial fibrillation paroxysmal continue aspirin and Cardizem 5. DVT continue heparin. Patient will need to decide if she desires long-term anticoagulation  4. Psychosocial: Patient has 3 daughters her granddaughter Pascarella is emergency room nurse.  5. Spiritual: Spiritual care services have been offered       Patient Documents Completed or Given: Document Given Completed  Advanced Directives Pkt    MOST    DNR    Gone from My Sight    Hard Choices      Brief HPI: Patient is 80 years old and has been living independently up until this time. She presented with increasing shortness of breath and cough times one week she was found to have a right lower lobe pneumonia. Course is been complicated by elevated BNP and worsening white count with respiratory failure we've been asked to assist with goals of care   ROS: Short of breath, weak, decreased appetite    PMH:  Past Medical History  Diagnosis Date  . Rapid heart rate     arrythmia  . HTN (hypertension)   . Familial tremor   . Allergic rhinitis, cause unspecified 01/25/2011  . Thyroid disease      PSH: Past Surgical History  Procedure Laterality Date  . Tonsillectomy    . Dilation and curettage of uterus    .  Bunionectomy    . Laparoscopic ovarian cystectomy  04/2009   I have reviewed the FH and SH and  If appropriate update it with new information. No Known Allergies Scheduled Meds: . aspirin  325 mg Oral Daily  . azithromycin  500 mg Intravenous Q24H  . calcium-vitamin D  1 tablet Oral BID WC  . diltiazem  120 mg Oral Daily  . enoxaparin (LOVENOX) injection  70 mg Subcutaneous Q24H  . feeding supplement (ENSURE COMPLETE)  237 mL Oral BID  . feeding supplement (ENSURE)  1 Container Oral BID PC  . guaifenesin  200 mg Oral Q4H  .  hydrocortisone-pramoxine   Rectal BID  . labetalol  200 mg Oral BID  . methimazole  5 mg Oral Daily  . multivitamin-lutein  1 capsule Oral Daily  . omega-3 acid ethyl esters  1 capsule Oral BID  . piperacillin-tazobactam (ZOSYN)  IV  3.375 g Intravenous Q8H  . sodium chloride  3 mL Intravenous Q12H  . sodium chloride  3 mL Intravenous Q12H   Continuous Infusions:  PRN Meds:.acetaminophen, acetaminophen, albuterol, guaiFENesin, ondansetron (ZOFRAN) IV, ondansetron    BP 123/48  Pulse 70  Temp(Src) 99.8 F (37.7 C) (Rectal)  Resp 18  Ht 5' 6" (1.676 m)  Wt 68.357 kg (150 lb 11.2 oz)  BMI 24.34 kg/m2  SpO2 96%   PPS: 40%   Intake/Output Summary (Last 24 hours) at 02/28/13 1854 Last data filed at 02/27/13 2210  Gross per 24 hour  Intake      0 ml  Output    750 ml  Net   -750 ml   LBM: 02/25/2013                     Physical Exam:  General: Mild distress secondary to increased work of breathing, cognitively intact HEENT:  Pupils equal round and reactive to light, extra ocular muscles are intact mucous membranes are moist neck is supple I don't appreciate any JVD Chest:  Chest with coarse wet rhonchi right greater than left persistent air entry CVS: Regular rate and rhythm positive S1 and S2 no S3-S4 Abdomen: Soft nontender nondistended positive bowel sounds Ext: Without edema Neuro: Awake alert oriented cranial nerves 2-12 are intact  Labs: CBC    Component Value Date/Time   WBC 20.9* 02/28/2013 0417   RBC 3.70* 02/28/2013 0417   HGB 10.4* 02/28/2013 0417   HCT 31.0* 02/28/2013 0417   PLT 192 02/28/2013 0417   MCV 83.8 02/28/2013 0417   MCH 28.1 02/28/2013 0417   MCHC 33.5 02/28/2013 0417   RDW 14.9 02/28/2013 0417   LYMPHSABS 1.0 02/26/2013 0845   MONOABS 1.2* 02/26/2013 0845   EOSABS 0.0 02/26/2013 0845   BASOSABS 0.0 02/26/2013 0845       CMP     Component Value Date/Time   NA 137 02/28/2013 0417   K 4.0 02/28/2013 0417   CL 99 02/28/2013 0417   CO2 24  02/28/2013 0417   GLUCOSE 156* 02/28/2013 0417   BUN 31* 02/28/2013 0417   CREATININE 1.04 02/28/2013 0417   CALCIUM 8.7 02/28/2013 0417   PROT 5.7* 02/26/2013 0845   ALBUMIN 2.4* 02/26/2013 0845   AST 16 02/26/2013 0845   ALT 20 02/26/2013 0845   ALKPHOS 106 02/26/2013 0845   BILITOT 0.9 02/26/2013 0845   GFRNONAA 44* 02/28/2013 0417   GFRAA 51* 02/28/2013 0417    Chest Xray Reviewed/Impressions:  Right middle lobe consolidation concerning for pneumonia, with  bibasilar patchy airspace opacities suggesting atelectasis with  right lung base pleural thickening. Recommend followup chest  radiograph after treatment to verify improvement.  Cardiomegaly, interstitial prominence suggesting COPD or possible  edema.     Time In Time Out Total Time Spent with Patient Total Overall Time  4:05 PM   5:15 PM   20 minute   70 minutes     Greater than 50%  of this time was spent counseling and coordinating care related to the above assessment and plan.   Melissa L. Lovena Le, MD MBA The Palliative Medicine Team at [facility] Phone: [phone] Pager: [phone]

## 2013-03-01 ENCOUNTER — Ambulatory Visit (HOSPITAL_COMMUNITY)
Admission: RE | Admit: 2013-03-01 | Discharge: 2013-03-01 | Disposition: A | Discharge status: Home / Self Care | Payer: Medicare Other | Source: Ambulatory Visit | Attending: Cardiology | Admitting: Cardiology

## 2013-03-01 DIAGNOSIS — Z515 Encounter for palliative care: Secondary | ICD-10-CM | POA: Diagnosis not present

## 2013-03-01 DIAGNOSIS — I509 Heart failure, unspecified: Secondary | ICD-10-CM | POA: Diagnosis not present

## 2013-03-01 DIAGNOSIS — J189 Pneumonia, unspecified organism: Secondary | ICD-10-CM | POA: Diagnosis not present

## 2013-03-01 DIAGNOSIS — I5033 Acute on chronic diastolic (congestive) heart failure: Secondary | ICD-10-CM | POA: Diagnosis not present

## 2013-03-01 DIAGNOSIS — I4891 Unspecified atrial fibrillation: Secondary | ICD-10-CM | POA: Diagnosis not present

## 2013-03-01 DIAGNOSIS — I82409 Acute embolism and thrombosis of unspecified deep veins of unspecified lower extremity: Secondary | ICD-10-CM | POA: Diagnosis not present

## 2013-03-01 DIAGNOSIS — J9600 Acute respiratory failure, unspecified whether with hypoxia or hypercapnia: Secondary | ICD-10-CM | POA: Diagnosis not present

## 2013-03-01 LAB — BASIC METABOLIC PANEL
BUN: 29 mg/dL — AB (ref 6–23)
CO2: 25 meq/L (ref 19–32)
CREATININE: 0.99 mg/dL (ref 0.50–1.10)
Calcium: 8.7 mg/dL (ref 8.4–10.5)
Chloride: 101 mEq/L (ref 96–112)
GFR calc Af Amer: 54 mL/min — ABNORMAL LOW (ref >90)
GFR calc non Af Amer: 47 mL/min — ABNORMAL LOW (ref >90)
GLUCOSE: 126 mg/dL — AB (ref 70–99)
Potassium: 3.8 mEq/L (ref 3.7–5.3)
Sodium: 142 mEq/L (ref 137–147)

## 2013-03-01 LAB — CBC
HEMATOCRIT: 29.5 % — AB (ref 36.0–46.0)
Hemoglobin: 10.2 g/dL — ABNORMAL LOW (ref 12.0–15.0)
MCH: 28.4 pg (ref 26.0–34.0)
MCHC: 34.6 g/dL (ref 30.0–36.0)
MCV: 82.2 fL (ref 78.0–100.0)
PLATELETS: 185 10*3/uL (ref 150–400)
RBC: 3.59 MIL/uL — AB (ref 3.87–5.11)
RDW: 15 % (ref 11.5–15.5)
WBC: 22.3 10*3/uL — ABNORMAL HIGH (ref 4.0–10.5)

## 2013-03-01 MED ORDER — OMEGA-3-ACID ETHYL ESTERS 1 G PO CAPS
1.0000 | ORAL_CAPSULE | Freq: Two times a day (BID) | ORAL | Status: DC
  Administered 2013-03-01 – 2013-03-05 (×6): 1 g via ORAL
  Filled 2013-03-01 (×13): qty 1

## 2013-03-01 MED ORDER — PIPERACILLIN-TAZOBACTAM 3.375 G IVPB
3.3750 g | Freq: Three times a day (TID) | INTRAVENOUS | Status: DC
  Administered 2013-03-01 – 2013-03-04 (×9): 3.375 g via INTRAVENOUS
  Filled 2013-03-01 (×10): qty 50

## 2013-03-01 MED ORDER — ENSURE COMPLETE PO LIQD
237.0000 mL | Freq: Two times a day (BID) | ORAL | Status: DC
  Administered 2013-03-02: 237 mL via ORAL

## 2013-03-01 MED ORDER — OCUVITE-LUTEIN PO CAPS
1.0000 | ORAL_CAPSULE | Freq: Every day | ORAL | Status: DC
Start: 2013-03-02 — End: 2013-03-07
  Administered 2013-03-03 – 2013-03-07 (×5): 1 via ORAL
  Filled 2013-03-01 (×6): qty 1

## 2013-03-01 MED ORDER — DOCUSATE SODIUM 100 MG PO CAPS
100.0000 mg | ORAL_CAPSULE | ORAL | Status: DC | PRN
  Filled 2013-03-01: qty 1

## 2013-03-01 MED ORDER — VANCOMYCIN HCL IN DEXTROSE 1-5 GM/200ML-% IV SOLN
1000.0000 mg | INTRAVENOUS | Status: DC
  Administered 2013-03-01: 1000 mg via INTRAVENOUS
  Filled 2013-03-01: qty 200

## 2013-03-01 MED ORDER — GUAIFENESIN 100 MG/5ML PO SOLN
5.0000 mL | ORAL | Status: DC | PRN
  Filled 2013-03-01: qty 5

## 2013-03-01 MED ORDER — ASPIRIN 325 MG PO TABS
325.0000 mg | ORAL_TABLET | Freq: Every day | ORAL | Status: DC
  Administered 2013-03-02 – 2013-03-07 (×6): 325 mg via ORAL
  Filled 2013-03-01 (×7): qty 1

## 2013-03-01 MED ORDER — ACETAMINOPHEN 650 MG RE SUPP: 650.0000 mg | Freq: Four times a day (QID) | RECTAL | Status: DC | PRN

## 2013-03-01 MED ORDER — ACETAMINOPHEN 325 MG PO TABS: 650.0000 mg | ORAL_TABLET | Freq: Four times a day (QID) | ORAL | Status: DC | PRN

## 2013-03-01 MED ORDER — HYDROCORTISONE ACE-PRAMOXINE 1-1 % RE FOAM: Freq: Two times a day (BID) | RECTAL | Status: DC | PRN

## 2013-03-01 MED ORDER — ALBUTEROL SULFATE (2.5 MG/3ML) 0.083% IN NEBU: 2.5000 mg | INHALATION_SOLUTION | Freq: Four times a day (QID) | RESPIRATORY_TRACT | Status: DC | PRN

## 2013-03-01 MED ORDER — ONDANSETRON HCL 4 MG PO TABS: 4.0000 mg | ORAL_TABLET | Freq: Four times a day (QID) | ORAL | Status: DC | PRN

## 2013-03-01 MED ORDER — SODIUM CHLORIDE 0.9 % IJ SOLN
3.0000 mL | Freq: Two times a day (BID) | INTRAMUSCULAR | Status: DC
  Administered 2013-03-01 – 2013-03-06 (×4): 3 mL via INTRAVENOUS

## 2013-03-01 MED ORDER — ENSURE PUDDING PO PUDG
1.0000 | Freq: Two times a day (BID) | ORAL | Status: DC
  Administered 2013-03-02 – 2013-03-03 (×2): 1 via ORAL

## 2013-03-01 MED ORDER — ONDANSETRON HCL 4 MG/2ML IJ SOLN: 4.0000 mg | Freq: Four times a day (QID) | INTRAMUSCULAR | Status: DC | PRN

## 2013-03-01 MED ORDER — ENOXAPARIN SODIUM 80 MG/0.8ML ~~LOC~~ SOLN
70.0000 mg | SUBCUTANEOUS | Status: DC
  Administered 2013-03-02 – 2013-03-03 (×2): 70 mg via SUBCUTANEOUS
  Filled 2013-03-01 (×3): qty 0.8

## 2013-03-01 MED ORDER — METHIMAZOLE 5 MG PO TABS
5.0000 mg | ORAL_TABLET | Freq: Every day | ORAL | Status: DC
  Administered 2013-03-02 – 2013-03-07 (×6): 5 mg via ORAL
  Filled 2013-03-01 (×6): qty 1

## 2013-03-01 MED ORDER — DILTIAZEM HCL ER COATED BEADS 120 MG PO CP24
120.0000 mg | ORAL_CAPSULE | Freq: Every day | ORAL | Status: DC
  Administered 2013-03-02 – 2013-03-07 (×6): 120 mg via ORAL
  Filled 2013-03-01 (×6): qty 1

## 2013-03-01 MED ORDER — GUAIFENESIN 100 MG/5ML PO SYRP
200.0000 mg | ORAL_SOLUTION | ORAL | Status: DC
  Administered 2013-03-01: 100 mg via ORAL
  Administered 2013-03-01 – 2013-03-07 (×17): 200 mg via ORAL
  Filled 2013-03-01 (×41): qty 10

## 2013-03-01 MED ORDER — LABETALOL HCL 200 MG PO TABS
200.0000 mg | ORAL_TABLET | Freq: Two times a day (BID) | ORAL | Status: DC
Start: 2013-03-01 — End: 2013-03-07
  Administered 2013-03-01 – 2013-03-07 (×12): 200 mg via ORAL
  Filled 2013-03-01 (×13): qty 1

## 2013-03-01 MED ORDER — DEXTROSE 5 % IV SOLN
500.0000 mg | INTRAVENOUS | Status: DC
  Administered 2013-03-01 – 2013-03-03 (×2): 500 mg via INTRAVENOUS
  Filled 2013-03-01 (×3): qty 500

## 2013-03-01 MED ORDER — CALCIUM CARBONATE-VITAMIN D 500-200 MG-UNIT PO TABS
1.0000 | ORAL_TABLET | Freq: Two times a day (BID) | ORAL | Status: DC
  Administered 2013-03-03 – 2013-03-07 (×7): 1 via ORAL
  Filled 2013-03-01 (×15): qty 1

## 2013-03-01 MED ORDER — VANCOMYCIN HCL IN DEXTROSE 1-5 GM/200ML-% IV SOLN
1000.0000 mg | INTRAVENOUS | Status: DC
  Administered 2013-03-02 – 2013-03-04 (×3): 1000 mg via INTRAVENOUS
  Filled 2013-03-01 (×3): qty 200

## 2013-03-01 NOTE — Progress Notes (Signed)
[facility] PROGRESS NOTE  Danielle Adams ZOX:[id number] DOB: 1916-04-23 DOA: 02/25/2013 PCP: Illene Regulus, MD  Assessment/Plan: 1-Acute Respiratory Failure: Secondary to PNA and Heart failure exacerbation.  Continue with IV antibiotics day 4. Change ceftriaxone to Zosyn for anaerobes coverage. Cover for aspiration. Continue with Azithromycin. Will add vancomycin 1-24 due to WBC increasing.  Strep Pneumonia positive.  Will defer IV lasix doses to cardiology.  Discussed with family, no BIPAP.  Now on 6 L NS.  Nebulizer treatments PRN. Respiratory suction PRN, if ok with family and patient.  Influenza negative.  Patient more alert today, she denies chest pain. She is feeling better than yesterday.  WBC  Increase to 22, speech evaluation.  Treating for DVT, would hold on getting CT angio to avoid risk for renal failure. Discussed with family.   Repeat Chest x ray.   2-History of CHF - last EF measured was in April 2013 was 55-60%.  Cardio consulted. BNP on admission at 10,383. Troponin times 3 negative. ECHO 1-21 Ef 55 %. Defer to cardio lasix doses. Mildly increase Cr.  Negative 1.6 L  3-Paroxysmal atrial fibrillation presently rate controlled - Cardizem. holad aspirin patient on lovenox.  4-Hyperglycemia - hemoglobin A1c6.7.  5-Pleuritic type of chest pain - probably from pneumonia. 6-Hyperthyroidism -  Continue methimazole. Check TSH. 7-Hypokalemia: resolved.  8-E coli UTI; continue with Zosyn.  9-Right  popliteal vein and right posterial tibial vein: continue with Lovenox therapeutic dose. Patient will discuss with family about coumadin. Explain reifk of bleed with Lovenox.      Code Status: DNR/DNI Family Communication: Care discussed with Granddaughter,  Disposition Plan: Remain inpatient.    Consultants:  Cardiology  Palliative  Procedures:  none  Antibiotics:  Ceftriaxone 1-21---122  Azithromycin 1-21--  Zosyn 1-22  HPI/Subjective: Alert, breathing  better. Cough has improved.   Objective: Filed Vitals:   03/01/13 0500  BP: 128/48  Pulse: 65  Temp: 100 F (37.8 C)  Resp: 20    Intake/Output Summary (Last 24 hours) at 03/01/13 0919 Last data filed at 03/01/13 0500  Gross per 24 hour  Intake      0 ml  Output    800 ml  Net   -800 ml   Filed Weights   02/26/13 0043  Weight: 68.357 kg (150 lb 11.2 oz)    Exam:   General:  Alert, awake.   Cardiovascular: S 1, S 2 RRR  Respiratory: Bilateral ronchus, no wheezes.   Abdomen: BS present, soft, NT  Musculoskeletal: trace edema.   Data Reviewed: Basic Metabolic Panel:  Recent Labs Lab 02/25/13 2055 02/26/13 0845 02/27/13 0645 02/28/13 0417 03/01/13 0524  NA 138 139 141 137 142  K 3.3* 3.7 4.2 4.0 3.8  CL 96 99 102 99 101  CO2 24 25 25 24 25   GLUCOSE 160* 168* 157* 156* 126*  BUN 26* 21 27* 31* 29*  CREATININE 1.10 0.97 1.13* 1.04 0.99  CALCIUM 9.5 8.6 8.7 8.7 8.7  MG  --  1.8  --   --   --    Liver Function Tests:  Recent Labs Lab 02/25/13 2055 02/26/13 0845  AST 18 16  ALT 24 20  ALKPHOS 112 106  BILITOT 0.9 0.9  PROT 6.4 5.7*  ALBUMIN 2.7* 2.4*   No results found for this basename: LIPASE, AMYLASE,  in the last 168 hours No results found for this basename: AMMONIA,  in the last 168 hours CBC:  Recent Labs Lab 02/25/13 2055 02/26/13 0845 02/27/13 0645  02/28/13 0417 03/01/13 0524  WBC 16.0* 16.5* 20.1* 20.9* 22.3*  NEUTROABS 13.2* 14.3*  --   --   --   HGB 11.1* 10.7* 10.6* 10.4* 10.2*  HCT 33.4* 32.0* 31.3* 31.0* 29.5*  MCV 84.6 84.7 83.7 83.8 82.2  PLT 209 197 174 192 185   Cardiac Enzymes:  Recent Labs Lab 02/25/13 2055 02/26/13 0845 02/26/13 1715 02/26/13 2200  TROPONINI <0.30 <0.30 <0.30 <0.30   BNP (last 3 results)  Recent Labs  02/25/13 2055  PROBNP 10383.0*   CBG: No results found for this basename: GLUCAP,  in the last 168 hours  Recent Results (from the past 240 hour(s))  URINE CULTURE     Status: None    Collection Time    02/25/13 10:43 PM      Result Value Range Status   Specimen Description URINE, CLEAN CATCH   Final   Special Requests NONE   Final   Culture  Setup Time     Final   Value: 02/26/2013 10:41     Performed at [facility]   Colony Count     Final   Value: >=100,000 COLONIES/ML     Performed at [facility]   Culture     Final   Value: ESCHERICHIA COLI     Performed at [facility]   Report Status 02/28/2013 FINAL   Final   Organism ID, Bacteria ESCHERICHIA COLI   Final     Studies: No results found.  Scheduled Meds: . azithromycin  500 mg Intravenous Q24H  . calcium-vitamin D  1 tablet Oral BID WC  . diltiazem  120 mg Oral Daily  . enoxaparin (LOVENOX) injection  70 mg Subcutaneous Q24H  . feeding supplement (ENSURE COMPLETE)  237 mL Oral BID  . feeding supplement (ENSURE)  1 Container Oral BID PC  . guaifenesin  200 mg Oral Q4H  . hydrocortisone-pramoxine   Rectal BID  . labetalol  200 mg Oral BID  . methimazole  5 mg Oral Daily  . multivitamin-lutein  1 capsule Oral Daily  . omega-3 acid ethyl esters  1 capsule Oral BID  . piperacillin-tazobactam (ZOSYN)  IV  3.375 g Intravenous Q8H  . sodium chloride  3 mL Intravenous Q12H  . sodium chloride  3 mL Intravenous Q12H   Continuous Infusions:   Principal Problem:   Community acquired pneumonia Active Problems:   HYPERTHYROIDISM   HYPERTENSION   ATRIAL FIBRILLATION, PAROXYSMAL   Acute on chronic diastolic congestive heart failure, NYHA class 4   Acute respiratory failure   DNR (do not resuscitate)   DVT (deep venous thrombosis)    Time spent: 25 minutes.     Danielle Adams  [facility] Pager [phone]. If 7PM-7AM, please contact night-coverage at [email or web address], password [email or web address] 03/01/2013, 9:19 AM  LOS: 4 days

## 2013-03-01 NOTE — Progress Notes (Signed)
SUBJECTIVE:  No complaints this am  OBJECTIVE:   Vitals:   Filed Vitals:   02/28/13 0613 02/28/13 1350 02/28/13 2100 03/01/13 0500  BP: 138/51 123/48 140/56 128/48  Pulse: 64 70 67 65  Temp: 99.8 F (37.7 C)  98.7 F (37.1 C) 100 F (37.8 C)  TempSrc: Rectal     Resp: 18  18 20   Height:      Weight:      SpO2: 96% 96% 96% 94%   I&O's:   Intake/Output Summary (Last 24 hours) at 03/01/13 0853 Last data filed at 03/01/13 0500  Gross per 24 hour  Intake      0 ml  Output    800 ml  Net   -800 ml   TELEMETRY: Reviewed telemetry pt in atrial fibrillation     PHYSICAL EXAM General: Well developed, well nourished, in no acute distress Head: Eyes PERRLA, No xanthomas.   Normal cephalic and atramatic  Lungs:   Crackles at left base otherwise clear Heart:   HRRR S1 S2 Pulses are 2+ & equal. Abdomen: Bowel sounds are positive, abdomen soft and non-tender without masses Extremities:   No clubbing, cyanosis or edema.  DP +1 Neuro: Alert and oriented X 3. Psych:  Good affect, responds appropriately   LABS: Basic Metabolic Panel:  Recent Labs  19/14/78 0417 03/01/13 0524  NA 137 142  K 4.0 3.8  CL 99 101  CO2 24 25  GLUCOSE 156* 126*  BUN 31* 29*  CREATININE 1.04 0.99  CALCIUM 8.7 8.7   Liver Function Tests: No results found for this basename: AST, ALT, ALKPHOS, BILITOT, PROT, ALBUMIN,  in the last 72 hours No results found for this basename: LIPASE, AMYLASE,  in the last 72 hours CBC:  Recent Labs  02/28/13 0417 03/01/13 0524  WBC 20.9* 22.3*  HGB 10.4* 10.2*  HCT 31.0* 29.5*  MCV 83.8 82.2  PLT 192 185   Cardiac Enzymes:  Recent Labs  02/26/13 1715 02/26/13 2200  TROPONINI <0.30 <0.30   BNP: No components found with this basename: POCBNP,  D-Dimer: No results found for this basename: DDIMER,  in the last 72 hours Hemoglobin A1C: No results found for this basename: HGBA1C,  in the last 72 hours Fasting Lipid Panel: No results found for this  basename: CHOL, HDL, LDLCALC, TRIG, CHOLHDL, LDLDIRECT,  in the last 72 hours Thyroid Function Tests: No results found for this basename: TSH, T4TOTAL, FREET3, T3FREE, THYROIDAB,  in the last 72 hours Anemia Panel: No results found for this basename: VITAMINB12, FOLATE, FERRITIN, TIBC, IRON, RETICCTPCT,  in the last 72 hours Coag Panel:   No results found for this basename: INR, PROTIME    RADIOLOGY: Dg Chest 2 View  02/25/2013   CLINICAL DATA:  Weakness and shortness of breath. History of hypertension.  EXAM: CHEST  2 VIEW  COMPARISON:  Chest radiograph May 10, 2011  FINDINGS: New right middle lobe consolidation, with minimal patchy bibasilar airspace opacities. Blunting of the right costophrenic angle. No pneumothorax.  Stable cardiomegaly. Calcified aortic knob, mediastinal silhouette is otherwise unremarkable. Increasing interstitial prominence. Remote right lateral rib fractures. Osteopenia. Multiple EKG lines overlie the patient and may obscure subtle underlying pathology.  IMPRESSION: Right middle lobe consolidation concerning for pneumonia, with bibasilar patchy airspace opacities suggesting atelectasis with right lung base pleural thickening. Recommend followup chest radiograph after treatment to verify improvement.  Cardiomegaly, interstitial prominence suggesting COPD or possible edema.   Electronically Signed   By: Awilda Metro  On: 02/25/2013 22:09   Assessment/Plan:  Principal Problem:  Community acquired pneumonia  Active Problems:  HYPERTHYROIDISM  HYPERTENSION  ATRIAL FIBRILLATION, PAROXYSMAL  Acute on chronic diastolic congestive heart failure, NYHA class 4  Acute respiratory failure  DNR (do not resuscitate)   80 year old with permanent AFIB, PNA, now DVT, with acute on chronic diastolic HF.  1) AFIB  - persistent. Rate controlled.  - Continue diltiazem to CD 120 QD.   - see anticoagulation issue below.  2) DVT  - right pop and right post tib.  - primary team  would like to treat with full dose anticoagution. Possible PE with respiratory failure. Has indication for anticoagulation with AFIB as well.  - Difficult situation given her advanced age and risk of bleeding. Would not recommend DVT dosed apixiban (10 BID) given risk of bleed. Currently on full dose Lovenox. Careful monitoring given bleeding risk.  If family and patient wish to proceed with anticoagulation, would recommend coumadin trying to keep INR 2-2.5. The patient and family may not wish to pursue anticoagulation however. I discussed the issue today with patient and she want to wait and talk with her family. 3) PNA - abx.  4) Acute on chronic diastolic HF  - BNP 10000  - EF 55%  - Will hold on lasix for now. Will check a chest xray today since I hear crackles on the left and infiltrate was on the right     Quintella ReichertURNER,Tenna Lacko R, MD  03/01/2013  8:53 AM

## 2013-03-01 NOTE — Progress Notes (Signed)
ANTIBIOTIC CONSULT NOTE - INITIAL  Pharmacy Consult for Zosyn, vancomycin + Lovenox Indication: pneumonia + new DVT  No Known Allergies  Patient Measurements: Height: 5\' 6"  (167.6 cm) Weight: 150 lb 11.2 oz (68.357 kg) IBW/kg (Calculated) : 59.3  Vital Signs: Temp: 100 F (37.8 C) (01/24 0500) BP: 128/48 mmHg (01/24 0500) Pulse Rate: 65 (01/24 0500) Intake/Output from previous day: 01/23 0701 - 01/24 0700 In: -  Out: 800 [Urine:800] Intake/Output from this shift:    Labs:  Recent Labs  02/27/13 0645 02/28/13 0417 03/01/13 0524  WBC 20.1* 20.9* 22.3*  HGB 10.6* 10.4* 10.2*  PLT 174 192 185  CREATININE 1.13* 1.04 0.99   Estimated Creatinine Clearance: 31.8 ml/min (by C-G formula based on Cr of 0.99). No results found for this basename: VANCOTROUGH, Leodis BinetVANCOPEAK, VANCORANDOM, GENTTROUGH, GENTPEAK, GENTRANDOM, TOBRATROUGH, TOBRAPEAK, TOBRARND, AMIKACINPEAK, AMIKACINTROU, AMIKACIN,  in the last 72 hours   Microbiology: Recent Results (from the past 720 hour(s))  URINE CULTURE     Status: None   Collection Time    02/25/13 10:43 PM      Result Value Range Status   Specimen Description URINE, CLEAN CATCH   Final   Special Requests NONE   Final   Culture  Setup Time     Final   Value: 02/26/2013 10:41     Performed at [facility]   Colony Count     Final   Value: >=100,000 COLONIES/ML     Performed at [facility]   Culture     Final   Value: ESCHERICHIA COLI     Performed at [facility]   Report Status 02/28/2013 FINAL   Final   Organism ID, Bacteria ESCHERICHIA COLI   Final     Assessment: 80 y/o F here with fatigue, CXR concerning for PNA.  Anticoagulation: New DVT of R leg. ?PE- holding off on CT angio as kidneys are borderline and family does not want aggressive care- already treating with Lovenox  Infectious Disease: Flu negative. PNA. +Strep urine antigen. Tmax 100. WBC up to 22.3. Added Zosyn to cover aspiration and vancomycin  added today empirically  Azithro 1/22>> Zosyn 1/22>> Vancomycin 1/24>>  1/22 urine>>pan sensitive ECOLI  Cardiovascular: HTN, Afib, dCHF,123/48 hr 65. EF 60-65% Meds: ASA 325mg , Diltiazem, labetalol, Lovaza  Endocrinology: Hyperthyroid on Methimazole- TSH 0.9 this admit  Gastrointestinal / Nutrition: Proctofoam, OscalD, Ocuvite  Neurology: Tremor  Nephrology: Scr 0.99, CrCl ~6930mL/min  Pulmonary: 5L [place]  Hematology / Oncology:Hgb 10.2, plts 185- stable, no bleeding noted   PTA Medication Issues: addressed Best Practices: LMWH  Plan:  -Continue Zithromax 500mg  IV q24h -Zosyn 3.375g IV q8hr -Vancomycin 1g IV q24 -Lovenox 70mg  SQ once daily  Celedonio MiyamotoJeremy Tanveer Brammer, PharmD, BCPS Clinical Pharmacist Pager [phone]

## 2013-03-01 NOTE — Evaluation (Signed)
Clinical/Bedside Swallow Evaluation Patient Details  Name: Dannielle Karvonenlise Parkison MRN: [id number] Date of Birth: 1916-07-18  Today's Date: 03/01/2013 Time: 1020-1100 SLP Time Calculation (min): 40 min  Past Medical History:  Past Medical History  Diagnosis Date  . Rapid heart rate     arrythmia  . HTN (hypertension)   . Familial tremor   . Allergic rhinitis, cause unspecified 01/25/2011  . Thyroid disease    Past Surgical History:  Past Surgical History  Procedure Laterality Date  . Tonsillectomy    . Dilation and curettage of uterus    . Bunionectomy    . Laparoscopic ovarian cystectomy  04/2009   HPI:  80 [facility]- diagnosed with pna and CHF.  CXR showed right middle lobe consolidation concern for pna and suggestion of COPD or edema.  Pt PMH + for HTN, Afib, CHF and hyperthyroidism.  Pt is s/p palliative meeting with DNR established and continued medical tx.  Family goal is for pt to get well to discharge home with family.     Assessment / Plan / Recommendation Clinical Impression  Pt presents without focal CN deficits impacting swallowing musculature.  She admits to coughing x 2 weeks but family and pt deny worsening with intake.  Pt does have problems swallowing large pills - for which SLP educated her to alternative means of swallowing.  SLP observed pt consume 4 ounces water, single graham cracker and icecream - with timely swallow.  Baseline productive cough observed and with 20% of swallows - not worse with liquids than icecream. SLP can not rule out aspiration at bedside.  Note pt had a Right lober lobe pna in 05/2011.    Recommend continue regular/thin diet with general aspiration precautions to mitigate aspiration risk given pt's respiratory issues.  Xerostomia also reported for which SLP provided compensations.     Unless MD desires MBS to rule out OVERT aspiration, SLP will sign off.  Pt indicated she did not desire further testing but  expressed appreciation for information.     Aspiration Risk  Moderate (due to respiratory issue)    Diet Recommendation Regular   Liquid Administration via: Cup;Straw (pt prefers straw due to tremor) Medication Administration:  (defer to pt for administration ) Supervision: Patient able to self feed Compensations: Slow rate;Small sips/bites (rest breaks if short of breathe or coughing) Postural Changes and/or Swallow Maneuvers: Seated upright 90 degrees;Upright 30-60 min after meal    Other  Recommendations   none  Follow Up Recommendations  None    Frequency and Duration   n/a      Pertinent Vitals/Pain Afebrile, decreased     Swallow Study Prior Functional Status   Pt admits to minimal weight loss prior to admission due to poor intake while ill.  She states she can eat 3 meals/day without deficits.     General Date of Onset: 03/01/13 HPI: 80 [facility]- diagnosed with pna and CHF.  CXR showed right middle lobe consolidation concern for pna and suggestion of COPD or edema.  Pt PMH + for HTN, Afib, CHF and hyperthyroidism.  Pt is s/p palliative meeting with DNR established and continued medical tx.  Family goal is for pt to get well to discharge home with family.   Type of Study: Bedside swallow evaluation Diet Prior to this Study: Regular Temperature Spikes Noted: No Respiratory Status: Nasal cannula History of Recent Intubation: No Behavior/Cognition: Alert;Cooperative;Pleasant mood Oral Cavity - Dentition: Adequate  natural dentition Self-Feeding Abilities: Able to feed self Patient Positioning: Upright in bed Baseline Vocal Quality: Clear Volitional Cough: Strong Volitional Swallow: Able to elicit    Oral/Motor/Sensory Function Overall Oral Motor/Sensory Function: Appears within functional limits for tasks assessed (pt was leaning slightly to right, no focal cn deficits impacting swallow musculature)   Ice Chips Ice chips: Not tested    Thin Liquid Other Comments: cough with and without intake, cough with intake approx 20%of opportunities, swallow was timely with clear voice throughout    Nectar Thick Nectar Thick Liquid: Not tested   Honey Thick Honey Thick Liquid: Not tested   Puree Puree: Not tested   Solid   GO    Solid: Within functional limits Presentation: [address]       Mickie Bail Butler, MS [facility] SLP [phone]

## 2013-03-01 NOTE — Progress Notes (Signed)
Patient ZO:XWRUE:Danielle Adams      DOB: Dec 23, 1916      AVW:[id number]   [facility]    Subjective: Patient somewhat symptomatically improved. States cough medicine worked well. Appetite slightly improved. She was able to sleep during the night. Unfortunately her white count and chest x-ray did not look improved. Vancomycin was added. Family was updated at the bedside. Of note patient has indicated to family that she may not be willing to do a long-term fight if this process does not improve soon. At this time she is agreeable to the change in antibiotics and taking of the day at a time    Filed Vitals:   03/01/13 0500  BP: 128/48  Pulse: 65  Temp: 100 F (37.8 C)  Resp: 20   Physical exam:  Gen.: Improved distress. Cough is less choking, work of breathing is improved she is on nasal cannula now Pupils equal round reactive to light extra muscles are intact his membranes are dry Chest bilateral coarse wet rhonchi at the bases right greater than left Cardiovascular regular rate rhythm positive S1 and S2 no S3-S4 to appreciate a murmur per gallop Abdomen is soft nontender nondistended with positive bowel sounds no hepatosplenomegaly no guarding or rebound Extremities warm no mottling some areas of ecchymosis Neuro completely intact with full capacity for decision making   Sodium 142 potassium 3.8 chloride 101 CO2 25 BUN 29 creatinine 0.99 White cell count 22.3 hemoglobin 10.2    Increased confluent infiltrate in right lung base, mainly involving  the right middle lobe. Increased small bilateral pleural effusions   Assessment and plan: Patient is a 80 year old white female admitted to the hospital with increasing cough and shortness of breath x1 week. Patient was found to have a right middle lobe pneumonia. Course is complicated by the finding of a DVT. Patient is symptomatically improved but has had an increase in her white count and an  increase in confluence of her chest x-ray. Vancomycin has been added. Patient has expressed incompetence to her granddaughter that she might be tired of fighting but at this time agrees with the general care that she is receiving. We've given her permission to be honest about what her goals would be. She is somewhat compliant with care and not speaking her mind to practitioners but has made her wishes known to her family in fact it started planning some of her funeral arrangements. We'll continue to work with her to help her express her goals of care but at this time she is okay with continuing antibiotic therapy.   1. DO NOT RESUSCITATE  2. Continue aggressive treatment for her pneumonia with antibiotics, oxygen which she is requiring less of today and flutter valve 3. Cough continue guaifenesin 4. Mattress overlay has in order to help in controlling her body aches. Tylenol can be used when necessary  Total time 5 PM 2 5:45 PM  Danielle Mihalko L. Ladona Ridgelaylor, MD MBA The Palliative [facility] Team [facility] Team Phone: [phone] Pager: [phone]

## 2013-03-02 DIAGNOSIS — J9600 Acute respiratory failure, unspecified whether with hypoxia or hypercapnia: Secondary | ICD-10-CM | POA: Diagnosis not present

## 2013-03-02 DIAGNOSIS — I5033 Acute on chronic diastolic (congestive) heart failure: Secondary | ICD-10-CM | POA: Diagnosis not present

## 2013-03-02 DIAGNOSIS — I4891 Unspecified atrial fibrillation: Secondary | ICD-10-CM | POA: Diagnosis not present

## 2013-03-02 DIAGNOSIS — J189 Pneumonia, unspecified organism: Secondary | ICD-10-CM | POA: Diagnosis not present

## 2013-03-02 DIAGNOSIS — Z515 Encounter for palliative care: Secondary | ICD-10-CM | POA: Diagnosis not present

## 2013-03-02 DIAGNOSIS — I509 Heart failure, unspecified: Secondary | ICD-10-CM | POA: Diagnosis not present

## 2013-03-02 LAB — CBC
HEMATOCRIT: 29.3 % — AB (ref 36.0–46.0)
HEMOGLOBIN: 9.8 g/dL — AB (ref 12.0–15.0)
MCH: 27.8 pg (ref 26.0–34.0)
MCHC: 33.4 g/dL (ref 30.0–36.0)
MCV: 83 fL (ref 78.0–100.0)
Platelets: 223 10*3/uL (ref 150–400)
RBC: 3.53 MIL/uL — AB (ref 3.87–5.11)
RDW: 15.3 % (ref 11.5–15.5)
WBC: 20 10*3/uL — AB (ref 4.0–10.5)

## 2013-03-02 LAB — BASIC METABOLIC PANEL
BUN: 26 mg/dL — AB (ref 6–23)
CALCIUM: 8.4 mg/dL (ref 8.4–10.5)
CHLORIDE: 99 meq/L (ref 96–112)
CO2: 25 meq/L (ref 19–32)
Creatinine, Ser: 0.93 mg/dL (ref 0.50–1.10)
GFR, EST AFRICAN AMERICAN: 59 mL/min — AB (ref >90)
GFR, EST NON AFRICAN AMERICAN: 51 mL/min — AB (ref >90)
Glucose, Bld: 123 mg/dL — ABNORMAL HIGH (ref 70–99)
Potassium: 3.4 mEq/L — ABNORMAL LOW (ref 3.7–5.3)
SODIUM: 139 meq/L (ref 137–147)

## 2013-03-02 MED ORDER — FUROSEMIDE 10 MG/ML IJ SOLN
20.0000 mg | Freq: Once | INTRAMUSCULAR | Status: AC
  Administered 2013-03-02: 20 mg via INTRAVENOUS
  Filled 2013-03-02: qty 2

## 2013-03-02 MED ORDER — POTASSIUM CHLORIDE CRYS ER 20 MEQ PO TBCR
40.0000 meq | EXTENDED_RELEASE_TABLET | Freq: Two times a day (BID) | ORAL | Status: AC
  Administered 2013-03-02 (×2): 40 meq via ORAL
  Filled 2013-03-02 (×2): qty 2

## 2013-03-02 NOTE — Progress Notes (Signed)
Flutter valve done this afternoon,encouraged cough and deep breathe

## 2013-03-02 NOTE — Progress Notes (Addendum)
SUBJECTIVE:  Still has significant cough  OBJECTIVE:   Vitals:   Filed Vitals:   03/01/13 0924 03/01/13 1350 03/01/13 2100 03/02/13 0500  BP:  123/70 121/57 140/62  Pulse:  61 71 63  Temp:  99.2 F (37.3 C) 98.3 F (36.8 C) 98.4 F (36.9 C)  TempSrc:  Oral    Resp:  16 20 20   Height:      Weight:      SpO2: 92% 94% 92% 95%   I&O's:   Intake/Output Summary (Last 24 hours) at 03/02/13 0932 Last data filed at 03/02/13 0500  Gross per 24 hour  Intake    540 ml  Output   1150 ml  Net   -610 ml   TELEMETRY: Reviewed telemetry pt in atrial fibrillation     PHYSICAL EXAM General: Well developed, well nourished, in no acute distress Head: Eyes PERRLA, No xanthomas.   Normal cephalic and atramatic  Lungs:   Crackles at right base Heart:   Irregularly irregular S1 S2 Pulses are 2+ & equal. Abdomen: Bowel sounds are positive, abdomen soft and non-tender without masses Extremities:   No clubbing, cyanosis or edema.  DP +1 Neuro: Alert and oriented X 3. Psych:  Good affect, responds appropriately   LABS: Basic Metabolic Panel:  Recent Labs  16/11/9599/24/15 0524 03/02/13 0430  NA 142 139  K 3.8 3.4*  CL 101 99  CO2 25 25  GLUCOSE 126* 123*  BUN 29* 26*  CREATININE 0.99 0.93  CALCIUM 8.7 8.4   Liver Function Tests: No results found for this basename: AST, ALT, ALKPHOS, BILITOT, PROT, ALBUMIN,  in the last 72 hours No results found for this basename: LIPASE, AMYLASE,  in the last 72 hours CBC:  Recent Labs  03/01/13 0524 03/02/13 0430  WBC 22.3* 20.0*  HGB 10.2* 9.8*  HCT 29.5* 29.3*  MCV 82.2 83.0  PLT 185 223   Cardiac Enzymes: No results found for this basename: CKTOTAL, CKMB, CKMBINDEX, TROPONINI,  in the last 72 hours BNP: No components found with this basename: POCBNP,  D-Dimer: No results found for this basename: DDIMER,  in the last 72 hours Hemoglobin A1C: No results found for this basename: HGBA1C,  in the last 72 hours Fasting Lipid Panel: No  results found for this basename: CHOL, HDL, LDLCALC, TRIG, CHOLHDL, LDLDIRECT,  in the last 72 hours Thyroid Function Tests: No results found for this basename: TSH, T4TOTAL, FREET3, T3FREE, THYROIDAB,  in the last 72 hours Anemia Panel: No results found for this basename: VITAMINB12, FOLATE, FERRITIN, TIBC, IRON, RETICCTPCT,  in the last 72 hours Coag Panel:   No results found for this basename: INR, PROTIME    RADIOLOGY: Dg Chest 2 View  03/01/2013   CLINICAL DATA:  Followup pneumonia.  EXAM: CHEST  2 VIEW  COMPARISON:  02/25/13  FINDINGS: Increased confluent opacity is seen in the right lung base, mainly involving the right middle lobe on the lateral projection. Small pleural effusions are noted bilaterally, also mildly enlarged. Cardiomegaly remains stable.  IMPRESSION: Increased confluent infiltrate in right lung base, mainly involving the right middle lobe. Increased small bilateral pleural effusions.  Stable cardiomegaly.   Electronically Signed   By: Myles RosenthalJohn  Stahl M.D.   On: 03/01/2013 17:16   Dg Chest 2 View  02/25/2013   CLINICAL DATA:  Weakness and shortness of breath. History of hypertension.  EXAM: CHEST  2 VIEW  COMPARISON:  Chest radiograph May 10, 2011  FINDINGS: New right middle lobe consolidation,  with minimal patchy bibasilar airspace opacities. Blunting of the right costophrenic angle. No pneumothorax.  Stable cardiomegaly. Calcified aortic knob, mediastinal silhouette is otherwise unremarkable. Increasing interstitial prominence. Remote right lateral rib fractures. Osteopenia. Multiple EKG lines overlie the patient and may obscure subtle underlying pathology.  IMPRESSION: Right middle lobe consolidation concerning for pneumonia, with bibasilar patchy airspace opacities suggesting atelectasis with right lung base pleural thickening. Recommend followup chest radiograph after treatment to verify improvement.  Cardiomegaly, interstitial prominence suggesting COPD or possible edema.    Electronically Signed   By: Awilda Metro   On: 02/25/2013 22:09   Assessment/Plan:  Principal Problem:  Community acquired pneumonia  Active Problems:  HYPERTHYROIDISM  HYPERTENSION  ATRIAL FIBRILLATION, PAROXYSMAL  Acute on chronic diastolic congestive heart failure, NYHA class 4  Acute respiratory failure  DNR (do not resuscitate)  80 year old with permanent AFIB, PNA, now DVT, with acute on chronic diastolic HF.  1) AFIB  - persistent. Rate controlled.  - Continue diltiazem to CD 120 QD.  - see anticoagulation issue below.  2) DVT  - right pop and right post tib.  - primary team would like to treat with full dose anticoagution. Possible PE with respiratory failure. Has indication for anticoagulation with AFIB as well.  - Difficult situation given her advanced age and risk of bleeding. Would not recommend DVT dosed apixiban (10 BID) given risk of bleed. Currently on full dose Lovenox.Patient states that she does not want to be on longterm anticoagulation 3) PNA - abx.  4) Acute on chronic diastolic HF  - BNP 10000  - EF 55%  -Chest xray with increasing infiltrate on right and increased size of small pleural effusions.  Given increased O2 demands and elevated BNP will give Lasix 20mg  IV today 5) Hypokalemia - replete       Quintella Reichert, MD  03/02/2013  9:32 AM

## 2013-03-02 NOTE — Progress Notes (Signed)
Patient ZO:XWRUE:Pria Matthew FolksBerkley      DOB: 06/10/1916      AVW:[id number]   [facility] Note    Subjective: Patient continuing to improve. She is awake alert oriented cough is less she is sleeping better and eating better family is at the bedside and were updated. Patient is agreeable to taking Coumadin per notes.  Filed Vitals:   03/02/13 1407  BP: 107/70  Pulse: 63  Temp: 98.3 F (36.8 C)  Resp: 18   Physical exam:   Gen. awake alert oriented less distress Pupils are equal round and reactive to light Chest is with improved air entry basilar crackles right greater than left Cardiovascular regular rate rhythm positive S1 and S2 no S3-S4 Abdomen soft nontender nondistended with positive bowel sounds Extremities nontender no edema Neuro awake alert oriented cranial nerves 2-12 are intact  White count 20,000 hemoglobin 9.8 hematocrit 29.3 and platelets of 223 sodium 139 potassium 3.4 chloride 99 CO2 25 BUN 26 crit 0.93  Assessment and plan: 80 year old white female with right lower lobe pneumonia. White count remains elevated but she was just started on vancomycin. She clinically has improved we'll continue to follow she desires to continue curative care for now but we have given her permission to talk about comfort care if at any time she begins to decline with worsening symptoms    1. DO NOT RESUSCITATE 2. Community-acquired pneumonia with worsening white count to continue vancomycin and other broad spectrum antibiotics in addition to aggressive pulmonary toilet with flutter valve and guaifenesin  3. Poor appetite continue to encourage nutrition Air mattress overlay order to assist in overall body ache and some skin breakdown  Prognosis remains guarded   Total time 15 minutes   Robena Ewy L. Ladona Ridgelaylor, MD MBA [facility] Palliative Medicine Team [facility] Team Phone: [phone] Pager: [phone]

## 2013-03-02 NOTE — Progress Notes (Signed)
[facility] PROGRESS NOTE  Danielle Adams ZOX:[id number] DOB: 1916-08-04 DOA: 02/25/2013 PCP: Illene Regulus, MD  Assessment/Plan: 1-Acute Respiratory Failure: Secondary to PNA and Heart failure exacerbation.  Continue with IV antibiotics day 5. Change ceftriaxone to Zosyn for anaerobes coverage. Cover for aspiration. Continue with Azithromycin. Vancomycin started  1-24.  Strep Pneumonia positive.  Discussed with family, no BIPAP.  Now on 6 L NS.  Nebulizer treatments PRN. Influenza negative.  Patient more alert today, she denies chest pain. She is feeling better than yesterday.  Chest x ray 1-24: Increased confluent infiltrate in right lung base, mainly involving the right middle lobe. Increased small bilateral pleural effusions. This probably reflect early increase oxygen requirement during this hospitalization.  WBC at 20. If continue to increase tomorrow will consider CT chest.   2-History of CHF - last EF measured was in April 2013 was 55-60%.  Cardio consulted. BNP on admission at 10,383. Troponin times 3 negative. ECHO 1-21 Ef 55 %. Defer to cardio lasix doses.  Negative 2.7 L.   3-Paroxysmal atrial fibrillation presently rate controlled - Cardizem. holad aspirin patient on lovenox.  4-Hyperglycemia - hemoglobin A1c6.7.  5-Pleuritic type of chest pain - probably from pneumonia. Resolved.  6-Hyperthyroidism -  Continue methimazole. Check TSH. 7-Hypokalemia: resolved.  8-E coli UTI; continue with Zosyn.  9-Right  popliteal vein and right posterial tibial vein: continue with Lovenox therapeutic dose.  Explain reifk of bleed with Lovenox. Patient does not want to take coumadin. She is ok with Lovenox for now.   Code Status: DNR/DNI Family Communication: Care discussed with Granddaughter.  Disposition Plan: Remain inpatient.    Consultants:  Cardiology  Palliative  Procedures:  none  Antibiotics:  Ceftriaxone 1-21---122  Azithromycin 1-21--  Zosyn  1-22  HPI/Subjective: Breathing ok, still coughing a lot.  Objective: Filed Vitals:   03/02/13 1100  BP: 149/63  Pulse: 76  Temp:   Resp:     Intake/Output Summary (Last 24 hours) at 03/02/13 1332 Last data filed at 03/02/13 0500  Gross per 24 hour  Intake    300 ml  Output   1150 ml  Net   -850 ml   Filed Weights   02/26/13 0043  Weight: 68.357 kg (150 lb 11.2 oz)    Exam:   General:  Alert, awake.   Cardiovascular: S 1, S 2 RRR  Respiratory: Bilateral ronchus, no wheezes.   Abdomen: BS present, soft, NT  Musculoskeletal: trace edema.   Data Reviewed: Basic Metabolic Panel:  Recent Labs Lab 02/25/13 2055 02/26/13 0845 02/27/13 0645 02/28/13 0417 03/01/13 0524 03/02/13 0430  NA 138 139 141 137 142 139  K 3.3* 3.7 4.2 4.0 3.8 3.4*  CL 96 99 102 99 101 99  CO2 24 25 25 24 25 25   GLUCOSE 160* 168* 157* 156* 126* 123*  BUN 26* 21 27* 31* 29* 26*  CREATININE 1.10 0.97 1.13* 1.04 0.99 0.93  CALCIUM 9.5 8.6 8.7 8.7 8.7 8.4  MG  --  1.8  --   --   --   --    Liver Function Tests:  Recent Labs Lab 02/25/13 2055 02/26/13 0845  AST 18 16  ALT 24 20  ALKPHOS 112 106  BILITOT 0.9 0.9  PROT 6.4 5.7*  ALBUMIN 2.7* 2.4*   No results found for this basename: LIPASE, AMYLASE,  in the last 168 hours No results found for this basename: AMMONIA,  in the last 168 hours CBC:  Recent Labs Lab 02/25/13 2055 02/26/13 0845  02/27/13 0645 02/28/13 0417 03/01/13 0524 03/02/13 0430  WBC 16.0* 16.5* 20.1* 20.9* 22.3* 20.0*  NEUTROABS 13.2* 14.3*  --   --   --   --   HGB 11.1* 10.7* 10.6* 10.4* 10.2* 9.8*  HCT 33.4* 32.0* 31.3* 31.0* 29.5* 29.3*  MCV 84.6 84.7 83.7 83.8 82.2 83.0  PLT 209 197 174 192 185 223   Cardiac Enzymes:  Recent Labs Lab 02/25/13 2055 02/26/13 0845 02/26/13 1715 02/26/13 2200  TROPONINI <0.30 <0.30 <0.30 <0.30   BNP (last 3 results)  Recent Labs  02/25/13 2055  PROBNP 10383.0*   CBG: No results found for this  basename: GLUCAP,  in the last 168 hours  Recent Results (from the past 240 hour(s))  URINE CULTURE     Status: None   Collection Time    02/25/13 10:43 PM      Result Value Range Status   Specimen Description URINE, CLEAN CATCH   Final   Special Requests NONE   Final   Culture  Setup Time     Final   Value: 02/26/2013 10:41     [facility]formed at [facility]   Colony Count     Final   Value: >=100,000 COLONIES/ML     [facility]formed at [facility]   Culture     Final   Value: ESCHERICHIA COLI     Performed at [facility]   Report Status 02/28/2013 FINAL   Final   Organism ID, Bacteria ESCHERICHIA COLI   Final     Studies: Dg Chest 2 View  03/01/2013   CLINICAL DATA:  Followup pneumonia.  EXAM: CHEST  2 VIEW  COMPARISON:  02/25/13  FINDINGS: Increased confluent opacity is seen in the right lung base, mainly involving the right middle lobe on the lateral projection. Small pleural effusions are noted bilaterally, also mildly enlarged. Cardiomegaly remains stable.  IMPRESSION: Increased confluent infiltrate in right lung base, mainly involving the right middle lobe. Increased small bilateral pleural effusions.  Stable cardiomegaly.   Electronically Signed   By: Myles RosenthalJohn  Stahl M.D.   On: 03/01/2013 17:16    Scheduled Meds: . aspirin  325 mg Oral Daily  . azithromycin  500 mg Intravenous Q24H  . calcium-vitamin D  1 tablet Oral BID WC  . diltiazem  120 mg Oral Daily  . enoxaparin (LOVENOX) injection  70 mg Subcutaneous Q24H  . feeding supplement (ENSURE COMPLETE)  237 mL Oral BID  . feeding supplement (ENSURE)  1 Container Oral BID PC  . guaifenesin  200 mg Oral Q4H  . labetalol  200 mg Oral BID  . methimazole  5 mg Oral Daily  . multivitamin-lutein  1 capsule Oral Daily  . omega-3 acid ethyl esters  1 capsule Oral BID  . piperacillin-tazobactam (ZOSYN)  IV  3.375 g Intravenous Q8H  . potassium chloride  40 mEq Oral BID  . sodium chloride  3 mL Intravenous Q12H  .  vancomycin  1,000 mg Intravenous Q24H   Continuous Infusions:   Principal Problem:   Community acquired pneumonia Active Problems:   HYPERTHYROIDISM   HYPERTENSION   ATRIAL FIBRILLATION, PAROXYSMAL   Acute on chronic diastolic congestive heart failure, NYHA class 4   Acute respiratory failure   DNR (do not resuscitate)   DVT (deep venous thrombosis)    Time spent: 25 minutes.     Elspeth Blucher  [facility] Pager [phone]. If 7PM-7AM, please contact night-coverage at [email or web address], password [email or web address] 03/02/2013, 1:32 PM  LOS: 5  days

## 2013-03-02 NOTE — Progress Notes (Signed)
Pt reports she would like to start coumadin

## 2013-03-03 DIAGNOSIS — J9600 Acute respiratory failure, unspecified whether with hypoxia or hypercapnia: Secondary | ICD-10-CM | POA: Diagnosis not present

## 2013-03-03 DIAGNOSIS — I509 Heart failure, unspecified: Secondary | ICD-10-CM | POA: Diagnosis not present

## 2013-03-03 DIAGNOSIS — I5033 Acute on chronic diastolic (congestive) heart failure: Secondary | ICD-10-CM | POA: Diagnosis not present

## 2013-03-03 DIAGNOSIS — J189 Pneumonia, unspecified organism: Secondary | ICD-10-CM | POA: Diagnosis not present

## 2013-03-03 DIAGNOSIS — Z515 Encounter for palliative care: Secondary | ICD-10-CM | POA: Diagnosis not present

## 2013-03-03 DIAGNOSIS — I4891 Unspecified atrial fibrillation: Secondary | ICD-10-CM | POA: Diagnosis not present

## 2013-03-03 DIAGNOSIS — I82409 Acute embolism and thrombosis of unspecified deep veins of unspecified lower extremity: Secondary | ICD-10-CM | POA: Diagnosis not present

## 2013-03-03 LAB — PROTIME-INR
INR: 1.37 (ref 0.00–1.49)
PROTHROMBIN TIME: 16.5 s — AB (ref 11.6–15.2)

## 2013-03-03 LAB — BASIC METABOLIC PANEL
BUN: 23 mg/dL (ref 6–23)
CO2: 26 mEq/L (ref 19–32)
CREATININE: 0.94 mg/dL (ref 0.50–1.10)
Calcium: 8.7 mg/dL (ref 8.4–10.5)
Chloride: 102 mEq/L (ref 96–112)
GFR calc Af Amer: 58 mL/min — ABNORMAL LOW (ref >90)
GFR calc non Af Amer: 50 mL/min — ABNORMAL LOW (ref >90)
Glucose, Bld: 117 mg/dL — ABNORMAL HIGH (ref 70–99)
POTASSIUM: 4.8 meq/L (ref 3.7–5.3)
Sodium: 143 mEq/L (ref 137–147)

## 2013-03-03 LAB — CBC
HCT: 29.5 % — ABNORMAL LOW (ref 36.0–46.0)
Hemoglobin: 9.9 g/dL — ABNORMAL LOW (ref 12.0–15.0)
MCH: 28.3 pg (ref 26.0–34.0)
MCHC: 33.6 g/dL (ref 30.0–36.0)
MCV: 84.3 fL (ref 78.0–100.0)
PLATELETS: 272 10*3/uL (ref 150–400)
RBC: 3.5 MIL/uL — ABNORMAL LOW (ref 3.87–5.11)
RDW: 15.3 % (ref 11.5–15.5)
WBC: 20.4 10*3/uL — AB (ref 4.0–10.5)

## 2013-03-03 MED ORDER — WARFARIN SODIUM 2.5 MG PO TABS
2.5000 mg | ORAL_TABLET | Freq: Once | ORAL | Status: AC
  Administered 2013-03-03: 2.5 mg via ORAL
  Filled 2013-03-03: qty 1

## 2013-03-03 MED ORDER — WARFARIN - PHARMACIST DOSING INPATIENT: Freq: Every day | Status: DC

## 2013-03-03 MED ORDER — POLYETHYLENE GLYCOL 3350 17 G PO PACK
17.0000 g | PACK | Freq: Every day | ORAL | Status: DC
  Administered 2013-03-03 – 2013-03-07 (×2): 17 g via ORAL
  Filled 2013-03-03 (×5): qty 1

## 2013-03-03 NOTE — Progress Notes (Signed)
[facility] PROGRESS NOTE  Teia Freitas ZOX:[id number] DOB: 06/02/1916 DOA: 02/25/2013 PCP: Illene Regulus, MD  Assessment/Plan: 1-Acute Respiratory Failure: Secondary to PNA and Heart failure exacerbation.  Continue with IV antibiotics day 6. Change ceftriaxone to Zosyn for anaerobes coverage. Vancomycin started  1-24. Received 5 days of Azithromycin.  Strep Pneumonia positive.  Discussed with family, no BIPAP.  Now on 5 L NS.  Nebulizer treatments PRN. Influenza negative.  Chest x ray 1-24: Increased confluent infiltrate in right lung base, mainly involving the right middle lobe. Increased small bilateral pleural effusions. This probably reflect early increase oxygen requirement during this hospitalization.  WBC at 20. Discussed with ID. Continue with vancomycin and zosyn. Check CBC with differential, sputum culture.   2-History of CHF - last EF measured was in April 2013 was 55-60%.  Cardio consulted. BNP on admission at 10,383. Troponin times 3 negative. ECHO 1-21 Ef 55 %. Defer to cardio lasix doses.  Negative 2.7 L.    3-Paroxysmal atrial fibrillation presently rate controlled - Cardizem. holad aspirin patient on lovenox.  4-Hyperglycemia - hemoglobin A1c6.7.  5-Pleuritic type of chest pain - probably from pneumonia. Resolved.  6-Hyperthyroidism -  Continue methimazole. Check TSH. 7-Hypokalemia: resolved.  8-E coli UTI; continue with Zosyn.  9-Right  popliteal vein and right posterial tibial vein: continue with Lovenox therapeutic dose.  Explain reifk of bleed with Lovenox. Will start coumadin, patient agree. Day 1 bridge.    Code Status: DNR/DNI Family Communication: Care discussed with Granddaughter and daughter.  Disposition Plan: Remain inpatient. PT , OT consult.    Consultants:  Cardiology  Palliative  Procedures:  none  Antibiotics:  Ceftriaxone 1-21---122  Azithromycin 1-21--1-26  Zosyn 1-22---  HPI/Subjective: Breathing ok, still coughing a  lot. She agree to take coumadin.   Objective: Filed Vitals:   03/03/13 0500  BP: 122/58  Pulse: 107  Temp: 98.9 F (37.2 C)  Resp: 18    Intake/Output Summary (Last 24 hours) at 03/03/13 1004 Last data filed at 03/03/13 0500  Gross per 24 hour  Intake    720 ml  Output    800 ml  Net    -80 ml   Filed Weights   02/26/13 0043  Weight: 68.357 kg (150 lb 11.2 oz)    Exam:   General:  Alert, awake.   Cardiovascular: S 1, S 2 RRR  Respiratory: Bilateral ronchus, no wheezes.   Abdomen: BS present, soft, NT  Musculoskeletal: trace edema.   Data Reviewed: Basic Metabolic Panel:  Recent Labs Lab 02/25/13 2055 02/26/13 0845 02/27/13 0645 02/28/13 0417 03/01/13 0524 03/02/13 0430 03/03/13 0432  NA 138 139 141 137 142 139 143  K 3.3* 3.7 4.2 4.0 3.8 3.4* 4.8  CL 96 99 102 99 101 99 102  CO2 24 25 25 24 25 25 26   GLUCOSE 160* 168* 157* 156* 126* 123* 117*  BUN 26* 21 27* 31* 29* 26* 23  CREATININE 1.10 0.97 1.13* 1.04 0.99 0.93 0.94  CALCIUM 9.5 8.6 8.7 8.7 8.7 8.4 8.7  MG  --  1.8  --   --   --   --   --    Liver Function Tests:  Recent Labs Lab 02/25/13 2055 02/26/13 0845  AST 18 16  ALT 24 20  ALKPHOS 112 106  BILITOT 0.9 0.9  PROT 6.4 5.7*  ALBUMIN 2.7* 2.4*   No results found for this basename: LIPASE, AMYLASE,  in the last 168 hours No results found for this basename: AMMONIA,  in the last 168 hours CBC:  Recent Labs Lab 02/25/13 2055 02/26/13 0845 02/27/13 0645 02/28/13 0417 03/01/13 0524 03/02/13 0430 03/03/13 0432  WBC 16.0* 16.5* 20.1* 20.9* 22.3* 20.0* 20.4*  NEUTROABS 13.2* 14.3*  --   --   --   --   --   HGB 11.1* 10.7* 10.6* 10.4* 10.2* 9.8* 9.9*  HCT 33.4* 32.0* 31.3* 31.0* 29.5* 29.3* 29.5*  MCV 84.6 84.7 83.7 83.8 82.2 83.0 84.3  PLT 209 197 174 192 185 223 272   Cardiac Enzymes:  Recent Labs Lab 02/25/13 2055 02/26/13 0845 02/26/13 1715 02/26/13 2200  TROPONINI <0.30 <0.30 <0.30 <0.30   BNP (last 3  results)  Recent Labs  02/25/13 2055  PROBNP 10383.0*   CBG: No results found for this basename: GLUCAP,  in the last 168 hours  Recent Results (from the past 240 hour(s))  URINE CULTURE     Status: None   Collection Time    02/25/13 10:43 PM      Result Value Range Status   Specimen Description URINE, CLEAN CATCH   Final   Special Requests NONE   Final   Culture  Setup Time     Final   Value: 02/26/2013 10:41     Performed at [facility] Count     Final   Value: >=100,000 COLONIES/ML     Performed at [facility]   Culture     Final   Value: ESCHERICHIA COLI     Performed at [facility]   Report Status 02/28/2013 FINAL   Final   Organism ID, Bacteria ESCHERICHIA COLI   Final     Studies: Dg Chest 2 View  03/01/2013   CLINICAL DATA:  Followup pneumonia.  EXAM: CHEST  2 VIEW  COMPARISON:  02/25/13  FINDINGS: Increased confluent opacity is seen in the right lung base, mainly involving the right middle lobe on the lateral projection. Small pleural effusions are noted bilaterally, also mildly enlarged. Cardiomegaly remains stable.  IMPRESSION: Increased confluent infiltrate in right lung base, mainly involving the right middle lobe. Increased small bilateral pleural effusions.  Stable cardiomegaly.   Electronically Signed   By: Myles Rosenthal M.D.   On: 03/01/2013 17:16    Scheduled Meds: . aspirin  325 mg Oral Daily  . calcium-vitamin D  1 tablet Oral BID WC  . diltiazem  120 mg Oral Daily  . enoxaparin (LOVENOX) injection  70 mg Subcutaneous Q24H  . feeding supplement (ENSURE COMPLETE)  237 mL Oral BID  . feeding supplement (ENSURE)  1 Container Oral BID PC  . guaifenesin  200 mg Oral Q4H  . labetalol  200 mg Oral BID  . methimazole  5 mg Oral Daily  . multivitamin-lutein  1 capsule Oral Daily  . omega-3 acid ethyl esters  1 capsule Oral BID  . piperacillin-tazobactam (ZOSYN)  IV  3.375 g Intravenous Q8H  . polyethylene glycol  17 g Oral  Daily  . sodium chloride  3 mL Intravenous Q12H  . vancomycin  1,000 mg Intravenous Q24H   Continuous Infusions:   Principal Problem:   Community acquired pneumonia Active Problems:   HYPERTHYROIDISM   HYPERTENSION   ATRIAL FIBRILLATION, PAROXYSMAL   Acute on chronic diastolic congestive heart failure, NYHA class 4   Acute respiratory failure   DNR (do not resuscitate)   DVT (deep venous thrombosis)    Time spent: 25 minutes.     REGALADO,BELKYS  [facility] Pager  [phone]. If 7PM-7AM, please contact night-coverage at [email or web address], password [email or web address] 03/03/2013, 10:04 AM  LOS: 6 days

## 2013-03-03 NOTE — Progress Notes (Signed)
Subjective: Doing well.  Some upper body aches and pains when she moves.  Objective: Vital signs in last 24 hours: Temp:  [98.3 F (36.8 C)-99.2 F (37.3 C)] 98.9 F (37.2 C) (01/26 0500) Pulse Rate:  [63-107] 107 (01/26 0500) Resp:  [18] 18 (01/26 0500) BP: (107-149)/(58-70) 122/58 mmHg (01/26 0500) SpO2:  [95 %-96 %] 95 % (01/26 0500) Last BM Date: 02/25/13  Intake/Output from previous day: 01/25 0701 - 01/26 0700 In: 960 [P.O.:960] Out: 800 [Urine:800] Intake/Output this shift:    Medications Current Facility-Administered Medications  Medication Dose Route Frequency Provider Last Rate Last Dose  . acetaminophen (TYLENOL) tablet 650 mg  650 mg Oral Q6H PRN Belkys A Regalado, MD       Or  . acetaminophen (TYLENOL) suppository 650 mg  650 mg Rectal Q6H PRN Belkys A Regalado, MD      . albuterol (PROVENTIL) (2.5 MG/3ML) 0.083% nebulizer solution 2.5 mg  2.5 mg Nebulization Q6H PRN Belkys A Regalado, MD      . aspirin tablet 325 mg  325 mg Oral Daily Belkys A Regalado, MD   325 mg at 03/02/13 1100  . azithromycin (ZITHROMAX) 500 mg in dextrose 5 % 250 mL IVPB  500 mg Intravenous Q24H Belkys A Regalado, MD   500 mg at 03/03/13 0026  . calcium-vitamin D (OSCAL WITH D) 500-200 MG-UNIT per tablet 1 tablet  1 tablet Oral BID WC Belkys A Regalado, MD      . diltiazem (CARDIZEM CD) 24 hr capsule 120 mg  120 mg Oral Daily Belkys A Regalado, MD   120 mg at 03/02/13 1100  . docusate sodium (COLACE) capsule 100 mg  100 mg Oral PRN Belkys A Regalado, MD      . enoxaparin (LOVENOX) injection 70 mg  70 mg Subcutaneous Q24H Belkys A Regalado, MD   70 mg at 03/02/13 1135  . feeding supplement (ENSURE COMPLETE) (ENSURE COMPLETE) liquid 237 mL  237 mL Oral BID Belkys A Regalado, MD   237 mL at 03/02/13 1100  . feeding supplement (ENSURE) (ENSURE) pudding 1 Container  1 Container Oral BID PC Alba Cory, MD   1 Container at 03/02/13 1137  . guaiFENesin (ROBITUSSIN) 100 MG/5ML solution 100  mg  5 mL Oral Q4H PRN Belkys A Regalado, MD      . guaifenesin (ROBITUSSIN) 100 MG/5ML syrup 200 mg  200 mg Oral Q4H Belkys A Regalado, MD   200 mg at 03/02/13 1958  . hydrocortisone-pramoxine (PROCTOFOAM-HC) rectal foam   Rectal BID PRN Belkys A Regalado, MD      . labetalol (NORMODYNE) tablet 200 mg  200 mg Oral BID Belkys A Regalado, MD   200 mg at 03/02/13 2122  . methimazole (TAPAZOLE) tablet 5 mg  5 mg Oral Daily Belkys A Regalado, MD   5 mg at 03/02/13 1100  . multivitamin-lutein (OCUVITE-LUTEIN) capsule 1 capsule  1 capsule Oral Daily Belkys A Regalado, MD      . omega-3 acid ethyl esters (LOVAZA) capsule 1 g  1 capsule Oral BID Belkys A Regalado, MD   1 g at 03/02/13 2122  . ondansetron (ZOFRAN) tablet 4 mg  4 mg Oral Q6H PRN Belkys A Regalado, MD       Or  . ondansetron (ZOFRAN) injection 4 mg  4 mg Intravenous Q6H PRN Belkys A Regalado, MD      . piperacillin-tazobactam (ZOSYN) IVPB 3.375 g  3.375 g Intravenous Q8H Belkys A Regalado, MD  3.375 g at 03/03/13 0417  . sodium chloride 0.9 % injection 3 mL  3 mL Intravenous Q12H Belkys A Regalado, MD   3 mL at 03/01/13 2212  . vancomycin (VANCOCIN) IVPB 1000 mg/200 mL premix  1,000 mg Intravenous Q24H Belkys A Regalado, MD   1,000 mg at 03/02/13 1100    PE: General appearance: alert, cooperative and no distress Lungs: clear to auscultation bilaterally Heart: irregularly irregular rhythm and 2/6 sys MM LSB. Abdomen: soft, non-tender; bowel sounds normal; no masses,  no organomegaly Extremities: No LEE Pulses: 2+ and symmetric Skin: Warm and dry Neurologic: Grossly normal  Lab Results:   Recent Labs  03/01/13 0524 03/02/13 0430 03/03/13 0432  WBC 22.3* 20.0* 20.4*  HGB 10.2* 9.8* 9.9*  HCT 29.5* 29.3* 29.5*  PLT 185 223 272   BMET  Recent Labs  03/01/13 0524 03/02/13 0430 03/03/13 0432  NA 142 139 143  K 3.8 3.4* 4.8  CL 101 99 102  CO2 25 25 26   GLUCOSE 126* 123* 117*  BUN 29* 26* 23  CREATININE 0.99 0.93 0.94    CALCIUM 8.7 8.4 8.7     Assessment/Plan   Principal Problem:   Community acquired pneumonia Active Problems:   HYPERTHYROIDISM   HYPERTENSION   ATRIAL FIBRILLATION, PAROXYSMAL   Acute on chronic diastolic congestive heart failure, NYHA class 4   Acute respiratory failure   DNR (do not resuscitate)   DVT (deep venous thrombosis)  Plan:   Continues in Afib with rate sometimes in the 40's but otherwise controlled.  On Cardizem 120cd  DVT:  Right popliteal and PT.   Currently on Lovenox.  High risk of bleed.  Pt states she will start anticoagulation with coumadin.  She usually ambulates pretty well with a cane.  PNA: on abx  Acute on chronic diastolic HF.  EF 60-65%.  Net fluids: +0.2L/-2.6L.  K+ WNL.   Lasix stopped.  Follow daily weight. Volume is ok.   BP controlled.   LOS: 6 days    HAGER, BRYAN 03/03/2013 8:51 AM  I have personally seen and examined this patient with Wilburt FinlayBryan Hager, PA-C. I agree with the assessment and plan as outlined above. Rate controlled atrial fib. Continue Diltiazem. Reasonable to consider coumadin with DVT and atrial fib if pt and family agreeable. Given advanced age she is at a higher risk of bleeding on any agent. Volume status is ok today. Lasix on hold. Follow daily weights and strict I/O.   MCALHANY,CHRISTOPHER 12:48 PM 03/03/2013

## 2013-03-03 NOTE — Evaluation (Signed)
Physical Therapy Evaluation Patient Details Name: Danielle Adams MRN: [id number] DOB: 1916/11/15 Today's Date: 03/03/2013 Time: 4782-9562 PT Time Calculation (min): 34 min  PT Assessment / Plan / Recommendation History of Present Illness  80 y.o. female admitted to [facility] on 02/25/13 with history of hypertension, hypothyroidism, paroxysmal atrial fibrillation, CHF was brought to the ER after patient was having increasing shortness of breath over the last one week. Patient was also noticed to be having increasing productive cough. Patient also has pleuritic type of chest pain on the right side. In the ER patient was noted to have elevated white cell count with elevated BNP and chest x-ray features concerning for pneumonia.    Clinical Impression  A normally ambulatory pt ( with a cane at home) is now mod to max assist to mobilize OOB to chair.  She is very deconditioned physically with poor cardiopulmonary endurance right now.  She will likely need some extensive therapy before returning home with her daughter's 24/7 assist.   PT to follow acutely for deficits listed below.       PT Assessment  Patient needs continued PT services    Follow Up Recommendations  SNF    Does the patient have the potential to tolerate intense rehabilitation     NA  Barriers to Discharge   Right now she is not strong enough to manage at home even with her daughter's help.        Equipment Recommendations  Rolling walker with 5" wheels    Recommendations for Other Services   None  Frequency Min 2X/week    Precautions / Restrictions Precautions Precautions: Fall;Other (comment) Precaution Comments: Monitor O2 sats   Pertinent Vitals/Pain O2 sats 95-97%on 5-6 L O2 [place] throughout session.  DOE 2/4 with mobility.  HR 60s-70s throughout session.       Mobility  Bed Mobility Overal bed mobility: Needs Assistance Bed Mobility: Supine to Sit Supine to sit: Mod assist;HOB elevated General bed mobility comments:  mod assist to support trunk and weight shift hips to get to EOB on compliant mattress.  Verbal cues to use arms on bed rails to help pull.  Pt with mild posterior lean throughout transition.  Transfers Overall transfer level: Needs assistance Transfers: Sit to/from Stand;Stand Pivot Transfers Sit to Stand: Mod assist Stand pivot transfers: Max assist General transfer comment: mod assist to support trunk to get to standing from elevated bed.  Stood x 2 once with RW and once without RW for transfer to chair.  PT relying heavily on support of the bed to stabilize against for balance ans stability.  Very weak bil legs.          PT Diagnosis: Difficulty walking;Abnormality of gait;Generalized weakness  PT Problem List: Decreased strength;Decreased activity tolerance;Decreased balance;Decreased mobility;Decreased knowledge of use of DME;Cardiopulmonary status limiting activity PT Treatment Interventions: DME instruction;Gait training;Stair training;Functional mobility training;Therapeutic activities;Therapeutic exercise;Balance training;Neuromuscular re-education;Patient/family education     PT Goals(Current goals can be found in the care plan section) Acute Rehab PT Goals Patient Stated Goal: to get stronger and go home PT Goal Formulation: With patient/family Time For Goal Achievement: 03/17/13 Potential to Achieve Goals: Good  Visit Information  Last PT Received On: 03/03/13 Assistance Needed: +1 History of Present Illness: 80 y.o. female admitted to [facility] on 02/25/13 with history of hypertension, hypothyroidism, paroxysmal atrial fibrillation, CHF was brought to the ER after patient was having increasing shortness of breath over the last one week. Patient was also noticed to be having increasing productive  cough. Patient also has pleuritic type of chest pain on the right side. In the ER patient was noted to have elevated white cell count with elevated BNP and chest x-ray features concerning for  pneumonia.         Prior Functioning  Home Living Family/patient expects to be discharged to:: Private residence Living Arrangements: Children Available Help at Discharge: Family;Available 24 hours/day Type of Home: House Home Access: Stairs to enter [employer] CorporationEntrance Stairs-Number of Steps: 1 Home Layout: One level Home Equipment: Cane - single point Additional Comments: Until 3 [facility].   Prior Function Level of Independence: Independent with assistive device(s) Comments: uses cane when out and about, furniture walks and last few weeks has used the cane in the house.  No longer driving.   Communication Communication: HOH (hearing aids both) Dominant Hand: Right    Cognition  Cognition Arousal/Alertness: Awake/alert Behavior During Therapy: WFL for tasks assessed/performed Overall Cognitive Status: Within Functional Limits for tasks assessed    Extremity/Trunk Assessment Upper Extremity Assessment Upper Extremity Assessment: Defer to OT evaluation Lower Extremity Assessment Lower Extremity Assessment: Generalized weakness (equal weakness bil) Cervical / Trunk Assessment Cervical / Trunk Assessment: Normal   Balance Balance Overall balance assessment: Needs assistance Sitting-balance support: Bilateral upper extremity supported;Feet supported Sitting balance-Leahy Scale: Poor Sitting balance - Comments: needs min assit due to posterior lean to maintain balance in sitting.  Postural control: Posterior lean Standing balance support: Bilateral upper extremity supported Standing balance-Leahy Scale: Zero General Comments General comments (skin integrity, edema, etc.): Pt on 5-6 L O2 [place] throughout session DOE 2/4 and O2 sats in the mid to upper 90s HR 60s-70s during mobility.    End of Session PT - End of Session Equipment Utilized During Treatment: Gait belt;Oxygen Activity Tolerance: Patient limited by fatigue Patient left:  in chair;with call bell/phone within reach;with family/visitor present Nurse Communication: Mobility status;Other (comment) (need for two person assit for out of chair later)    Lurena Joinerebecca B. Imanie Darrow, PT, DPT [phone]   03/03/2013, 11:58 AM

## 2013-03-03 NOTE — Progress Notes (Signed)
ANTICOAGULATION CONSULT NOTE - Initial Consult  Pharmacy Consult for lovenox and coumadin Indication: new DVT, afib  No Known Allergies  Patient Measurements: Height: 5\' 6"  (167.6 cm) Weight: 150 lb 11.2 oz (68.357 kg) IBW/kg (Calculated) : 59.3   Vital Signs: Temp: 98.9 F (37.2 C) (01/26 0500) BP: 122/58 mmHg (01/26 0500) Pulse Rate: 107 (01/26 0500)  Labs:  Recent Labs  03/01/13 0524 03/02/13 0430 03/03/13 0432  HGB 10.2* 9.8* 9.9*  HCT 29.5* 29.3* 29.5*  PLT 185 223 272  CREATININE 0.99 0.93 0.94    Estimated Creatinine Clearance: 33.5 ml/min (by C-G formula based on Cr of 0.94).   Medical History: Past Medical History  Diagnosis Date  . Rapid heart rate     arrythmia  . HTN (hypertension)   . Familial tremor   . Allergic rhinitis, cause unspecified 01/25/2011  . Thyroid disease     Assessment: Patient is a 80 y.o F currently on lovenox for afib and new DVT.  Patient has agreed to start coumadin today.  No bleeding documented.    Goal of Therapy:  INR 2-3 Monitor platelets by anticoagulation protocol: Yes   Plan:  1) coumadin 2.5 mg PO x1 today 2) continue lovenox 70mg  SQ q24h  Odalys Win P 03/03/2013,10:52 AM

## 2013-03-03 NOTE — Progress Notes (Signed)
The Chaplain stopped by to visit with the patient for an initial visit but the patient was resting. A Chaplain from the unit will follow up with this visit in the near future.  Chaplain Kwan Harlynn Kimbell 

## 2013-03-03 NOTE — Progress Notes (Signed)
OT Cancellation Note  Patient Details Name: Danielle Adams MRN: [id number] DOB: [birth year]   Cancelled Treatment:    Reason Eval/Treat Not Completed: Fatigue/lethargy limiting ability to participate - pt sleeping.  Will try back   Reynolds AmericanWendi Kedrick Mcnamee, OTR/L [phone]  03/03/2013, 1:06 PM

## 2013-03-04 ENCOUNTER — Inpatient Hospital Stay (HOSPITAL_COMMUNITY): Payer: Medicare Other

## 2013-03-04 ENCOUNTER — Encounter (HOSPITAL_COMMUNITY): Payer: Self-pay | Admitting: Radiology

## 2013-03-04 DIAGNOSIS — J154 Pneumonia due to other streptococci: Secondary | ICD-10-CM

## 2013-03-04 DIAGNOSIS — J189 Pneumonia, unspecified organism: Secondary | ICD-10-CM | POA: Diagnosis not present

## 2013-03-04 DIAGNOSIS — R0902 Hypoxemia: Secondary | ICD-10-CM | POA: Diagnosis not present

## 2013-03-04 DIAGNOSIS — I82409 Acute embolism and thrombosis of unspecified deep veins of unspecified lower extremity: Secondary | ICD-10-CM | POA: Diagnosis not present

## 2013-03-04 DIAGNOSIS — R091 Pleurisy: Secondary | ICD-10-CM | POA: Diagnosis not present

## 2013-03-04 DIAGNOSIS — J9600 Acute respiratory failure, unspecified whether with hypoxia or hypercapnia: Secondary | ICD-10-CM | POA: Diagnosis not present

## 2013-03-04 DIAGNOSIS — J90 Pleural effusion, not elsewhere classified: Secondary | ICD-10-CM

## 2013-03-04 DIAGNOSIS — I4891 Unspecified atrial fibrillation: Secondary | ICD-10-CM | POA: Diagnosis not present

## 2013-03-04 DIAGNOSIS — I5033 Acute on chronic diastolic (congestive) heart failure: Secondary | ICD-10-CM | POA: Diagnosis not present

## 2013-03-04 LAB — PROTEIN, BODY FLUID: Total protein, fluid: 2.6 g/dL

## 2013-03-04 LAB — CBC WITH DIFFERENTIAL/PLATELET
BASOS ABS: 0 10*3/uL (ref 0.0–0.1)
Basophils Relative: 0 % (ref 0–1)
EOS ABS: 0.2 10*3/uL (ref 0.0–0.7)
Eosinophils Relative: 1 % (ref 0–5)
HCT: 31.7 % — ABNORMAL LOW (ref 36.0–46.0)
Hemoglobin: 10.4 g/dL — ABNORMAL LOW (ref 12.0–15.0)
LYMPHS ABS: 1.4 10*3/uL (ref 0.7–4.0)
Lymphocytes Relative: 7 % — ABNORMAL LOW (ref 12–46)
MCH: 27.8 pg (ref 26.0–34.0)
MCHC: 32.8 g/dL (ref 30.0–36.0)
MCV: 84.8 fL (ref 78.0–100.0)
Monocytes Absolute: 1.6 10*3/uL — ABNORMAL HIGH (ref 0.1–1.0)
Monocytes Relative: 8 % (ref 3–12)
NEUTROS PCT: 84 % — AB (ref 43–77)
Neutro Abs: 17 10*3/uL — ABNORMAL HIGH (ref 1.7–7.7)
PLATELETS: 362 10*3/uL (ref 150–400)
RBC: 3.74 MIL/uL — ABNORMAL LOW (ref 3.87–5.11)
RDW: 15.4 % (ref 11.5–15.5)
WBC: 20.1 10*3/uL — AB (ref 4.0–10.5)

## 2013-03-04 LAB — BODY FLUID CELL COUNT WITH DIFFERENTIAL
Lymphs, Fluid: 5 %
MONOCYTE-MACROPHAGE-SEROUS FLUID: 9 % — AB (ref 50–90)
NEUTROPHIL FLUID: 86 % — AB (ref 0–25)
WBC FLUID: 1633 uL — AB (ref 0–1000)

## 2013-03-04 LAB — BASIC METABOLIC PANEL
BUN: 21 mg/dL (ref 6–23)
CO2: 24 mEq/L (ref 19–32)
Calcium: 8.8 mg/dL (ref 8.4–10.5)
Chloride: 100 mEq/L (ref 96–112)
Creatinine, Ser: 0.89 mg/dL (ref 0.50–1.10)
GFR calc Af Amer: 62 mL/min — ABNORMAL LOW (ref >90)
GFR calc non Af Amer: 53 mL/min — ABNORMAL LOW (ref >90)
GLUCOSE: 106 mg/dL — AB (ref 70–99)
Potassium: 4.4 mEq/L (ref 3.7–5.3)
SODIUM: 140 meq/L (ref 137–147)

## 2013-03-04 LAB — LACTATE DEHYDROGENASE: LDH: 211 U/L (ref 94–250)

## 2013-03-04 LAB — LACTATE DEHYDROGENASE, PLEURAL OR PERITONEAL FLUID: LD, Fluid: 395 U/L — ABNORMAL HIGH (ref 3–23)

## 2013-03-04 LAB — PROTIME-INR
INR: 1.16 (ref 0.00–1.49)
PROTHROMBIN TIME: 14.6 s (ref 11.6–15.2)

## 2013-03-04 LAB — PROTEIN, TOTAL: TOTAL PROTEIN: 5.6 g/dL — AB (ref 6.0–8.3)

## 2013-03-04 LAB — CHOLESTEROL, TOTAL: Cholesterol: 119 mg/dL (ref 0–200)

## 2013-03-04 MED ORDER — ENOXAPARIN SODIUM 80 MG/0.8ML ~~LOC~~ SOLN
70.0000 mg | Freq: Once | SUBCUTANEOUS | Status: AC
  Administered 2013-03-04: 70 mg via SUBCUTANEOUS
  Filled 2013-03-04 (×2): qty 0.8

## 2013-03-04 MED ORDER — LIDOCAINE HCL (PF) 1 % IJ SOLN
5.0000 mL | Freq: Once | INTRAMUSCULAR | Status: AC
  Administered 2013-03-04: 5 mL via INTRADERMAL
  Filled 2013-03-04: qty 5

## 2013-03-04 MED ORDER — ENOXAPARIN SODIUM 80 MG/0.8ML ~~LOC~~ SOLN
70.0000 mg | Freq: Two times a day (BID) | SUBCUTANEOUS | Status: DC
  Filled 2013-03-04: qty 0.8

## 2013-03-04 MED ORDER — DEXTROSE 5 % IV SOLN
1.0000 g | INTRAVENOUS | Status: DC
  Administered 2013-03-04 – 2013-03-06 (×3): 1 g via INTRAVENOUS
  Filled 2013-03-04 (×4): qty 10

## 2013-03-04 MED ORDER — ENSURE COMPLETE PO LIQD: 237.0000 mL | ORAL | Status: DC | PRN

## 2013-03-04 MED ORDER — WARFARIN SODIUM 2.5 MG PO TABS
2.5000 mg | ORAL_TABLET | Freq: Once | ORAL | Status: DC
  Filled 2013-03-04: qty 1

## 2013-03-04 MED ORDER — ENSURE PUDDING PO PUDG: 1.0000 | ORAL | Status: DC | PRN

## 2013-03-04 MED ORDER — ENOXAPARIN SODIUM 80 MG/0.8ML ~~LOC~~ SOLN
70.0000 mg | Freq: Once | SUBCUTANEOUS | Status: AC
  Administered 2013-03-04: 70 mg via SUBCUTANEOUS
  Filled 2013-03-04: qty 0.8

## 2013-03-04 NOTE — Progress Notes (Signed)
Clinical Social Work Department BRIEF PSYCHOSOCIAL ASSESSMENT 03/04/2013  Patient:  Danielle Adams,Danielle Adams     Account Number:  [id number]     Admit date:  02/25/2013  Clinical Social Worker:  Harless NakayamaAMBELAL,Angle Dirusso, LCSWA  Date/Time:  03/04/2013 10:30 AM  Referred by:  Physician  Date Referred:  03/04/2013 Referred for  SNF Placement   Other Referral:   Interview type:  Patient Other interview type:   Spoke with pt and pt daughter and granddaughter who were at bedside    PSYCHOSOCIAL DATA Living Status:  WITH ADULT CHILDREN Admitted from facility:   Level of care:   Primary support name:  Danielle Adams [phone] Primary support relationship to patient:  CHILD, ADULT Degree of support available:   Pt  has very supportive family    CURRENT CONCERNS Current Concerns  Post-Acute Placement   Other Concerns:    SOCIAL WORK ASSESSMENT / PLAN CSW made aware of PT recommendation for SNF. CSW spoke with pt and pt family about recommendation. Pt stated that she would be willing to consider the option but would want to talk it over with her family first. CSW explained SNF referral process to pt and pt family. At this time, [facility] so she has bed offers available to consider. Pt family did have concerns about location of facility. CSW explained that family would be able to choose facility based on whichever preference they wanted.   Assessment/plan status:  Psychosocial Support/Ongoing Assessment of Needs Other assessment/ plan:   Information/referral to community resources:   SNF list to be provided with bed offers    PATIENT'S/FAMILY'S RESPONSE TO PLAN OF CARE: Pt is agreeable to considering SNF but is undecided on dc plan at this time.       Danielle Adams, LCSWA [phone]

## 2013-03-04 NOTE — Progress Notes (Signed)
Patient ZO:XWRUE:Danielle Adams      DOB: 02/02/1917      AVW:[id number]   [facility]all[facility] at [facility] Progress Note    Subjective:  Patient was just up with PT and is exhausted very week.  Sat up in chair for 2 hours now back to bed and can't keep eyes open.   Breathing comfortable she offers no complaints as she falls off to sleep.  Yesterday worked on her Ipad and seemed to be progressing. Physical exam:   General: exhausted ,no acute respiratory distress PERRL, EOMI, anicteric, mmm Chest decreased with improved air entry CVS: regular rate and rhythm S1, S2 Abd: soft, not tender, not distended Ext: warm, Neuro: sleepy right now   White cells 20.1, hemoglobin 9.9 platelets 272 INR 1.37      Assessment and plan: 80 year-old white female admitted with respiratory failure found to have right middle lobe pneumonia white count continued to climb despite antibiotics and therefore it antibiotics were broadened. She seemed to be improving but is very fatigued today. She will likely have a long recovery if she continues to improve will likely need rehabilitation prior to discharge to home  1 DO NOT RESUSCITATE. Patient at this time it is continuing to be cooperative and desire curative care. We have shared with her that if any time she continues to decline them once full comfort that we can talk about that.  2. Cough continue Robitussin and flutter valve  3 generalized weakness continue slow progression towards goals with PT  4. DVT continue Coumadin as patient has agreed to take this long-term  Prognosis still guarded patient continues to progress  Disposition will be up to the primary service when she is medically stable she will likely need SNIF placement if she continues to decline in the palliative course will likely be sought our team will  Total time 15 minutes To 45 to 1 PM discussed with patient's 2 daughters Arye Weyenberg L. Ladona Ridgelaylor, MD MBA The Palliative Medicine Team at  [facility] Team Phone: [phone] Pager: [phone]  continue to follow.

## 2013-03-04 NOTE — Procedures (Signed)
Thoracentesis Procedure Note  Pre-operative Diagnosis: Right sided pleural effusion  Post-operative Diagnosis: same  Indications: Right sided pleural effusion  Procedure Details  Consent: Informed consent was obtained. Risks of the procedure were discussed including: infection, bleeding, pain, pneumothorax.  Under sterile conditions the patient was positioned. Betadine solution and sterile drapes were utilized.  1% plain lidocaine was used to anesthetize the 6 rib space. Fluid was obtained without any difficulties and minimal blood loss.  A dressing was applied to the wound and wound care instructions were provided.   Findings 50 ml of bloody pleural fluid was obtained. A sample was sent to Pathology for cytogenetics, flow, and cell counts, as well as for infection analysis.  Complications:  None; patient tolerated the procedure well.          Condition: stable  Plan A follow up chest x-ray was ordered. Bed Rest for 1 hours. Tylenol 650 mg. for pain.  Attending Attestation: I was present and scrubbed for the entire procedure.  U/S used in the procedure.  Alyson ReedyWesam G. Yacoub, M.D. [facility]. Pager: [phone]. After hours pager: [phone].

## 2013-03-04 NOTE — Progress Notes (Signed)
[facility] PROGRESS NOTE  Gita Dilger ZOX:[id number] DOB: 04/29/1916 DOA: 02/25/2013 PCP: Illene Regulus, MD  Assessment/Plan: 80 year old who was admitted with PNA, hypoxemia, Diastolic Haert Failure. Her respiratory status got worse within 24-48 hour of admission. She was requiring NB. Her antibiotics has been change over course of hospitalization. She received IV lasix to treat for heart failure. Patient respiratory status has stabilize. She is now on % L of oxygen. Her WBC count continue to be elevated. Discussed case with ID. CT chest ordered. CT show right side pleural effusion. I have consulted pulmonary for thoracentesis. Patient was also diagnosed with DVT. She was started on coumadin/Lovenox.   1-Acute Respiratory Failure: Secondary to PNA and Heart failure exacerbation.  Continue with IV antibiotics day 7. ceftriaxone was change to Zosyn for anaerobes coverage on 1-22. Vancomycin 1/24---1/27. Received 5 days of Azithromycin.  Strep Pneumonia positive. Discussed with family, no BIPAP.  on 5 L NS.  Nebulizer treatments PRN.Influenza negative.  Chest x ray 1-24: Increased confluent infiltrate in right lung base, mainly involving the right middle lobe. Increased small bilateral pleural effusions. This probably reflect increased oxygen requirement during early  hospitalization.  WBC at 20. Discussed with ID, CT chest ordered.  Ct chest show large pleural effusion. I have consulted Pulmonary for thoracentesis. Will need order for LDH, cell count, protein and culture at time of thoracentesis.  Anticoagulation will need to be on hold.   2-History of CHF - last EF measured was in April 2013 was 55-60%.  Cardio consulted. BNP on admission at 10,383. Troponin times 3 negative. ECHO 1-21 Ef 55 %. Defer to cardio lasix doses. Negative 2.2 L.    3-Paroxysmal atrial fibrillation presently rate controlled - Cardizem. holad aspirin patient on lovenox.  4-Hyperglycemia - hemoglobin A1c6.7.   5-Pleuritic type of chest pain - probably from pneumonia. Resolved.  6-Hyperthyroidism -  Continue methimazole. TSH 0.9 7-Hypokalemia: resolved.  8-E coli UTI; on Zosyn.  9-Right  popliteal vein and right posterial tibial vein: on Lovenox and coumadin. Will hold coumadin tonight. Lovenox will need to be on hold prior to thoracentesis.  Pharmacy will arrange timing with pulmonary.   Code Status: DNR/DNI Family Communication: Care discussed with Granddaughter and daughter.  Disposition Plan: Remain inpatient. PT , OT consult.    Consultants:  Cardiology  Palliative  Procedures:  none  Antibiotics:  Ceftriaxone 1-21---122  Azithromycin 1-21--1-26  Zosyn 1-22---  Vancomycin 1-24----1-27  HPI/Subjective: Breathing better. Cough is not worse. No chest pain. She had a BM.   Objective: Filed Vitals:   03/04/13 0505  BP: 128/54  Pulse: 69  Temp: 98.1 F (36.7 C)  Resp: 19    Intake/Output Summary (Last 24 hours) at 03/04/13 1013 Last data filed at 03/03/13 1700  Gross per 24 hour  Intake    120 ml  Output      0 ml  Net    120 ml   Filed Weights   02/26/13 0043  Weight: 68.357 kg (150 lb 11.2 oz)    Exam:   General:  Alert, awake. In not acute distress.   Cardiovascular: S 1, S 2 RRR  Respiratory: Bilateral ronchus, no wheezes.   Abdomen: BS present, soft, NT  Musculoskeletal: trace edema.   Data Reviewed: Basic Metabolic Panel:  Recent Labs Lab 02/25/13 2055 02/26/13 0845  02/28/13 0417 03/01/13 0524 03/02/13 0430 03/03/13 0432 03/04/13 0530  NA 138 139  < > 137 142 139 143 140  K 3.3* 3.7  < >  4.0 3.8 3.4* 4.8 4.4  CL 96 99  < > 99 101 99 102 100  CO2 24 25  < > 24 25 25 26 24   GLUCOSE 160* 168*  < > 156* 126* 123* 117* 106*  BUN 26* 21  < > 31* 29* 26* 23 21  CREATININE 1.10 0.97  < > 1.04 0.99 0.93 0.94 0.89  CALCIUM 9.5 8.6  < > 8.7 8.7 8.4 8.7 8.8  MG  --  1.8  --   --   --   --   --   --   < > = values in this interval not  displayed. Liver Function Tests:  Recent Labs Lab 02/25/13 2055 02/26/13 0845  AST 18 16  ALT 24 20  ALKPHOS 112 106  BILITOT 0.9 0.9  PROT 6.4 5.7*  ALBUMIN 2.7* 2.4*   No results found for this basename: LIPASE, AMYLASE,  in the last 168 hours No results found for this basename: AMMONIA,  in the last 168 hours CBC:  Recent Labs Lab 02/25/13 2055 02/26/13 0845  02/28/13 0417 03/01/13 0524 03/02/13 0430 03/03/13 0432 03/04/13 0530  WBC 16.0* 16.5*  < > 20.9* 22.3* 20.0* 20.4* 20.1*  NEUTROABS 13.2* 14.3*  --   --   --   --   --  17.0*  HGB 11.1* 10.7*  < > 10.4* 10.2* 9.8* 9.9* 10.4*  HCT 33.4* 32.0*  < > 31.0* 29.5* 29.3* 29.5* 31.7*  MCV 84.6 84.7  < > 83.8 82.2 83.0 84.3 84.8  PLT 209 197  < > 192 185 223 272 362  < > = values in this interval not displayed. Cardiac Enzymes:  Recent Labs Lab 02/25/13 2055 02/26/13 0845 02/26/13 1715 02/26/13 2200  TROPONINI <0.30 <0.30 <0.30 <0.30   BNP (last 3 results)  Recent Labs  02/25/13 2055  PROBNP 10383.0*   CBG: No results found for this basename: GLUCAP,  in the last 168 hours  Recent Results (from the past 240 hour(s))  URINE CULTURE     Status: None   Collection Time    02/25/13 10:43 PM      Result Value Range Status   Specimen Description URINE, CLEAN CATCH   Final   Special Requests NONE   Final   Culture  Setup Time     Final   Value: 02/26/2013 10:41     Performed at [facility] Count     Final   Value: >=100,000 COLONIES/ML     Performed at [facility]   Culture     Final   Value: ESCHERICHIA COLI     Performed at [facility]   Report Status 02/28/2013 FINAL   Final   Organism ID, Bacteria ESCHERICHIA COLI   Final     Studies: No results found.  Scheduled Meds: . aspirin  325 mg Oral Daily  . calcium-vitamin D  1 tablet Oral BID WC  . diltiazem  120 mg Oral Daily  . enoxaparin (LOVENOX) injection  70 mg Subcutaneous Q24H  . feeding supplement  (ENSURE COMPLETE)  237 mL Oral BID  . feeding supplement (ENSURE)  1 Container Oral BID PC  . guaifenesin  200 mg Oral Q4H  . labetalol  200 mg Oral BID  . methimazole  5 mg Oral Daily  . multivitamin-lutein  1 capsule Oral Daily  . omega-3 acid ethyl esters  1 capsule Oral BID  . piperacillin-tazobactam (ZOSYN)  IV  3.375  g Intravenous Q8H  . polyethylene glycol  17 g Oral Daily  . sodium chloride  3 mL Intravenous Q12H  . vancomycin  1,000 mg Intravenous Q24H  . Warfarin - Pharmacist Dosing Inpatient   Does not apply q1800   Continuous Infusions:   Principal Problem:   Community acquired pneumonia Active Problems:   HYPERTHYROIDISM   HYPERTENSION   ATRIAL FIBRILLATION, PAROXYSMAL   Acute on chronic diastolic congestive heart failure, NYHA class 4   Acute respiratory failure   DNR (do not resuscitate)   DVT (deep venous thrombosis)    Time spent: 25 minutes.     Trai Ells  [facility] Pager [phone]. If 7PM-7AM, please contact night-coverage at [email or web address], password [email or web address] 03/04/2013, 10:13 AM  LOS: 7 days

## 2013-03-04 NOTE — Progress Notes (Signed)
     SUBJECTIVE: No complaints this am.   BP 128/54  Pulse 69  Temp(Src) 98.1 F (36.7 C) (Oral)  Resp 19  Ht 5\' 6"  (1.676 m)  Wt 150 lb 11.2 oz (68.357 kg)  BMI 24.34 kg/m2  SpO2 91%  Intake/Output Summary (Last 24 hours) at 03/04/13 1118 Last data filed at 03/03/13 1700  Gross per 24 hour  Intake    120 ml  Output      0 ml  Net    120 ml    PHYSICAL EXAM General: Well developed, well nourished, in no acute distress. Alert and oriented x 3.  Psych:  Good affect, responds appropriately Neck: No JVD. No masses noted.  Lungs: Clear bilaterally with no wheezes or rhonci noted.  Heart: Irreg irreg with no murmurs noted. Abdomen: Bowel sounds are present. Soft, non-tender.  Extremities: No lower extremity edema.   LABS: Basic Metabolic Panel:  Recent Labs  95/18/8401/26/15 0432 03/04/13 0530  NA 143 140  K 4.8 4.4  CL 102 100  CO2 26 24  GLUCOSE 117* 106*  BUN 23 21  CREATININE 0.94 0.89  CALCIUM 8.7 8.8   CBC:  Recent Labs  03/03/13 0432 03/04/13 0530  WBC 20.4* 20.1*  NEUTROABS  --  17.0*  HGB 9.9* 10.4*  HCT 29.5* 31.7*  MCV 84.3 84.8  PLT 272 362   Current Meds: . aspirin  325 mg Oral Daily  . calcium-vitamin D  1 tablet Oral BID WC  . diltiazem  120 mg Oral Daily  . enoxaparin (LOVENOX) injection  70 mg Subcutaneous Q24H  . feeding supplement (ENSURE COMPLETE)  237 mL Oral BID  . feeding supplement (ENSURE)  1 Container Oral BID PC  . guaifenesin  200 mg Oral Q4H  . labetalol  200 mg Oral BID  . methimazole  5 mg Oral Daily  . multivitamin-lutein  1 capsule Oral Daily  . omega-3 acid ethyl esters  1 capsule Oral BID  . piperacillin-tazobactam (ZOSYN)  IV  3.375 g Intravenous Q8H  . polyethylene glycol  17 g Oral Daily  . sodium chloride  3 mL Intravenous Q12H  . Warfarin - Pharmacist Dosing Inpatient   Does not apply q1800     ASSESSMENT AND PLAN:  1. Atrial fibrillation: Rate controlled on Cardizem CD 120 mg Qdaily. Coumadin has been  started. I think this is reasonable given DVT and atrial fib. Pt and family understands potential bleeding risk. Given advanced age she is at a higher risk of bleeding on any agent.  2. DVT: Currently on Lovenox with overlap with coumadin.   3. Pneumonia: Per primary team.   4. Acute on chronic diastolic CHF: LV EF 60-65%. Volume is ok. No weight in chart since 02/26/13. Will ask for daily weights and strict I/O.   5. HTN: BP controlled.    Danielle Adams  1/27/201511:18 AM

## 2013-03-04 NOTE — Progress Notes (Addendum)
Clinical Social Work Department CLINICAL SOCIAL WORK PLACEMENT NOTE 03/04/2013  Patient:  Danielle Adams,Danielle Adams  Account Number:  [id number] Admit date:  02/25/2013  Clinical Social Worker:  Sharol HarnessPOONUM Aidenn Skellenger, Theresia MajorsLCSWA  Date/time:  03/04/2013 11:00 AM  Clinical Social Work is seeking post-discharge placement for this patient at the following level of care:   SKILLED NURSING   (*CSW will update this form in Epic as items are completed)   03/04/2013  Patient/family provided with [facility] Department of Clinical Social Work's list of facilities offering this level of care within the geographic area requested by the patient (or if unable, by the patient's family).  03/04/2013  Patient/family informed of their freedom to choose among providers that offer the needed level of care, that participate in Medicare, Medicaid or managed care program needed by the patient, have an available bed and are willing to accept the patient.  03/04/2013  Patient/family informed of [facility]' ownership interest in [facility], as well as of the fact that they are under no obligation to receive care at this facility.  PASARR submitted to EDS on 03/04/2013 PASARR number received from EDS on 03/04/2013  FL2 transmitted to all facilities in geographic area requested by pt/family on  03/04/2013 FL2 transmitted to all facilities within larger geographic area on   Patient informed that his/her managed care company has contracts with or will negotiate with  certain facilities, including the following:     Patient/family informed of bed offers received:  03/07/2013  [facility] recommends and patient chooses bed at    [facility]  [facility]/2013 [facility]  The following physician request were entered in Epic:   Additional Comments:  Messi Twedt, LCSWA [phone]

## 2013-03-04 NOTE — Progress Notes (Signed)
NUTRITION FOLLOW UP  Intervention:    Change Ensure Pudding and Ensure Complete to prn per patient request.  Nutritional Services Ambassador is assisting with meal options.  Nutrition Dx:   Inadequate oral intake related to decreased appetite as evidenced by pt's daughters report. Ongoing.  Goal:   Intake to meet >90% of estimated nutrition needs. Progressing.  Monitor:   PO intake, labs, weight trend.  Assessment:   80 y.o. female with history of hypertension, hypothyroidism, paroxysmal atrial fibrillation, CHF was brought to the ER on 1/20 after patient was having increasing shortness of breath over the last one week.  Patient reports that her intake has improved. She does not really like the Ensure. Per RN, patient has not been taking the Ensure when offered. Patient requests that someone assist her with setting up her meals in the mornings, RN aware.  Height: Ht Readings from Last 1 Encounters:  03/04/13 5\' 6"  (1.676 m)    Weight Status:   Wt Readings from Last 1 Encounters:  03/04/13 161 lb 3.2 oz (73.12 kg)  02/26/13  150 lb 11.2 oz (68.357 kg)   Re-estimated needs:  Kcal: 1400-1600  Protein: 70-80 grams  Fluid: 1.4-1.6 L/day  Skin: no wounds  Diet Order: Cardiac   Intake/Output Summary (Last 24 hours) at 03/04/13 1400 Last data filed at 03/04/13 0830  Gross per 24 hour  Intake    300 ml  Output    175 ml  Net    125 ml    Last BM: 1/20   Labs:   Recent Labs Lab 02/25/13 2055 02/26/13 0845  03/02/13 0430 03/03/13 0432 03/04/13 0530  NA 138 139  < > 139 143 140  K 3.3* 3.7  < > 3.4* 4.8 4.4  CL 96 99  < > 99 102 100  CO2 24 25  < > 25 26 24   BUN 26* 21  < > 26* 23 21  CREATININE 1.10 0.97  < > 0.93 0.94 0.89  CALCIUM 9.5 8.6  < > 8.4 8.7 8.8  MG  --  1.8  --   --   --   --   GLUCOSE 160* 168*  < > 123* 117* 106*  < > = values in this interval not displayed.  CBG (last 3)  No results found for this basename: GLUCAP,  in the last 72  hours  Scheduled Meds: . aspirin  325 mg Oral Daily  . calcium-vitamin D  1 tablet Oral BID WC  . diltiazem  120 mg Oral Daily  . enoxaparin (LOVENOX) injection  70 mg Subcutaneous Q12H  . feeding supplement (ENSURE COMPLETE)  237 mL Oral BID  . feeding supplement (ENSURE)  1 Container Oral BID PC  . guaifenesin  200 mg Oral Q4H  . labetalol  200 mg Oral BID  . methimazole  5 mg Oral Daily  . multivitamin-lutein  1 capsule Oral Daily  . omega-3 acid ethyl esters  1 capsule Oral BID  . piperacillin-tazobactam (ZOSYN)  IV  3.375 g Intravenous Q8H  . polyethylene glycol  17 g Oral Daily  . sodium chloride  3 mL Intravenous Q12H  . warfarin  2.5 mg Oral ONCE-1800  . Warfarin - Pharmacist Dosing Inpatient   Does not apply q1800    Continuous Infusions: None   Joaquin CourtsKimberly Harris, RD, LDN, CNSC Pager [phone] After Hours Pager [phone]

## 2013-03-04 NOTE — Progress Notes (Signed)
PT Cancellation Note  Patient Details Name: Dannielle Karvonenlise Hoppel MRN: [id number] DOB: April 16, 1916   Cancelled Treatment:    Reason Eval/Treat Not Completed: Other (comment) (MD in room speaking with family).  PT to check back as time allows.    Thanks,   Rollene Rotundaebecca B. Alianis Trimmer, PT, DPT [phone]   03/04/2013, 2:53 PM

## 2013-03-04 NOTE — Progress Notes (Signed)
OT Cancellation Note  Patient Details Name: Dannielle Karvonenlise Barbuto MRN: [id number] DOB: 17-Jul-1916   Cancelled Treatment:    Reason Eval/Treat Not Completed: Patient at procedure or test/ unavailable. Pt having pleural fluid being drawn. Will re-attempt later.  Earlie RavelingStraub, Cari Burgo L OTR/L [phone]  03/04/2013, 3:58 PM

## 2013-03-04 NOTE — Consult Note (Signed)
Name: Danielle Adams MRN: [id number] DOB: [birth year]    ADMISSION DATE:  02/25/2013 CONSULTATION DATE:  03/22/2013  REFERRING MD :  Carmell Austria  PRIMARY SERVICE:  TRH  CHIEF COMPLAINT:  Pleural effusion  BRIEF PATIENT DESCRIPTION: 80 year old female with history of PAF and HTN admitted to [facility] 1/21 with CAP. After failing traditional CAP therapies a chest CT was obtained which showed a R pleural effusion. PCCM has been asked to see.  SIGNIFICANT EVENTS / STUDIES:  1/21 - Admitted to [facility] Mar 22, 2022 - CT Chest  > R pleural effusion  LINES / TUBES:   CULTURES: 1/20 - Urine > E. Coli: Pan sensitive. 1/21 - Strep Pneumo Urinary Antigen > Positive  ANTIBIOTICS: Rocephin Mar 22, 2022 >>> Zosyn 1/24 03-22-2022 Vanc 124 2022/03/22  HISTORY OF PRESENT ILLNESS:  Danielle Adams is a 80 y.o. female with history of hypertension, hypothyroidism, paroxysmal atrial fibrillation, CHF was brought to the ER after patient was having increasing shortness of breath over the last one week. Patient was also noticed to be having increasing productive cough. Patient also has pleuritic type of chest pain on the right side. In the ER patient was noted to have elevated white cell count with elevated BNP and chest x-ray features concerning for pneumonia. Patient was admitted 1/21 and started on empiric antibiotics for pneumonia. Despite escalating antibiotic therapy several times, there was only mild symptom improvement and WBC continued to rise. Primary team, consulted with ID who rec'd a chest CT scan which revealed a large R pleural effusion. PCCM has been asked to see.   PAST MEDICAL HISTORY :  Past Medical History  Diagnosis Date  . Rapid heart rate     arrythmia  . HTN (hypertension)   . Familial tremor   . Allergic rhinitis, cause unspecified 01/25/2011  . Thyroid disease    Past Surgical History  Procedure Laterality Date  . Tonsillectomy    . Dilation and curettage of uterus    . Bunionectomy    . Laparoscopic ovarian  cystectomy  04/2009   Prior to Admission medications   Medication Sig Start Date End Date Taking? Authorizing Provider  aspirin 325 MG tablet Take 325 mg by mouth daily.    Yes Historical Provider, MD  Calcium Carbonate-Vitamin D (CALTRATE 600+D) 600-400 MG-UNIT per tablet Take 1 tablet by mouth 2 (two) times daily.    Yes Historical Provider, MD  diltiazem (DILACOR XR) 180 MG 24 hr capsule Take 180 mg by mouth daily.   Yes Historical Provider, MD  docusate sodium (COLACE) 100 MG capsule Take 1 capsule (100 mg total) by mouth as needed. Stool Softener 06/22/10  Yes Jacques Navy, MD  furosemide (LASIX) 40 MG tablet Take 1 tablet (40 mg total) by mouth daily. 07/16/12  Yes Jacques Navy, MD  labetalol (NORMODYNE) 200 MG tablet Take 200 mg by mouth 2 (two) times daily.   Yes Historical Provider, MD  methimazole (TAPAZOLE) 5 MG tablet Take 5 mg by mouth daily.   Yes Historical Provider, MD  Multiple Vitamins-Minerals (EYE VITAMINS) CAPS Take 1 each by mouth daily.    Yes Historical Provider, MD  OMEGA 3 1000 MG CAPS Take 1 capsule by mouth 2 (two) times daily.    Yes Historical Provider, MD   No Known Allergies  FAMILY HISTORY:  Family History  Problem Relation Age of Onset  . Pancreatic cancer Mother   . Coronary artery disease Father   . Diabetes Sister   . Diabetes Brother  SOCIAL HISTORY:  reports that she has never smoked. She does not have any smokeless tobacco history on file. She reports that she does not drink alcohol or use illicit drugs.  Review of Systems:   Bolds are positive  Constitutional: weight loss, gain, night sweats, Fevers, chills, fatigue .  HEENT: headaches, Sore throat, sneezing, nasal congestion, post nasal drip, Difficulty swallowing, Tooth/dental problems, visual complaints visual changes, ear ache CV:  chest pain, radiates: ,Orthopnea, PND, swelling in lower extremities, dizziness, palpitations, syncope.  GI  heartburn, indigestion, abdominal pain,  nausea, vomiting, diarrhea, change in bowel habits, loss of appetite, bloody stools.  Resp: cough, productive: , hemoptysis, dyspnea, chest pain, pleuritic.  Skin: rash or itching or icterus GU: dysuria, change in color of urine, urgency or frequency. flank pain, hematuria  MS: joint pain or swelling. decreased range of motion  Psych: change in mood or affect. depression or anxiety.  Neuro: difficulty with speech, weakness, numbness, ataxia   SUBJECTIVE:   VITAL SIGNS: Temp:  [97.8 F (36.6 C)-98.1 F (36.7 C)] 97.9 F (36.6 C) (01/27 1235) Pulse Rate:  [65-71] 71 (01/27 1235) Resp:  [19] 19 (01/27 1235) BP: (124-144)/(46-54) 124/46 mmHg (01/27 1235) SpO2:  [91 %-95 %] 95 % (01/27 1235) Weight:  [73.12 kg (161 lb 3.2 oz)] 73.12 kg (161 lb 3.2 oz) (01/27 1100)  PHYSICAL EXAMINATION: General:  80 year old female or normal body habitus Neuro:  Awake, alert, oriented HEENT:  [place], PERRL Neck:  Supple, no jvd Cardiovascular: RRR Lungs: Diminished R >L Abdomen:  Soft, non-tender, non- distended Musculoskeletal:  Intact Skin:  Intact   Recent Labs Lab 03/02/13 0430 03/03/13 0432 03/04/13 0530  NA 139 143 140  K 3.4* 4.8 4.4  CL 99 102 100  CO2 25 26 24   BUN 26* 23 21  CREATININE 0.93 0.94 0.89  GLUCOSE 123* 117* 106*    Recent Labs Lab 03/02/13 0430 03/03/13 0432 03/04/13 0530  HGB 9.8* 9.9* 10.4*  HCT 29.3* 29.5* 31.7*  WBC 20.0* 20.4* 20.1*  PLT 223 272 362   No results found.  ASSESSMENT / PLAN:  CAP (Strep Pneumo) Right Pleural Effusion - DDx include heart failure related fluid versus infected fluid. Thoracentesis/Needle aspiration of pleural fluid - a diagnostic tap only, if infected then will place a wayne catheter for drainage, patient would not want VATS and treat with abx and allow time.  If transudative then it is heart failure related in which case no need for additional procedure..  - Hold lovenox after tonights 11 PM dose for potential for a wayne  cathter in AM. - Continue ceftriaxone. - CXR and fluid analysis as ordered.  Alyson ReedyWesam G. Tauriel Scronce, M.D. Pulmonary and Critical Care Medicine [facility] Pager: [phone]  03/04/2013, 2:22 PM

## 2013-03-04 NOTE — Progress Notes (Signed)
Physical Therapy Treatment Patient Details Name: Danielle Adams MRN: [id number] DOB: 04-08-1916 Today's Date: 03/04/2013 Time: [id number] PT Time Calculation (min): 77 min  PT Assessment / Plan / Recommendation  History of Present Illness 80 y.o. [facility] with history of hypertension, hypothyroidism, paroxysmal atrial fibrillation, CHF was brought to the ER after patient was having increasing shortness of breath over the last one week. Patient was also noticed to be having increasing productive cough. Patient also has pleuritic type of chest pain on the right side. In the ER patient was noted to have elevated white cell count with elevated BNP and chest x-ray features concerning for pneumonia.  Pt s/p R thoracentesis on 03/04/13.     PT Comments   Pt continues to be extremely fatigued and deconditioned.  She is reluctant, but agreeable to working with PT due to fatigue and lack of energy.  She is still mod to max assist just to stand and pivot out of the bed and after doing that twice, she needed mechanical lift (sara plus) to get safely back in the bed for thoracentesis procedure.    Follow Up Recommendations  SNF     Does the patient have the potential to tolerate intense rehabilitation    NA  Barriers to Discharge   She is too weak to return home with daughter's assist.        Equipment Recommendations  Rolling walker with 5" wheels    Recommendations for Other Services   NA  Frequency Min 2X/week   Progress towards PT Goals Progress towards PT goals: Progressing toward goals  Plan Current plan remains appropriate    Precautions / Restrictions Precautions Precautions: Fall;Other (comment) Precaution Comments: Monitor O2 sats   Pertinent Vitals/Pain HR mid 50s- low 70s during mobility.  O2 sats 90% on 6 L O2 [place] with DOE 2-3/4 with mobility.      Mobility  Bed Mobility Overal bed mobility: Needs Assistance Bed Mobility: Supine to Sit;Rolling;Sit to  Supine Rolling: Min assist Supine to sit: Mod assist;HOB elevated Sit to supine: Max assist General bed mobility comments: mod assist to support trunk and weight shift hips to get to EOB on compliant mattress.  Verbal cues to use arms on bed rails to help pull.  Pt with mild posterior lean throughout transition.  Transfers Overall transfer level: Needs assistance Equipment used: None;Rolling walker (2 wheeled) Transfers: Sit to/from Stand Sit to Stand: Mod assist Stand pivot transfers: Max assist General transfer comment: [facility] and [facility] elevated bed using bedrails and arm rest on BSC.  Stood for peri care x 1 and then transferred into the recliner chair.  Exercises completed in the recliner and then PT informed of throacentesis proceedure.  Pt extrememly fatigued after activity of toileting and then getting to low recliner chair, so [place] DecSara Plus used to get back to the bed.  This went well with pt fatigued.  Mod assist to support trunk in static standing, max assist to support her while she attempts to take steps to transfer.  Posterior lean in standing as well.      Exercises General Exercises - Upper Extremity Shoulder Flexion: AROM;Both;10 reps;Seated Elbow Flexion: AROM;Both;10 reps;Seated General Exercises - Lower Extremity Long Arc Quad: AROM;Both;10 reps;Seated Hip Flexion/Marching: AROM;Both;10 reps;Seated Toe Raises: AROM;Both;10 reps;Seated Heel Raises: AROM;Both;10 reps;Seated    PT Goals (current goals can now be found in the care plan section) Acute Rehab PT Goals Patient Stated Goal: to get stronger and go  home  Visit Information  Last PT Received On: 03/04/13 Assistance Needed: +2 (+2 would be helpful) Reason Eval/Treat Not Completed: Other (comment) (MD in room speaking with family) History of Present Illness: 80 y.o. female admitted [facility] on 02/25/13 with history of hypertension, hypothyroidism, paroxysmal atrial fibrillation, CHF was brought to the ER  after patient was having increasing shortness of breath over the last one week. Patient was also noticed to be having increasing productive cough. Patient also has pleuritic type of chest pain on the right side. In the ER patient was noted to have elevated white cell count with elevated BNP and chest x-ray features concerning for pneumonia.  Pt s/p R thoracentesis on 03/04/13.      Subjective Data  Subjective: Pt reports fatigue.  Reluctant, but agreeable to work with PT.   Patient Stated Goal: to get stronger and go home   Cognition  Cognition Arousal/Alertness: Awake/alert Behavior During Therapy: WFL for tasks assessed/performed Overall Cognitive Status: Within Functional Limits for tasks assessed    Balance  Balance Sitting-balance support: Bilateral upper extremity supported;Feet supported Sitting balance-Leahy Scale: Poor Sitting balance - Comments: needs min assit due to posterior lean to maintain balance in sitting.  Postural control: Posterior lean Standing balance support: Bilateral upper extremity supported Standing balance-Leahy Scale: Zero General Comments General comments (skin integrity, edema, etc.): Pt with leaking indwelling foley catheter.  RN made aware.  O2 sats and HR stable during activity.  O2 on 6 L O2 [place] in the low 90s HR ranged from mid 50s to low 70s with mobility.  DOE 2-3/4  End of Session PT - End of Session Equipment Utilized During Treatment: Gait belt;Oxygen Activity Tolerance: Patient limited by fatigue;Treatment limited secondary to medical complications (Comment);Other (comment) (limited by DOE) Patient left: in bed;with call bell/phone within reach;with bed alarm set;with family/visitor present Nurse Communication: Mobility status;Other (comment) (leaking catheter)     Danielle Adams, PT, DPT [phone]   03/04/2013, 4:24 PM

## 2013-03-04 NOTE — Consult Note (Signed)
  [facility]  Total days of antibiotics 8        Day 6 piptazo        Day 4 vanco        (finished 5 days of azithro, 2 days ctx)       Reason for Consult:leukocytosis    Referring Physician: regalalado  Principal Problem:   Community acquired pneumonia Active Problems:   HYPERTHYROIDISM   HYPERTENSION   ATRIAL FIBRILLATION, PAROXYSMAL   Acute on chronic diastolic congestive heart failure, NYHA class 4   Acute respiratory failure   DNR (do not resuscitate)   DVT (deep venous thrombosis)    HPI: Danielle Adams is a 80 y.o. female HTN, afib who was admitted to the hospital for RML pneumonia. Initially was placed on CAP regimen but changed to piptazo and vanco due to not improving. Infectious work up showed + urine strep antigen, ur cx + pansensitive ecoli, but asymptomatic bacturia. Her wbc has stayed at 20K and of late, her plt increased to 362. The patient is no longer febrile but not significantly improved despite broad spectrum antibiotics. She had chest CT done today that shows large right sided pleural effusion. The patient reports feeling only slightly better, still has productive cough. Her symptoms started on 1/10 thus now roughly over 2 wks of feeling poorly. She denies fever, chills, nightsweats. Her daughter who she lives with provided some of the details. Prior to this hospitalization she was caring for herself, going to the [employer], and attending church.  Past Medical History  Diagnosis Date  . Rapid heart rate     arrythmia  . HTN (hypertension)   . Familial tremor   . Allergic rhinitis, cause unspecified 01/25/2011  . Thyroid disease     Allergies: No Known Allergies    MEDICATIONS: . aspirin  325 mg Oral Daily  . calcium-vitamin D  1 tablet Oral BID WC  . diltiazem  120 mg Oral Daily  . enoxaparin (LOVENOX) injection  70 mg Subcutaneous Q12H  . feeding supplement (ENSURE COMPLETE)  237 mL Oral BID  . feeding supplement (ENSURE)  1  Container Oral BID PC  . guaifenesin  200 mg Oral Q4H  . labetalol  200 mg Oral BID  . methimazole  5 mg Oral Daily  . multivitamin-lutein  1 capsule Oral Daily  . omega-3 acid ethyl esters  1 capsule Oral BID  . piperacillin-tazobactam (ZOSYN)  IV  3.375 g Intravenous Q8H  . polyethylene glycol  17 g Oral Daily  . sodium chloride  3 mL Intravenous Q12H  . warfarin  2.5 mg Oral ONCE-1800  . Warfarin - Pharmacist Dosing Inpatient   Does not apply q1800    History  Substance Use Topics  . Smoking status: Never Smoker   . Smokeless tobacco: Not on file  . Alcohol Use: No    Family History  Problem Relation Age of Onset  . Pancreatic cancer Mother   . Coronary artery disease Father   . Diabetes Sister   . Diabetes Brother      Review of Systems  Constitutional: Negative for fever, chills, diaphoresis, activity change, appetite change, fatigue and unexpected weight change.  HENT: Negative for congestion, sore throat, rhinorrhea, sneezing, trouble swallowing and sinus pressure.  Eyes: Negative for photophobia and visual disturbance.  Respiratory: positive for cough,but negative for chest tightness, shortness of breath, wheezing and stridor.  Cardiovascular: Negative for chest pain, palpitations and leg swelling.  Gastrointestinal: Negative   for nausea, vomiting, abdominal pain, diarrhea, constipation, blood in stool, abdominal distention and anal bleeding.  Genitourinary: Negative for dysuria, hematuria, flank pain and difficulty urinating.  Musculoskeletal: Negative for myalgias, back pain, joint swelling, arthralgias and gait problem.  Skin: Negative for color change, pallor, rash and wound.  Neurological: Negative for dizziness, tremors, weakness and light-headedness.  Hematological: Negative for adenopathy. Does not bruise/bleed easily.  Psychiatric/Behavioral: Negative for behavioral problems, confusion, sleep disturbance, dysphoric mood, decreased concentration and agitation.       OBJECTIVE: Temp:  [97.8 F (36.6 C)-98.1 F (36.7 C)] 97.9 F (36.6 C) (01/27 1235) Pulse Rate:  [65-71] 71 (01/27 1235) Resp:  [19] 19 (01/27 1235) BP: (124-144)/(46-54) 124/46 mmHg (01/27 1235) SpO2:  [91 %-95 %] 95 % (01/27 1235) Weight:  [161 lb 3.2 oz (73.12 kg)] 161 lb 3.2 oz (73.12 kg) (01/27 1100) Physical Exam  Constitutional:  oriented to person, place, and time.  appears well-developed and well-nourished. No distress.  HENT:  Mouth/Throat: Oropharynx is clear and moist. No oropharyngeal exudate.  Cardiovascular: Normal rate, regular rhythm and normal heart sounds. Exam reveals no gallop and no friction rub.  No murmur heard.  Pulmonary/Chest: Effort normal and breath sounds normal.decreased at right base No respiratory distress.  no wheezes.  Abdominal: Soft. Bowel sounds are normal.  exhibits no distension. There is no tenderness.  Lymphadenopathy:  no cervical adenopathy.  Neurological:  alert and oriented to person, place, and time.  Skin: Skin is warm and dry. No rash noted. No erythema.  Psychiatric:  a normal mood and affect. behavior is normal.    LABS: Results for orders placed during the hospital encounter of 02/25/13 (from the past 48 hour(s))  CBC     Status: Abnormal   Collection Time    03/03/13  4:32 AM      Result Value Range   WBC 20.4 (*) 4.0 - 10.5 K/uL   RBC 3.50 (*) 3.87 - 5.11 MIL/uL   Hemoglobin 9.9 (*) 12.0 - 15.0 g/dL   HCT 29.5 (*) 36.0 - 46.0 %   MCV 84.3  78.0 - 100.0 fL   MCH 28.3  26.0 - 34.0 pg   MCHC 33.6  30.0 - 36.0 g/dL   RDW 15.3  11.5 - 15.5 %   Platelets 272  150 - 400 K/uL  BASIC METABOLIC PANEL     Status: Abnormal   Collection Time    03/03/13  4:32 AM      Result Value Range   Sodium 143  137 - 147 mEq/L   Potassium 4.8  3.7 - 5.3 mEq/L   Comment: DELTA CHECK NOTED   Chloride 102  96 - 112 mEq/L   CO2 26  19 - 32 mEq/L   Glucose, Bld 117 (*) 70 - 99 mg/dL   BUN 23  6 - 23 mg/dL   Creatinine, Ser 0.94  0.50 -  1.10 mg/dL   Calcium 8.7  8.4 - 10.5 mg/dL   GFR calc non Af Amer 50 (*) >90 mL/min   GFR calc Af Amer 58 (*) >90 mL/min   Comment: (NOTE)     The eGFR has been calculated using the CKD EPI equation.     This calculation has not been validated in all clinical situations.     eGFR's persistently <90 mL/min signify possible Chronic Kidney     Disease.  PROTIME-INR     Status: Abnormal   Collection Time    03/03/13  1:00 PM  Result Value Range   Prothrombin Time 16.5 (*) 11.6 - 15.2 seconds   INR 1.37  0.00 - 1.49  CBC WITH DIFFERENTIAL     Status: Abnormal   Collection Time    03/04/13  5:30 AM      Result Value Range   WBC 20.1 (*) 4.0 - 10.5 K/uL   RBC 3.74 (*) 3.87 - 5.11 MIL/uL   Hemoglobin 10.4 (*) 12.0 - 15.0 g/dL   HCT 31.7 (*) 36.0 - 46.0 %   MCV 84.8  78.0 - 100.0 fL   MCH 27.8  26.0 - 34.0 pg   MCHC 32.8  30.0 - 36.0 g/dL   RDW 15.4  11.5 - 15.5 %   Platelets 362  150 - 400 K/uL   Comment: DELTA CHECK NOTED   Neutrophils Relative % 84 (*) 43 - 77 %   Neutro Abs 17.0 (*) 1.7 - 7.7 K/uL   Lymphocytes Relative 7 (*) 12 - 46 %   Lymphs Abs 1.4  0.7 - 4.0 K/uL   Monocytes Relative 8  3 - 12 %   Monocytes Absolute 1.6 (*) 0.1 - 1.0 K/uL   Eosinophils Relative 1  0 - 5 %   Eosinophils Absolute 0.2  0.0 - 0.7 K/uL   Basophils Relative 0  0 - 1 %   Basophils Absolute 0.0  0.0 - 0.1 K/uL  BASIC METABOLIC PANEL     Status: Abnormal   Collection Time    03/04/13  5:30 AM      Result Value Range   Sodium 140  137 - 147 mEq/L   Potassium 4.4  3.7 - 5.3 mEq/L   Chloride 100  96 - 112 mEq/L   CO2 24  19 - 32 mEq/L   Glucose, Bld 106 (*) 70 - 99 mg/dL   BUN 21  6 - 23 mg/dL   Creatinine, Ser 0.89  0.50 - 1.10 mg/dL   Calcium 8.8  8.4 - 10.5 mg/dL   GFR calc non Af Amer 53 (*) >90 mL/min   GFR calc Af Amer 62 (*) >90 mL/min   Comment: (NOTE)     The eGFR has been calculated using the CKD EPI equation.     This calculation has not been validated in all clinical  situations.     eGFR's persistently <90 mL/min signify possible Chronic Kidney     Disease.  PROTIME-INR     Status: None   Collection Time    03/04/13  5:30 AM      Result Value Range   Prothrombin Time 14.6  11.6 - 15.2 seconds   INR 1.16  0.00 - 1.49    MICRO: 1/20 ur cx: ecoli - pan S  Assessment/Plan:  80yo F with  strep pneumonia and parapneumonic effusion  1) would discontinue vancomycin and piptazo, change back to ceftriaxone 1gm IV daily 2) would drain effusion, please send out fluid for ldh, protein, aerobic culture  Treydon Henricks B. [facility] [phone]

## 2013-03-04 NOTE — Progress Notes (Addendum)
ANTICOAGULATION CONSULT NOTE - Follow Up Consult  Pharmacy Consult for lovenox and coumadin; zosyn Indication: new DVT, afib; PNA, UTI  No Known Allergies  Patient Measurements: Height: 5\' 6"  (167.6 cm) Weight: 150 lb 11.2 oz (68.357 kg) IBW/kg (Calculated) : 59.3   Vital Signs: Temp: 98.1 F (36.7 C) (01/27 0505) Temp src: Oral (01/27 0505) BP: 128/54 mmHg (01/27 0505) Pulse Rate: 69 (01/27 0505)  Labs:  Recent Labs  03/02/13 0430 03/03/13 0432 03/03/13 1300 03/04/13 0530  HGB 9.8* 9.9*  --  10.4*  HCT 29.3* 29.5*  --  31.7*  PLT 223 272  --  362  LABPROT  --   --  16.5* 14.6  INR  --   --  1.37 1.16  CREATININE 0.93 0.94  --  0.89    Estimated Creatinine Clearance: 35.4 ml/min (by C-G formula based on Cr of 0.89).   Assessment: Patient is a 80 y.o F on anticoagulation overlap day #2 of 5 days minimum for new DVT.  Her scr continues to trend down with 0.89 today (est crcl~ 36). INR is 1.16 after one dose of 2.5mg  given last night. CBC is stable.  No bleeding documented.  She's also in abx day #6 for PNA and UTI.   Azithro 1/22>>1/26 Zosyn 1/22>> vanco 1/24>>2/27  1/20 ucx>> ecoli pan sen FINAL  Goal of Therapy:  INR 2-3 Heparin level 0.3-0.7 units/ml Monitor platelets by anticoagulation protocol: Yes   Plan:  1) change lovenox to 70mg  SQ q12h 2) coumadin 2.5mg  PO x1 today 3) no change for zosyn dose.  Please indicate plan/LOT for abx  Fallon Howerter P 03/04/2013,11:11 AM  Adden (2:39 PM):  Spoke to Dr. Molli KnockYacoub-- hold the 11 AM lovenox dose on 1/28 for thoracentesis procedure.  Will also hold coumadin for now for procedure.

## 2013-03-04 NOTE — Progress Notes (Signed)
CSW Proofreader(Clinical Social Worker) spoke with pt daughter and provided with bed offers (pt sleeping during CSW visit). Pt daughter stated that [facility] was [facility]. CSW explained that facility did offer a bed but will be pending bed availability when pt is medically stable for dc.  Stefano Trulson, LCSWA [phone]

## 2013-03-05 DIAGNOSIS — I509 Heart failure, unspecified: Secondary | ICD-10-CM | POA: Diagnosis not present

## 2013-03-05 DIAGNOSIS — I4891 Unspecified atrial fibrillation: Secondary | ICD-10-CM | POA: Diagnosis not present

## 2013-03-05 DIAGNOSIS — J189 Pneumonia, unspecified organism: Secondary | ICD-10-CM | POA: Diagnosis not present

## 2013-03-05 DIAGNOSIS — I5033 Acute on chronic diastolic (congestive) heart failure: Secondary | ICD-10-CM | POA: Diagnosis not present

## 2013-03-05 DIAGNOSIS — J9600 Acute respiratory failure, unspecified whether with hypoxia or hypercapnia: Secondary | ICD-10-CM | POA: Diagnosis not present

## 2013-03-05 DIAGNOSIS — J90 Pleural effusion, not elsewhere classified: Secondary | ICD-10-CM | POA: Diagnosis not present

## 2013-03-05 DIAGNOSIS — I82409 Acute embolism and thrombosis of unspecified deep veins of unspecified lower extremity: Secondary | ICD-10-CM | POA: Diagnosis not present

## 2013-03-05 LAB — BASIC METABOLIC PANEL
BUN: 22 mg/dL (ref 6–23)
CHLORIDE: 103 meq/L (ref 96–112)
CO2: 22 mEq/L (ref 19–32)
CREATININE: 0.86 mg/dL (ref 0.50–1.10)
Calcium: 8.6 mg/dL (ref 8.4–10.5)
GFR calc Af Amer: 64 mL/min — ABNORMAL LOW (ref >90)
GFR calc non Af Amer: 56 mL/min — ABNORMAL LOW (ref >90)
GLUCOSE: 105 mg/dL — AB (ref 70–99)
Potassium: 4 mEq/L (ref 3.7–5.3)
Sodium: 141 mEq/L (ref 137–147)

## 2013-03-05 LAB — CBC
HEMATOCRIT: 32.8 % — AB (ref 36.0–46.0)
HEMOGLOBIN: 11.2 g/dL — AB (ref 12.0–15.0)
MCH: 28.9 pg (ref 26.0–34.0)
MCHC: 34.1 g/dL (ref 30.0–36.0)
MCV: 84.8 fL (ref 78.0–100.0)
Platelets: 344 10*3/uL (ref 150–400)
RBC: 3.87 MIL/uL (ref 3.87–5.11)
RDW: 15.6 % — ABNORMAL HIGH (ref 11.5–15.5)
WBC: 16.3 10*3/uL — AB (ref 4.0–10.5)

## 2013-03-05 LAB — PH, BODY FLUID: pH, Fluid: 8.5

## 2013-03-05 LAB — PROTIME-INR
INR: 1.19 (ref 0.00–1.49)
Prothrombin Time: 14.8 seconds (ref 11.6–15.2)

## 2013-03-05 MED ORDER — ENOXAPARIN SODIUM 80 MG/0.8ML ~~LOC~~ SOLN
70.0000 mg | Freq: Two times a day (BID) | SUBCUTANEOUS | Status: DC
  Administered 2013-03-05 – 2013-03-07 (×4): 70 mg via SUBCUTANEOUS
  Filled 2013-03-05 (×6): qty 0.8

## 2013-03-05 NOTE — Progress Notes (Signed)
[facility] PROGRESS NOTE  Shaunna Rosetti ZOX:[id number] DOB: 05-27-1916 DOA: 02/25/2013 PCP: Illene Regulus, MD  Assessment/Plan: 80 year old who was admitted with PNA, hypoxemia, Diastolic Heart Failure. Her respiratory status got worse within 24-48 hour of admission. She was requiring NRB. Her antibiotics has been change over course of hospitalization. She received IV lasix to treat for heart failure. Patient respiratory status has stabilize. She is now on % L of oxygen. Her WBC count continue to be elevated. Discussed case with ID. CT chest ordered. CT show right side pleural effusion. I have consulted pulmonary for thoracentesis. Patient was also diagnosed with DVT. She was started on coumadin/Lovenox.   1-Acute Respiratory Failure: Secondary to PNA and Heart failure exacerbation.  Continue with IV antibiotics day 7. ceftriaxone was change to Zosyn for anaerobes coverage on 1-22. Vancomycin 1/24---1/27. Received 5 days of Azithromycin. Rocephin has been restarted per ID recommendations. Strep Pneumonia urine Ag positive. Discussed with family, no BIPAP.  on 5 L NS.  Nebulizer treatments PRN.Influenza negative.  Chest x ray 1-24: Increased confluent infiltrate in right lung base, mainly involving the right middle lobe. Increased small bilateral pleural effusions. This probably reflect increased oxygen requirement during early  hospitalization.  WBC at 20. Discussed with ID, CT chest ordered.  Ct chest show large pleural effusion. I have consulted Pulmonary for thoracentesis. Will need order for LDH, cell count, protein and culture at time of thoracentesis. Cx data pending. Anticoagulation will need to be on hold.   2-History of CHF - last EF measured was in April 2013 was 55-60%.  Cardio consulted. BNP on admission at 10,383. Troponin times 3 negative. ECHO 1-21 Ef 55 %. Defer to cardio lasix doses. Negative 2.2 L.    3-Paroxysmal atrial fibrillation presently rate controlled - Cardizem.  hold aspirin patient on lovenox.  4-Hyperglycemia - hemoglobin A1c 6.7.  5-Pleuritic type of chest pain - probably from pneumonia. Resolved.  6-Hyperthyroidism -  Continue methimazole. TSH 0.9 7-Hypokalemia: resolved.  8-E coli UTI; on Zosyn.  9-Right  popliteal vein and right posterial tibial vein: on Lovenox and coumadin.   Code Status: DNR/DNI Family Communication: Discussed with 2 daughters and granddaughter at bedside. Disposition Plan: SNF once medically stable for DC   Consultants:  Cardiology  Palliative  Procedures:  none  Antibiotics:  Ceftriaxone 1-21---122  Azithromycin 1-21--1-26  Zosyn 1-22---  Vancomycin 1-24----1-27  HPI/Subjective: Breathing better. Cough is not worse. No chest pain. She had a BM.   Objective: Filed Vitals:   03/05/13 0524  BP: 135/63  Pulse: 67  Temp: 98.3 F (36.8 C)  Resp: 20   No intake or output data in the 24 hours ending 03/05/13 1113 Filed Weights   02/26/13 0043 03/04/13 1100 03/05/13 1100  Weight: 68.357 kg (150 lb 11.2 oz) 73.12 kg (161 lb 3.2 oz) 73.029 kg (161 lb)    Exam:   General:  Alert, awake. In not acute distress.   Cardiovascular: S 1, S 2 RRR  Respiratory: Bilateral ronchus, no wheezes.   Abdomen: BS present, soft, NT  Musculoskeletal: trace edema.   Data Reviewed: Basic Metabolic Panel:  Recent Labs Lab 03/01/13 0524 03/02/13 0430 03/03/13 0432 03/04/13 0530 03/05/13 0453  NA 142 139 143 140 141  K 3.8 3.4* 4.8 4.4 4.0  CL 101 99 102 100 103  CO2 25 25 26 24 22   GLUCOSE 126* 123* 117* 106* 105*  BUN 29* 26* 23 21 22   CREATININE 0.99 0.93 0.94 0.89 0.86  CALCIUM 8.7  8.4 8.7 8.8 8.6   Liver Function Tests:  Recent Labs Lab 03/04/13 1639  PROT 5.6*   No results found for this basename: LIPASE, AMYLASE,  in the last 168 hours No results found for this basename: AMMONIA,  in the last 168 hours CBC:  Recent Labs Lab 03/01/13 0524 03/02/13 0430 03/03/13 0432  03/04/13 0530 03/05/13 0453  WBC 22.3* 20.0* 20.4* 20.1* 16.3*  NEUTROABS  --   --   --  17.0*  --   HGB 10.2* 9.8* 9.9* 10.4* 11.2*  HCT 29.5* 29.3* 29.5* 31.7* 32.8*  MCV 82.2 83.0 84.3 84.8 84.8  PLT 185 223 272 362 344   Cardiac Enzymes:  Recent Labs Lab 02/26/13 1715 02/26/13 2200  TROPONINI <0.30 <0.30   BNP (last 3 results)  Recent Labs  02/25/13 2055  PROBNP 10383.0*   CBG: No results found for this basename: GLUCAP,  in the last 168 hours  Recent Results (from the past 240 hour(s))  URINE CULTURE     Status: None   Collection Time    02/25/13 10:43 PM      Result Value Range Status   Specimen Description URINE, CLEAN CATCH   Final   Special Requests NONE   Final   Culture  Setup Time     Final   Value: 02/26/2013 10:41     Performed at [facility]   Colony Count     Final   Value: >=100,000 COLONIES/ML     Performed at [facility]   Culture     Final   Value: ESCHERICHIA COLI     Performed at [facility]   Report Status 02/28/2013 FINAL   Final   Organism ID, Bacteria ESCHERICHIA COLI   Final  BODY FLUID CULTURE     Status: None   Collection Time    03/04/13  4:06 PM      Result Value Range Status   Specimen Description PLEURAL FLUID RIGHT   Final   Special Requests Normal   Final   Gram Stain     Final   Value: RARE WBC PRESENT, PREDOMINANTLY PMN     NO ORGANISMS SEEN     Performed at [facility]   Culture     Final   Value: NO GROWTH     Performed at [facility]   Report Status PENDING   Incomplete  FUNGUS CULTURE W SMEAR     Status: None   Collection Time    03/04/13  4:06 PM      Result Value Range Status   Specimen Description PLEURAL FLUID RIGHT   Final   Special Requests NONE   Final   Fungal Smear     Final   Value: NO YEAST OR FUNGAL ELEMENTS SEEN     Performed at [facility]   Culture     Final   Value: CULTURE IN PROGRESS FOR FOUR WEEKS     Performed at [facility]    Report Status PENDING   Incomplete     Studies: Ct Chest Wo Contrast  03/04/2013   CLINICAL DATA:  Evaluate pneumonia.  Persistent leukocytosis.  EXAM: CT CHEST WITHOUT CONTRAST  TECHNIQUE: Multidetector CT imaging of the chest was performed following the standard protocol without IV contrast.  COMPARISON:  03/01/2013  FINDINGS: There is moderate cardiac enlargement. No pericardial effusion noted. Calcified atherosclerotic disease affects the thoracic aorta. Prominent right paratracheal and sub- carinal lymph nodes are  noted without evidence for adenopathy. The trachea appears patent and is midline. Patulous and mildly dilated esophagus is noted. Enteric contrast material is identified within the distal portion of the esophagus.  There is no axillary or supraclavicular adenopathy. Large nodule arising from the inferior pole of the right lobe of thyroid gland measures 4.3 cm.  There is a moderate to large right pleural effusion. Small left effusion is noted. Airspace consolidation involving the right middle lobe and right lower lobe identified. There are filling defects within the posterior basal segments of the right lower lobe bronchus which may reflect mucus plugging. There is mild atelectasis and consolidation involving the posterior medial left lower lobe. Patchy airspace consolidation and ground-glass attenuation is noted in the right upper lobe. There are patchy areas of ground-glass attenuation within the left upper lobe.  Incidental imaging through the upper abdomen shows no acute findings.  Review of the visualized osseous structures is significant for scoliosis and mild multi level spondylosis. No aggressive lytic or sclerotic bone lesions identified.  IMPRESSION: 1. Moderate to large right pleural effusion. A smaller left effusion is present. 2. Dense airspace consolidation involves the right middle lobe as well as the right lower lobe. Patchy areas of airspace in ground-glass attenuation  consolidation within the both upper lobes. 3. Large nodule is noted arising from the inferior pole of the right lobe of the thyroid gland. Consider further evaluation with thyroid ultrasound. If patient is clinically hyperthyroid, consider nuclear medicine thyroid uptake and scan.   Electronically Signed   By: Signa Kell M.D.   On: 03/04/2013 14:41   Dg Chest Port 1 View  03/04/2013   CLINICAL DATA:  Status post thoracentesis  EXAM: PORTABLE CHEST - 1 VIEW  COMPARISON:  Prior chest x-ray 03/01/2013; CT chest 03/04/2013  FINDINGS: No evidence of a pneumothorax. Persistent large right and small to moderate left layering pleural effusions with associated bibasilar opacities. Background patchy airspace opacity in the bilateral upper lungs. Stable cardiomegaly and atherosclerotic and tortuous thoracic aorta. No acute osseous abnormality.  IMPRESSION: 1. No evidence of pneumothorax. 2. Persistent large right and small to moderate left pleural effusions with associated bibasilar atelectasis versus infiltrate 3. Stable to slightly improved patchy opacities in the bilateral upper lungs.   Electronically Signed   By: Malachy Moan M.D.   On: 03/04/2013 18:04    Scheduled Meds: . aspirin  325 mg Oral Daily  . calcium-vitamin D  1 tablet Oral BID WC  . cefTRIAXone (ROCEPHIN)  IV  1 g Intravenous Q24H  . diltiazem  120 mg Oral Daily  . guaifenesin  200 mg Oral Q4H  . labetalol  200 mg Oral BID  . methimazole  5 mg Oral Daily  . multivitamin-lutein  1 capsule Oral Daily  . omega-3 acid ethyl esters  1 capsule Oral BID  . polyethylene glycol  17 g Oral Daily  . sodium chloride  3 mL Intravenous Q12H  . Warfarin - Pharmacist Dosing Inpatient   Does not apply q1800   Continuous Infusions:   Principal Problem:   Community acquired pneumonia Active Problems:   HYPERTHYROIDISM   HYPERTENSION   ATRIAL FIBRILLATION, PAROXYSMAL   Acute on chronic diastolic congestive heart failure, NYHA class 4   Acute  respiratory failure   DNR (do not resuscitate)   DVT (deep venous thrombosis)   Pleural effusion    Time spent: 35 minutes.     [facility]  [facility] Pager [phone]. If 7PM-7AM, please contact night-coverage  at [email or web address], password [email or web address] 03/05/2013, 11:13 AM  LOS: 8 days

## 2013-03-05 NOTE — Progress Notes (Signed)
     SUBJECTIVE: NO complaints this am.   BP 135/63  Pulse 67  Temp(Src) 98.3 F (36.8 C) (Oral)  Resp 20  Ht 5\' 6"  (1.676 m)  Wt 161 lb 3.2 oz (73.12 kg)  BMI 26.03 kg/m2  SpO2 93% No intake or output data in the 24 hours ending 03/05/13 1018  PHYSICAL EXAM General: Well developed, well nourished, in no acute distress. Alert and oriented x 3.  Psych: Good affect, responds appropriately  Neck: No JVD. No masses noted.  Lungs: Clear bilaterally with no wheezes or rhonci noted.  Heart: Irreg irreg with no murmurs noted.  Abdomen: Bowel sounds are present. Soft, non-tender.  Extremities: No lower extremity edema.   LABS: Basic Metabolic Panel:  Recent Labs  69/62/9501/27/15 0530 03/05/13 0453  NA 140 141  K 4.4 4.0  CL 100 103  CO2 24 22  GLUCOSE 106* 105*  BUN 21 22  CREATININE 0.89 0.86  CALCIUM 8.8 8.6   CBC:  Recent Labs  03/04/13 0530 03/05/13 0453  WBC 20.1* 16.3*  NEUTROABS 17.0*  --   HGB 10.4* 11.2*  HCT 31.7* 32.8*  MCV 84.8 84.8  PLT 362 344   Fasting Lipid Panel:  Recent Labs  03/04/13 1639  CHOL 119    Current Meds: . aspirin  325 mg Oral Daily  . calcium-vitamin D  1 tablet Oral BID WC  . cefTRIAXone (ROCEPHIN)  IV  1 g Intravenous Q24H  . diltiazem  120 mg Oral Daily  . guaifenesin  200 mg Oral Q4H  . labetalol  200 mg Oral BID  . methimazole  5 mg Oral Daily  . multivitamin-lutein  1 capsule Oral Daily  . omega-3 acid ethyl esters  1 capsule Oral BID  . polyethylene glycol  17 g Oral Daily  . sodium chloride  3 mL Intravenous Q12H  . Warfarin - Pharmacist Dosing Inpatient   Does not apply q1800     ASSESSMENT AND PLAN:   1. Atrial fibrillation: Rate controlled on Cardizem CD 120 mg Qdaily. Coumadin has been started. I think this is reasonable given DVT and atrial fib. Pt and family understands potential bleeding risk. Given advanced age she is at a higher risk of bleeding on any agent. INR 1.2 this am.   2. DVT: Currently on  Lovenox with overlap with coumadin.   3. Pneumonia: Per primary team.   4. Acute on chronic diastolic CHF: LV EF 60-65%. Volume is ok. I/O even over last 48 hours.   5. HTN: BP controlled.  6. Right pleural effusion: He has been seen by Pulmonary. Pt s/p diagnostic tap yesterday. May need drain.    Danielle Adams  1/28/201510:18 AM

## 2013-03-05 NOTE — Progress Notes (Signed)
ANTICOAGULATION CONSULT NOTE - Follow Up Consult  Pharmacy Consult for Lovenox and Coumadin Indication: DVT  No Known Allergies  Patient Measurements: Height: 5\' 6"  (167.6 cm) Weight: 161 lb (73.029 kg) IBW/kg (Calculated) : 59.3  Vital Signs: Temp: 97.2 F (36.2 C) (01/28 1551) Temp src: Oral (01/28 1551) BP: 126/67 mmHg (01/28 1551) Pulse Rate: 65 (01/28 1551)  Labs:  Recent Labs  03/03/13 0432 03/03/13 1300 03/04/13 0530 03/05/13 0453  HGB 9.9*  --  10.4* 11.2*  HCT 29.5*  --  31.7* 32.8*  PLT 272  --  362 344  LABPROT  --  16.5* 14.6 14.8  INR  --  1.37 1.16 1.19  CREATININE 0.94  --  0.89 0.86    Estimated Creatinine Clearance: 40 ml/min (by C-G formula based on Cr of 0.86).   Assessment: 80 y/o female to resume Lovenox for new DVT. Pharmacy also consulted to dose Coumadin but this is being held for now in case a drain for pleural effusion needs to be placed - confirmed with Dr. Ardyth HarpsHernandez. No bleeding noted, CBC is stable.  Goal of Therapy:  INR 2-3 Anti-Xa level 0.6-1 units/ml 4hrs after LMWH dose given Monitor platelets by anticoagulation protocol: Yes   Plan:  -Hold Coumadin tonight pending a future decision on drain placement -Lovenox 70 mg SQ q12h -INR daily, CBC q72h while on Lovenox  [facility] Surgery CenterJennifer La Follette, RochellePharm.D., BCPS Clinical Pharmacist Pager: [phone] 03/05/2013 4:13 PM

## 2013-03-05 NOTE — Evaluation (Signed)
Occupational Therapy Evaluation Patient Details Name: Danielle Adams MRN: [id number] DOB: Aug 22, 1916 Today's Date: 03/05/2013 Time: [id number] OT Time Calculation (min): 27 min  OT Assessment / Plan / Recommendation History of present illness 80 y.o. female [facility] with history of hypertension, hypothyroidism, paroxysmal atrial fibrillation, CHF was brought to the ER after patient was having increasing shortness of breath over the last one week. Patient was also noticed to be having increasing productive cough. Patient also has pleuritic type of chest pain on the right side. In the ER patient was noted to have elevated white cell count with elevated BNP and chest x-ray features concerning for pneumonia.  Pt s/p R thoracentesis on 03/04/13.     Clinical Impression   Pt was admitted for the above.  She was active prior to admission and is limited at time of eval by decreased endurance, decreased balance, generalized decreased strength and she currently has a pleural effusion.  Pt will benefit from skilled OT to increase safety and independence with adls.  Goals in acute are for overall min A    OT Assessment  Patient needs continued OT Services    Follow Up Recommendations  SNF;Supervision/Assistance - 24 hour    Barriers to Discharge      Equipment Recommendations  3 in 1 bedside comode    Recommendations for Other Services    Frequency  Min 2X/week    Precautions / Restrictions Precautions Precautions: Fall;Other (comment) Precaution Comments: Monitor O2 sats Restrictions Weight Bearing Restrictions: No   Pertinent Vitals/Pain No c/o pain.  Pt in AFib, VSS    ADL  Eating/Feeding: Independent Where Assessed - Eating/Feeding: Bed level Grooming: Set up Where Assessed - Grooming: Supported sitting Upper Body Bathing: Set up Where Assessed - Upper Body Bathing: Supported sitting Lower Body Bathing: Moderate assistance Where Assessed - Lower Body Bathing:  Supported sit to stand Upper Body Dressing: Minimal assistance (iv) Where Assessed - Upper Body Dressing: Unsupported sitting Lower Body Dressing: Moderate assistance Where Assessed - Lower Body Dressing: Supported sit to stand Toileting - ArchitectClothing Manipulation and Hygiene: +1 Total assistance Where Assessed - Toileting Clothing Manipulation and Hygiene: Sit to stand from 3-in-1 or toilet;Rolling right and/or left Equipment Used: Rolling walker Transfers/Ambulation Related to ADLs: pt was incontinent x bowel and catheter was leaking.  rolled and stood momentarily to help her clean up.  Pt stood about 10 seconds before she had to sit.  assistance to control descent ADL Comments: pt limited by endurance.  changed gown and helped her get cleaned up during this OT session    OT Diagnosis: Generalized weakness  OT Problem List: Decreased strength;Decreased activity tolerance;Impaired balance (sitting and/or standing);Decreased knowledge of use of DME or AE;Pain OT Treatment Interventions: Self-care/ADL training;Energy conservation;Patient/family education;Balance training;Therapeutic activities   OT Goals(Current goals can be found in the care plan section) Acute Rehab OT Goals Patient Stated Goal: to get stronger and go home OT Goal Formulation: With patient Time For Goal Achievement: 03/19/13 Potential to Achieve Goals: Good ADL Goals Pt Will Transfer to Toilet: with min assist;bedside commode;stand pivot transfer Additional ADL Goal #1: pt will complete ub adls at eob with set up Additional ADL Goal #2: pt will complete LB adls with min A, sit to stand, with AE as needed for energy conservation  Visit Information  Last OT Received On: 03/05/13 Assistance Needed: +2 History of Present Illness: 80 y.o. female admitted to [facility] on 02/25/13 with history of hypertension, hypothyroidism, paroxysmal atrial fibrillation, CHF  was brought to the ER after patient was having increasing shortness of  breath over the last one week. Patient was also noticed to be having increasing productive cough. Patient also has pleuritic type of chest pain on the right side. In the ER patient was noted to have elevated white cell count with elevated BNP and chest x-ray features concerning for pneumonia.  Pt s/p R thoracentesis on 03/04/13.         Prior Functioning     Home Living Family/patient expects to be discharged to:: Unsure Additional Comments: pt was at home with daughter:  went to gym 3x week to ride bike Prior Function Level of Independence: Independent with assistive device(s) Communication Communication: HOH Dominant Hand: Right         Vision/Perception     Cognition  Cognition Arousal/Alertness: Awake/alert Behavior During Therapy: WFL for tasks assessed/performed Overall Cognitive Status: Within Functional Limits for tasks assessed    Extremity/Trunk Assessment Upper Extremity Assessment Upper Extremity Assessment: Overall WFL for tasks assessed     Mobility Bed Mobility Overal bed mobility: Needs Assistance Bed Mobility: Supine to Sit;Rolling;Sit to Supine Rolling: Modified independent (Device/Increase time) (with rails) Supine to sit: Mod assist;HOB elevated Sit to supine: Mod assist General bed mobility comments: assist for legs and trunk Transfers Transfers: Sit to/from Stand Sit to Stand: Mod assist General transfer comment: assist to rise, get over BOS and lower in a controlled mannner     Exercise     Balance Balance Sitting-balance support: Bilateral upper extremity supported Sitting balance-Leahy Scale: Poor Postural control: Posterior lean Standing balance support: Bilateral upper extremity supported Standing balance-Leahy Scale: Zero General Comments General comments (skin integrity, edema, etc.): Pt had leaking catheter:  NT assisted with cleaning pt up and RN alerted.     End of Session OT - End of Session Activity Tolerance: Patient limited  by fatigue Patient left: in bed;with call bell/phone within reach;with bed alarm set Nurse Communication:  (leaking catheter)  GO     Danielle Adams 03/05/2013, 4:04 PM Marica Otter, OTR/L [phone] 03/05/2013

## 2013-03-05 NOTE — Progress Notes (Signed)
Name: Nanetta Wiegman MRN: [id number] DOB: October 31, 1916    ADMISSION DATE:  02/25/2013 CONSULTATION DATE:  03-12-2013  REFERRING MD :  Carmell Austria  PRIMARY SERVICE:  TRH  CHIEF COMPLAINT:  Pleural effusion  BRIEF PATIENT DESCRIPTION: 80 year old female with history of PAF and HTN admitted to [facility] 1/21 with CAP. After failing traditional CAP therapies a chest CT was obtained which showed a R pleural effusion. PCCM has been asked to see.  SIGNIFICANT EVENTS / STUDIES:  1/21 - Admitted to [facility] 03-12-2022 - CT Chest  > R pleural effusion  LINES / TUBES:   CULTURES: 1/20 - Urine > E. Coli: Pan sensitive. 1/21 - Strep Pneumo Urinary Antigen > Positive 1/28  Pleural fluid>>>  ANTIBIOTICS: Rocephin 2022/03/12 >>> Zosyn 1/24 2022/03/12 Vanc 124 03-12-2022  SUBJECTIVE:  No c/o.  WBC trending down.  Denies SOB.   VITAL SIGNS: Temp:  [97.9 F (36.6 C)-98.3 F (36.8 C)] 98.3 F (36.8 C) (01/28 0524) Pulse Rate:  [59-75] 67 (01/28 0524) Resp:  [19-20] 20 (01/28 0524) BP: (123-135)/(41-63) 135/63 mmHg (01/28 0524) SpO2:  [89 %-96 %] 93 % (01/28 0524) Weight:  [161 lb (73.029 kg)] 161 lb (73.029 kg) (01/28 1100)  PHYSICAL EXAMINATION: General:  80 year old female or normal body habitus Neuro:  Awake, alert, oriented HEENT:  [place], PERRL Neck:  Supple, no jvd Cardiovascular: RRR Lungs: Diminished R >L Abdomen:  Soft, non-tender, non- distended Musculoskeletal:  Intact Skin:  Intact   Recent Labs Lab 03/03/13 0432 2013-03-12 0530 03/05/13 0453  NA 143 140 141  K 4.8 4.4 4.0  CL 102 100 103  CO2 26 24 22   BUN 23 21 22   CREATININE 0.94 0.89 0.86  GLUCOSE 117* 106* 105*    Recent Labs Lab 03/03/13 0432 03-12-13 0530 03/05/13 0453  HGB 9.9* 10.4* 11.2*  HCT 29.5* 31.7* 32.8*  WBC 20.4* 20.1* 16.3*  PLT 272 362 344   Ct Chest Wo Contrast  2013/03/12   CLINICAL DATA:  Evaluate pneumonia.  Persistent leukocytosis.  EXAM: CT CHEST WITHOUT CONTRAST  TECHNIQUE: Multidetector CT imaging of the chest  was performed following the standard protocol without IV contrast.  COMPARISON:  03/01/2013  FINDINGS: There is moderate cardiac enlargement. No pericardial effusion noted. Calcified atherosclerotic disease affects the thoracic aorta. Prominent right paratracheal and sub- carinal lymph nodes are noted without evidence for adenopathy. The trachea appears patent and is midline. Patulous and mildly dilated esophagus is noted. Enteric contrast material is identified within the distal portion of the esophagus.  There is no axillary or supraclavicular adenopathy. Large nodule arising from the inferior pole of the right lobe of thyroid gland measures 4.3 cm.  There is a moderate to large right pleural effusion. Small left effusion is noted. Airspace consolidation involving the right middle lobe and right lower lobe identified. There are filling defects within the posterior basal segments of the right lower lobe bronchus which may reflect mucus plugging. There is mild atelectasis and consolidation involving the posterior medial left lower lobe. Patchy airspace consolidation and ground-glass attenuation is noted in the right upper lobe. There are patchy areas of ground-glass attenuation within the left upper lobe.  Incidental imaging through the upper abdomen shows no acute findings.  Review of the visualized osseous structures is significant for scoliosis and mild multi level spondylosis. No aggressive lytic or sclerotic bone lesions identified.  IMPRESSION: 1. Moderate to large right pleural effusion. A smaller left effusion is present. 2. Dense airspace consolidation involves the  right middle lobe as well as the right lower lobe. Patchy areas of airspace in ground-glass attenuation consolidation within the both upper lobes. 3. Large nodule is noted arising from the inferior pole of the right lobe of the thyroid gland. Consider further evaluation with thyroid ultrasound. If patient is clinically hyperthyroid, consider  nuclear medicine thyroid uptake and scan.   Electronically Signed   By: Signa Kellaylor  Stroud M.D.   On: 03/04/2013 14:41   Dg Chest Port 1 View  03/04/2013   CLINICAL DATA:  Status post thoracentesis  EXAM: PORTABLE CHEST - 1 VIEW  COMPARISON:  Prior chest x-ray 03/01/2013; CT chest 03/04/2013  FINDINGS: No evidence of a pneumothorax. Persistent large right and small to moderate left layering pleural effusions with associated bibasilar opacities. Background patchy airspace opacity in the bilateral upper lungs. Stable cardiomegaly and atherosclerotic and tortuous thoracic aorta. No acute osseous abnormality.  IMPRESSION: 1. No evidence of pneumothorax. 2. Persistent large right and small to moderate left pleural effusions with associated bibasilar atelectasis versus infiltrate 3. Stable to slightly improved patchy opacities in the bilateral upper lungs.   Electronically Signed   By: Malachy MoanHeath  McCullough M.D.   On: 03/04/2013 18:04    ASSESSMENT / PLAN:  CAP (Strep Pneumo) Right parapneumonic Pleural Effusion.  Exudative. Discussed with family. The only certain indication for tube drainage would be documented empyema. Relative indications would be for dyspnea, pain, to avoid complicated effusion, etc.   PLAN -  - would defer chest tube drainage, ether large bore or catheter, until we have more mature cx data - cont f/u fluid results/path/cultures - Continue ceftriaxone. - f/u CXR    WHITEHEART,KATHRYN, NP 03/05/2013  11:59 AM Pager: [phone] or [phone]  *Care during the described time interval was provided by me and/or other providers on the critical care team. I have reviewed this patient's available data, including medical history, events of note, physical examination and test results as part of my evaluation.  Levy Pupaobert Najiyah Paris, MD, PhD 03/05/2013, 12:39 PM [facility] [phone] or if no answer [phone]

## 2013-03-06 ENCOUNTER — Encounter: Payer: Self-pay | Admitting: Geriatric Medicine

## 2013-03-06 DIAGNOSIS — J90 Pleural effusion, not elsewhere classified: Secondary | ICD-10-CM | POA: Diagnosis not present

## 2013-03-06 DIAGNOSIS — I5033 Acute on chronic diastolic (congestive) heart failure: Secondary | ICD-10-CM | POA: Diagnosis not present

## 2013-03-06 DIAGNOSIS — I509 Heart failure, unspecified: Secondary | ICD-10-CM | POA: Diagnosis not present

## 2013-03-06 DIAGNOSIS — J189 Pneumonia, unspecified organism: Secondary | ICD-10-CM | POA: Diagnosis not present

## 2013-03-06 DIAGNOSIS — J154 Pneumonia due to other streptococci: Secondary | ICD-10-CM | POA: Diagnosis not present

## 2013-03-06 DIAGNOSIS — I4891 Unspecified atrial fibrillation: Secondary | ICD-10-CM | POA: Diagnosis not present

## 2013-03-06 DIAGNOSIS — J9600 Acute respiratory failure, unspecified whether with hypoxia or hypercapnia: Secondary | ICD-10-CM | POA: Diagnosis not present

## 2013-03-06 LAB — PROTIME-INR
INR: 1.17 (ref 0.00–1.49)
Prothrombin Time: 14.7 seconds (ref 11.6–15.2)

## 2013-03-06 MED ORDER — WARFARIN SODIUM 4 MG PO TABS
4.0000 mg | ORAL_TABLET | Freq: Once | ORAL | Status: AC
  Administered 2013-03-06: 4 mg via ORAL
  Filled 2013-03-06: qty 1

## 2013-03-06 NOTE — Discharge Instructions (Signed)

## 2013-03-06 NOTE — Progress Notes (Signed)
[facility] PROGRESS NOTE  Danielle Adams ZOX:[id number] DOB: 06-12-1916 DOA: 02/25/2013 PCP: Illene Regulus, MD  Assessment/Plan: 80 year old who was admitted with PNA, hypoxemia, Diastolic Heart Failure. Her respiratory status got worse within 24-48 hour of admission. She was requiring NRB. Her antibiotics has been change over course of hospitalization. She received IV lasix to treat for heart failure. Patient respiratory status has stabilize. She is now on % L of oxygen. Her WBC count continue to be elevated. Discussed case with ID. CT chest ordered. CT show right side pleural effusion. I have consulted pulmonary for thoracentesis. Patient was also diagnosed with DVT. She was started on coumadin/Lovenox.   1-Acute Respiratory Failure: Secondary to PNA and Heart failure exacerbation.  Continue with IV antibiotics day 7. ceftriaxone was change to Zosyn for anaerobes coverage on 1-22. Vancomycin 1/24---1/27. Received 5 days of Azithromycin. Rocephin has been restarted per ID recommendations. Strep Pneumonia urine Ag positive. Discussed with family, no BIPAP.  on 5 L NS.  Nebulizer treatments PRN.Influenza negative.  Chest x ray 1-24: Increased confluent infiltrate in right lung base, mainly involving the right middle lobe. Increased small bilateral pleural effusions. This probably reflect increased oxygen requirement during early  hospitalization.  WBC at 20. Discussed with ID, CT chest ordered.  Ct chest show large pleural effusion. I have consulted Pulmonary for thoracentesis. Will need order for LDH, cell count, protein and culture at time of thoracentesis. Cx data pending. Anticoagulation will need to be on hold.   2-History of CHF - last EF measured was in April 2013 was 55-60%.  Cardio consulted. BNP on admission at 10,383. Troponin times 3 negative. ECHO 1-21 Ef 55 %. Defer to cardio lasix doses. Negative 2.2 L.    3-Paroxysmal atrial fibrillation presently rate controlled - Cardizem.  hold aspirin patient on lovenox.  4-Hyperglycemia - hemoglobin A1c 6.7.  5-Pleuritic type of chest pain - probably from pneumonia. Resolved.  6-Hyperthyroidism -  Continue methimazole. TSH 0.9 7-Hypokalemia: resolved.  8-E coli UTI; on Zosyn.  9-Right  popliteal vein and right posterial tibial vein: on Lovenox and coumadin.   Code Status: DNR/DNI Family Communication: Discussed with 2 daughters and granddaughter at bedside. Disposition Plan: SNF likely in 1-2 days.   Consultants:  Cardiology  Palliative  Procedures:  none  Antibiotics:  Ceftriaxone 1-21---122  Azithromycin 1-21--1-26  Zosyn 1-22---  Vancomycin 1-24----1-27  HPI/Subjective: Breathing better. Cough is not worse. No chest pain. She had a BM.   Objective: Filed Vitals:   03/06/13 1235  BP:   Pulse: 74  Temp: 97.5 F (36.4 C)  Resp: 18    Intake/Output Summary (Last 24 hours) at 03/06/13 1717 Last data filed at 03/06/13 1400  Gross per 24 hour  Intake      0 ml  Output    200 ml  Net   -200 ml   Filed Weights   03/04/13 1100 03/05/13 1100 03/06/13 0605  Weight: 73.12 kg (161 lb 3.2 oz) 73.029 kg (161 lb) 76.295 kg (168 lb 3.2 oz)    Exam:   General:  Alert, awake. In not acute distress.   Cardiovascular: S 1, S 2 RRR  Respiratory: Bilateral ronchus, no wheezes.   Abdomen: BS present, soft, NT  Musculoskeletal: trace edema.   Data Reviewed: Basic Metabolic Panel:  Recent Labs Lab 03/01/13 0524 03/02/13 0430 03/03/13 0432 03/04/13 0530 03/05/13 0453  NA 142 139 143 140 141  K 3.8 3.4* 4.8 4.4 4.0  CL 101 99 102 100 103  CO2 25 25 26 24 22   GLUCOSE 126* 123* 117* 106* 105*  BUN 29* 26* 23 21 22   CREATININE 0.99 0.93 0.94 0.89 0.86  CALCIUM 8.7 8.4 8.7 8.8 8.6   Liver Function Tests:  Recent Labs Lab 03/04/13 1639  PROT 5.6*   No results found for this basename: LIPASE, AMYLASE,  in the last 168 hours No results found for this basename: AMMONIA,  in the last 168  hours CBC:  Recent Labs Lab 03/01/13 0524 03/02/13 0430 03/03/13 0432 03/04/13 0530 03/05/13 0453  WBC 22.3* 20.0* 20.4* 20.1* 16.3*  NEUTROABS  --   --   --  17.0*  --   HGB 10.2* 9.8* 9.9* 10.4* 11.2*  HCT 29.5* 29.3* 29.5* 31.7* 32.8*  MCV 82.2 83.0 84.3 84.8 84.8  PLT 185 223 272 362 344   Cardiac Enzymes: No results found for this basename: CKTOTAL, CKMB, CKMBINDEX, TROPONINI,  in the last 168 hours BNP (last 3 results)  Recent Labs  02/25/13 2055  PROBNP 10383.0*   CBG: No results found for this basename: GLUCAP,  in the last 168 hours  Recent Results (from the past 240 hour(s))  URINE CULTURE     Status: None   Collection Time    02/25/13 10:43 PM      Result Value Range Status   Specimen Description URINE, CLEAN CATCH   Final   Special Requests NONE   Final   Culture  Setup Time     Final   Value: 02/26/2013 10:41     Performed at [facility]   Colony Count     Final   Value: >=100,000 COLONIES/ML     Performed at [facility]   Culture     Final   Value: ESCHERICHIA COLI     Performed at [facility]   Report Status 02/28/2013 FINAL   Final   Organism ID, Bacteria ESCHERICHIA COLI   Final  AFB CULTURE WITH SMEAR     Status: None   Collection Time    03/04/13  4:06 PM      Result Value Range Status   Specimen Description PLEURAL FLUID RIGHT   Final   Special Requests Normal   Final   ACID FAST SMEAR     Final   Value: NO ACID FAST BACILLI SEEN     Performed at [facility]   Culture     Final   Value: CULTURE WILL BE EXAMINED FOR 6 WEEKS BEFORE ISSUING A FINAL REPORT     Performed at [facility]   Report Status PENDING   Incomplete  BODY FLUID CULTURE     Status: None   Collection Time    03/04/13  4:06 PM      Result Value Range Status   Specimen Description PLEURAL FLUID RIGHT   Final   Special Requests Normal   Final   Gram Stain     Final   Value: RARE WBC PRESENT, PREDOMINANTLY PMN     NO  ORGANISMS SEEN     Performed at [facility]   Culture     Final   Value: NO GROWTH 2 DAYS     Performed at [facility]   Report Status PENDING   Incomplete  FUNGUS CULTURE W SMEAR     Status: None   Collection Time    03/04/13  4:06 PM      Result Value Range Status   Specimen Description  PLEURAL FLUID RIGHT   Final   Special Requests NONE   Final   Fungal Smear     Final   Value: NO YEAST OR FUNGAL ELEMENTS SEEN     Performed at [facility]   Culture     Final   Value: CULTURE IN PROGRESS FOR FOUR WEEKS     Performed at [facility]   Report Status PENDING   Incomplete     Studies: Dg Chest Port 1 View  03/04/2013   CLINICAL DATA:  Status post thoracentesis  EXAM: PORTABLE CHEST - 1 VIEW  COMPARISON:  Prior chest x-ray 03/01/2013; CT chest 03/04/2013  FINDINGS: No evidence of a pneumothorax. Persistent large right and small to moderate left layering pleural effusions with associated bibasilar opacities. Background patchy airspace opacity in the bilateral upper lungs. Stable cardiomegaly and atherosclerotic and tortuous thoracic aorta. No acute osseous abnormality.  IMPRESSION: 1. No evidence of pneumothorax. 2. Persistent large right and small to moderate left pleural effusions with associated bibasilar atelectasis versus infiltrate 3. Stable to slightly improved patchy opacities in the bilateral upper lungs.   Electronically Signed   By: Malachy Moan M.D.   On: 03/04/2013 18:04    Scheduled Meds: . aspirin  325 mg Oral Daily  . calcium-vitamin D  1 tablet Oral BID WC  . cefTRIAXone (ROCEPHIN)  IV  1 g Intravenous Q24H  . diltiazem  120 mg Oral Daily  . enoxaparin (LOVENOX) injection  70 mg Subcutaneous Q12H  . guaifenesin  200 mg Oral Q4H  . labetalol  200 mg Oral BID  . methimazole  5 mg Oral Daily  . multivitamin-lutein  1 capsule Oral Daily  . omega-3 acid ethyl esters  1 capsule Oral BID  . polyethylene glycol  17 g Oral Daily  .  sodium chloride  3 mL Intravenous Q12H  . warfarin  4 mg Oral ONCE-1800  . Warfarin - Pharmacist Dosing Inpatient   Does not apply q1800   Continuous Infusions:   Principal Problem:   Community acquired pneumonia Active Problems:   HYPERTHYROIDISM   HYPERTENSION   ATRIAL FIBRILLATION, PAROXYSMAL   Acute on chronic diastolic congestive heart failure, NYHA class 4   Acute respiratory failure   DNR (do not resuscitate)   DVT (deep venous thrombosis)   Pleural effusion    Time spent: 25 minutes.     [facility]  [facility] Pager [phone]. If 7PM-7AM, please contact night-coverage at [email or web address], password [facility] 03/06/2013, 5:17 PM  LOS: 9 days

## 2013-03-06 NOTE — Progress Notes (Signed)
ANTICOAGULATION CONSULT NOTE - Follow Up Consult  Pharmacy Consult for Lovenox and Coumadin Indication: DVT  No Known Allergies  Patient Measurements: Height: 5\' 6"  (167.6 cm) Weight: 168 lb 3.2 oz (76.295 kg) IBW/kg (Calculated) : 59.3  Vital Signs: Temp: 97.9 F (36.6 C) (01/29 0605) Temp src: Oral (01/29 0605) BP: 131/50 mmHg (01/29 0605) Pulse Rate: 71 (01/29 0605)  Labs:  Recent Labs  03/04/13 0530 03/05/13 0453 03/06/13 0512  HGB 10.4* 11.2*  --   HCT 31.7* 32.8*  --   PLT 362 344  --   LABPROT 14.6 14.8 14.7  INR 1.16 1.19 1.17  CREATININE 0.89 0.86  --     Estimated Creatinine Clearance: 40.8 ml/min (by C-G formula based on Cr of 0.86).   Assessment: 80 y/o female on Lovenox for new DVT, pt also has history of pAFib, was not on anticoagulation prior to admission.  Pharmacy also consulted to dose Coumadin, pt received one dose of warfarin on 1/26, warfarin has been held since then due to potential drain placement for pleural effusion, now drain will not be placed, therefore will resume warfarin. No bleeding is noted, CBC is stable.  Goal of Therapy:  INR 2-3 Anti-Xa level 0.6-1 units/ml 4hrs after LMWH dose given Monitor platelets by anticoagulation protocol: Yes   Plan:  -Warfarin 4 mg po x1 -Lovenox 70 mg SQ q12h -INR daily, CBC q72h while on Lovenox  Agapito GamesAlison Bea Duren, PharmD, BCPS Clinical Pharmacist 03/06/2013 10:05 AM

## 2013-03-06 NOTE — Progress Notes (Addendum)
CSW (Clinical Child psychotherapistocial Worker) continues to update facility. Have left message with facility today to inquire if dc over weekend will be possible. Will update MD once facility responds.   Addendum: Facility can accept pt over the weekend but will need dc paperwork by tomorrow afternoon.   Jamus Loving, LCSWA [phone]

## 2013-03-06 NOTE — Progress Notes (Signed)
[facility]    Date of Admission:  02/25/2013   Total days of antibiotics 10        Day 5 ceftriaxone            ID: Danielle Adams is a 80 y.o. female with strep pneumonia CAP c/b exudate right sided pleural effusion slow to respond Principal Problem:   Community acquired pneumonia Active Problems:   HYPERTHYROIDISM   HYPERTENSION   ATRIAL FIBRILLATION, PAROXYSMAL   Acute on chronic diastolic congestive heart failure, NYHA class 4   Acute respiratory failure   DNR (do not resuscitate)   DVT (deep venous thrombosis)   Pleural effusion    Subjective: afebrile  Medications:  . aspirin  325 mg Oral Daily  . calcium-vitamin D  1 tablet Oral BID WC  . cefTRIAXone (ROCEPHIN)  IV  1 g Intravenous Q24H  . diltiazem  120 mg Oral Daily  . enoxaparin (LOVENOX) injection  70 mg Subcutaneous Q12H  . guaifenesin  200 mg Oral Q4H  . labetalol  200 mg Oral BID  . methimazole  5 mg Oral Daily  . multivitamin-lutein  1 capsule Oral Daily  . omega-3 acid ethyl esters  1 capsule Oral BID  . polyethylene glycol  17 g Oral Daily  . sodium chloride  3 mL Intravenous Q12H  . Warfarin - Pharmacist Dosing Inpatient   Does not apply q1800    Objective: Vital signs in last 24 hours: Temp:  [97.2 F (36.2 C)-97.9 F (36.6 C)] 97.9 F (36.6 C) (01/29 0605) Pulse Rate:  [56-71] 71 (01/29 0605) Resp:  [16-20] 18 (01/29 0605) BP: (126-132)/(48-67) 131/50 mmHg (01/29 0605) SpO2:  [93 %-96 %] 96 % (01/29 0605) Weight:  [161 lb (73.029 kg)-168 lb 3.2 oz (76.295 kg)] 168 lb 3.2 oz (76.295 kg) (01/29 16100605)  Constitutional: oriented to person, place, and time. appears stated age. Hard of hearing. No distress.  HENT:  Mouth/Throat: Oropharynx is clear and moist. No oropharyngeal exudate.  Cardiovascular: Normal rate, regular rhythm and normal heart sounds. Exam reveals no gallop and no friction rub.  No murmur heard.  Pulmonary/Chest: Effort normal and breath sounds  normal.decreased at right base No respiratory distress. no wheezes.  Abdominal: Soft. Bowel sounds are normal. exhibits no distension. There is no tenderness.  Lymphadenopathy: no cervical adenopathy.  Skin: Skin is warm and dry. No rash noted. No erythema.   Lab Results  Recent Labs  03/04/13 0530 03/05/13 0453  WBC 20.1* 16.3*  HGB 10.4* 11.2*  HCT 31.7* 32.8*  NA 140 141  K 4.4 4.0  CL 100 103  CO2 24 22  BUN 21 22  CREATININE 0.89 0.86   Liver Panel  Recent Labs  03/04/13 1639  PROT 5.6*    Microbiology: 1/20 ur cx: ecoli - pan S  Studies/Results: Ct Chest Wo Contrast  03/04/2013   CLINICAL DATA:  Evaluate pneumonia.  Persistent leukocytosis.  EXAM: CT CHEST WITHOUT CONTRAST  TECHNIQUE: Multidetector CT imaging of the chest was performed following the standard protocol without IV contrast.  COMPARISON:  03/01/2013  FINDINGS: There is moderate cardiac enlargement. No pericardial effusion noted. Calcified atherosclerotic disease affects the thoracic aorta. Prominent right paratracheal and sub- carinal lymph nodes are noted without evidence for adenopathy. The trachea appears patent and is midline. Patulous and mildly dilated esophagus is noted. Enteric contrast material is identified within the distal portion of the esophagus.  There is no axillary or supraclavicular adenopathy. Large nodule  arising from the inferior pole of the right lobe of thyroid gland measures 4.3 cm.  There is a moderate to large right pleural effusion. Small left effusion is noted. Airspace consolidation involving the right middle lobe and right lower lobe identified. There are filling defects within the posterior basal segments of the right lower lobe bronchus which may reflect mucus plugging. There is mild atelectasis and consolidation involving the posterior medial left lower lobe. Patchy airspace consolidation and ground-glass attenuation is noted in the right upper lobe. There are patchy areas of  ground-glass attenuation within the left upper lobe.  Incidental imaging through the upper abdomen shows no acute findings.  Review of the visualized osseous structures is significant for scoliosis and mild multi level spondylosis. No aggressive lytic or sclerotic bone lesions identified.  IMPRESSION: 1. Moderate to large right pleural effusion. A smaller left effusion is present. 2. Dense airspace consolidation involves the right middle lobe as well as the right lower lobe. Patchy areas of airspace in ground-glass attenuation consolidation within the both upper lobes. 3. Large nodule is noted arising from the inferior pole of the right lobe of the thyroid gland. Consider further evaluation with thyroid ultrasound. If patient is clinically hyperthyroid, consider nuclear medicine thyroid uptake and scan.   Electronically Signed   By: Signa Kell M.D.   On: 03/04/2013 14:41   Dg Chest Port 1 View  03/04/2013   CLINICAL DATA:  Status post thoracentesis  EXAM: PORTABLE CHEST - 1 VIEW  COMPARISON:  Prior chest x-ray 03/01/2013; CT chest 03/04/2013  FINDINGS: No evidence of a pneumothorax. Persistent large right and small to moderate left layering pleural effusions with associated bibasilar opacities. Background patchy airspace opacity in the bilateral upper lungs. Stable cardiomegaly and atherosclerotic and tortuous thoracic aorta. No acute osseous abnormality.  IMPRESSION: 1. No evidence of pneumothorax. 2. Persistent large right and small to moderate left pleural effusions with associated bibasilar atelectasis versus infiltrate 3. Stable to slightly improved patchy opacities in the bilateral upper lungs.   Electronically Signed   By: Malachy Moan M.D.   On: 03/04/2013 18:04     Assessment/Plan: 80yo F with strep pneumonia with right-sided exudative effusion   -  continue on ceftriaxone 1gm IV daily, currently on day 10. Would treat for a total of 14 days as a complicated pneumonia. Can switch to oral  regimen of cefuroxime when ready for discharge to finish course.  Will sign off    [facility] Cell: [phone] Pager: [phone]  03/06/2013, 9:49 AM

## 2013-03-06 NOTE — Progress Notes (Signed)
Physical Therapy Treatment Patient Details Name: Danielle Adams MRN: [id number] DOB: 08/18/1916 Today's Date: 03/06/2013 Time: [id number] PT Time Calculation (min): 18 min  PT Assessment / Plan / Recommendation  History of Present Illness 80 y.o. female [facility] with history of hypertension, hypothyroidism, paroxysmal atrial fibrillation, CHF was brought to the ER after patient was having increasing shortness of breath over the last one week. Patient was also noticed to be having increasing productive cough. Patient also has pleuritic type of chest pain on the right side. In the ER patient was noted to have elevated white cell count with elevated BNP and chest x-ray features concerning for pneumonia.  Pt s/p R thoracentesis on 03/04/13.     PT Comments   Pt is progressing well today.  She seems to be moving better/more independently in the bed. We were able to preform her transfers with two person assist and RW today with better ease/more confidence from the pt.  I believe she may be ready for short distance gait with RW next session with chair to follow.    Follow Up Recommendations  SNF     Does the patient have the potential to tolerate intense rehabilitation    NA  Barriers to Discharge   None      Equipment Recommendations  Rolling walker with 5" wheels    Recommendations for Other Services   None  Frequency Min 2X/week   Progress towards PT Goals Progress towards PT goals: Progressing toward goals  Plan Current plan remains appropriate    Precautions / Restrictions Precautions Precautions: Fall;Other (comment) Precaution Comments: Monitor O2 sats   Pertinent Vitals/Pain DOE 2/4 and O2 sats 90-91% on 5 L O2 [place] during mobility.     Mobility  Bed Mobility Overal bed mobility: Needs Assistance Bed Mobility: Supine to Sit Supine to sit: Supervision;HOB elevated General bed mobility comments: Supervision for safety, pt using bed rail to pull up to sitting.  Much  improved from last session Transfers Overall transfer level: Needs assistance Equipment used: Rolling walker (2 wheeled) Transfers: Sit to/from [employer];Stand Pivot Transfers Sit to Stand: Mod assist;+2 physical assistance Stand pivot transfers: Mod assist;+2 physical assistance General transfer comment: two person assist for safety and to increase pt's comfort/decrease fear of falling.  Assist needed to support trunk in standing over weak legs.  Pt with posterior lean and legs supported by bed for balance and to prevent knees buckling.  Able to take several steps around to [facility] and recliner chair with RW.      Exercises General Exercises - Upper Extremity Shoulder Flexion: AROM;Both;10 reps;Seated Elbow Flexion: AROM;Both;10 reps;Seated General Exercises - Lower Extremity Long Arc Quad: AROM;Both;10 reps;Seated Hip Flexion/Marching: AROM;Both;10 reps;Seated Toe Raises: AROM;Both;10 reps;Seated Heel Raises: AROM;Both;10 reps;Seated   PT Goals (current goals can now be found in the care plan section) Acute Rehab PT Goals Patient Stated Goal: to get stronger and go home  Visit Information  Last PT Received On: 03/06/13 Assistance Needed: +2 History of Present Illness: 80 y.o. female [name] with history of hypertension, hypothyroidism, paroxysmal atrial fibrillation, CHF was brought to the ER after patient was having increasing shortness of breath over the last one week. Patient was also noticed to be having increasing productive cough. Patient also has pleuritic type of chest pain on the right side. In the ER patient was noted to have elevated white cell count with elevated BNP and chest x-ray features concerning for pneumonia.  Pt s/p R  thoracentesis on 03/04/13.      Subjective Data  Subjective: Pt continues to report fatigue.   Patient Stated Goal: to get stronger and go home   Cognition  Cognition Arousal/Alertness: Awake/alert Behavior During Therapy: WFL for tasks  assessed/performed Overall Cognitive Status: Within Functional Limits for tasks assessed    Balance  Balance Overall balance assessment: Needs assistance Sitting-balance support: Bilateral upper extremity supported;Feet supported Sitting balance-Leahy Scale: Fair Standing balance support: Bilateral upper extremity supported Standing balance-Leahy Scale: Poor Standing balance comment: posterior lean with RW in standing.  Mod two person assist to support trunk in standing.  General Comments General comments (skin integrity, edema, etc.): O2 sats 90-91% on 5 L O2 [place] during mobility.  DOE 2/4 with mobility.   End of Session PT - End of Session Equipment Utilized During Treatment: Gait belt;Oxygen Activity Tolerance: Patient limited by fatigue;Treatment limited secondary to medical complications (Comment);Other (comment) (limited by DOE) Patient left: with call bell/phone within reach;with family/visitor present;in chair     Cashton Hosley B. Alexandre Lightsey, PT, DPT [phone]   03/06/2013, 2:41 PM

## 2013-03-06 NOTE — Progress Notes (Signed)
     SUBJECTIVE: No chest pain or SOB.   BP 131/50  Pulse 71  Temp(Src) 97.9 F (36.6 C) (Oral)  Resp 18  Ht 5\' 6"  (1.676 m)  Wt 168 lb 3.2 oz (76.295 kg)  BMI 27.16 kg/m2  SpO2 96% No intake or output data in the 24 hours ending 03/06/13 0923  PHYSICAL EXAM General: Well developed, well nourished, in no acute distress. Alert and oriented x 3.  Psych: Good affect, responds appropriately  Neck: No JVD. No masses noted.  Lungs: Clear bilaterally   Heart: Irreg irreg with no murmurs noted.  Abdomen: Bowel sounds are present. Soft, non-tender.  Extremities: No lower extremity edema.   LABS: Basic Metabolic Panel:  Recent Labs  40/98/1101/27/15 0530 03/05/13 0453  NA 140 141  K 4.4 4.0  CL 100 103  CO2 24 22  GLUCOSE 106* 105*  BUN 21 22  CREATININE 0.89 0.86  CALCIUM 8.8 8.6   CBC:  Recent Labs  03/04/13 0530 03/05/13 0453  WBC 20.1* 16.3*  NEUTROABS 17.0*  --   HGB 10.4* 11.2*  HCT 31.7* 32.8*  MCV 84.8 84.8  PLT 362 344   Current Meds: . aspirin  325 mg Oral Daily  . calcium-vitamin D  1 tablet Oral BID WC  . cefTRIAXone (ROCEPHIN)  IV  1 g Intravenous Q24H  . diltiazem  120 mg Oral Daily  . enoxaparin (LOVENOX) injection  70 mg Subcutaneous Q12H  . guaifenesin  200 mg Oral Q4H  . labetalol  200 mg Oral BID  . methimazole  5 mg Oral Daily  . multivitamin-lutein  1 capsule Oral Daily  . omega-3 acid ethyl esters  1 capsule Oral BID  . polyethylene glycol  17 g Oral Daily  . sodium chloride  3 mL Intravenous Q12H  . Warfarin - Pharmacist Dosing Inpatient   Does not apply q1800     ASSESSMENT AND PLAN:  1. Atrial fibrillation: Rate controlled on Cardizem CD 120 mg Qdaily. Coumadin was started but now on hold pending need for chest tube to drain pleural effusion. She is on Lovenox currently. Pt and family understands potential bleeding risk of long term anticoagulant therapy but also understand the risk of poor outcomes if she is not anti-coagulated with  acute DVT and atrial fib. Given advanced age she is at a higher risk of bleeding on any agent.   2. DVT: Currently on Lovenox with plans to resume coumadin after final recs in place regarding chest tube  3. Pneumonia: Per primary team.   4. Acute on chronic diastolic CHF: LV EF 60-65%. Volume is ok. I/O not reported last 24 hours. Overall negative 2.2 liters since admission   5. HTN: BP controlled.   6. Right pleural effusion: He has been seen by Pulmonary. Pt s/p diagnostic tap 03/04/13. Awaiting final cultures to finalized plan/need for chest tube/catheter.     Danielle Adams  1/29/20159:23 AM

## 2013-03-06 NOTE — Progress Notes (Signed)
80yo female with CAP (strep Pneumo) and parapneumonic effusion, s/p diagnostic thora 1/27.  No current indication for chest tube drainage (ie empyema, pain, dyspnea) and cultures remain prelim negative.  Will check back 1/30 to see if final culture data available.  Please call sooner if needed.   Danielle Adams,KATHRYN, NP 03/06/2013  10:19 AM Pager: [phone] or [phone]   Levy Pupaobert Latrece Nitta, MD, PhD 03/06/2013, 12:13 PM  Pulmonary and Critical Care [phone] or if no answer [phone]

## 2013-03-07 DIAGNOSIS — I82409 Acute embolism and thrombosis of unspecified deep veins of unspecified lower extremity: Secondary | ICD-10-CM | POA: Diagnosis not present

## 2013-03-07 DIAGNOSIS — I4891 Unspecified atrial fibrillation: Secondary | ICD-10-CM | POA: Diagnosis not present

## 2013-03-07 DIAGNOSIS — G250 Essential tremor: Secondary | ICD-10-CM | POA: Diagnosis not present

## 2013-03-07 DIAGNOSIS — J90 Pleural effusion, not elsewhere classified: Secondary | ICD-10-CM | POA: Diagnosis not present

## 2013-03-07 DIAGNOSIS — R279 Unspecified lack of coordination: Secondary | ICD-10-CM | POA: Diagnosis not present

## 2013-03-07 DIAGNOSIS — G252 Other specified forms of tremor: Secondary | ICD-10-CM | POA: Diagnosis not present

## 2013-03-07 DIAGNOSIS — J189 Pneumonia, unspecified organism: Secondary | ICD-10-CM | POA: Diagnosis not present

## 2013-03-07 DIAGNOSIS — R609 Edema, unspecified: Secondary | ICD-10-CM | POA: Diagnosis not present

## 2013-03-07 DIAGNOSIS — I517 Cardiomegaly: Secondary | ICD-10-CM | POA: Diagnosis not present

## 2013-03-07 DIAGNOSIS — I5020 Unspecified systolic (congestive) heart failure: Secondary | ICD-10-CM | POA: Diagnosis not present

## 2013-03-07 DIAGNOSIS — M6281 Muscle weakness (generalized): Secondary | ICD-10-CM | POA: Diagnosis not present

## 2013-03-07 DIAGNOSIS — Z5189 Encounter for other specified aftercare: Secondary | ICD-10-CM | POA: Diagnosis not present

## 2013-03-07 DIAGNOSIS — J40 Bronchitis, not specified as acute or chronic: Secondary | ICD-10-CM | POA: Diagnosis not present

## 2013-03-07 DIAGNOSIS — R5381 Other malaise: Secondary | ICD-10-CM | POA: Diagnosis not present

## 2013-03-07 DIAGNOSIS — R093 Abnormal sputum: Secondary | ICD-10-CM | POA: Diagnosis not present

## 2013-03-07 DIAGNOSIS — H6120 Impacted cerumen, unspecified ear: Secondary | ICD-10-CM | POA: Diagnosis not present

## 2013-03-07 DIAGNOSIS — E0590 Thyrotoxicosis, unspecified without thyrotoxic crisis or storm: Secondary | ICD-10-CM

## 2013-03-07 DIAGNOSIS — I5033 Acute on chronic diastolic (congestive) heart failure: Secondary | ICD-10-CM | POA: Diagnosis not present

## 2013-03-07 DIAGNOSIS — I10 Essential (primary) hypertension: Secondary | ICD-10-CM | POA: Diagnosis not present

## 2013-03-07 DIAGNOSIS — J9600 Acute respiratory failure, unspecified whether with hypoxia or hypercapnia: Secondary | ICD-10-CM | POA: Diagnosis not present

## 2013-03-07 DIAGNOSIS — R059 Cough, unspecified: Secondary | ICD-10-CM | POA: Diagnosis not present

## 2013-03-07 DIAGNOSIS — I2699 Other pulmonary embolism without acute cor pulmonale: Secondary | ICD-10-CM | POA: Diagnosis not present

## 2013-03-07 DIAGNOSIS — I509 Heart failure, unspecified: Secondary | ICD-10-CM | POA: Diagnosis not present

## 2013-03-07 LAB — PROTIME-INR
INR: 1.21 (ref 0.00–1.49)
PROTHROMBIN TIME: 15 s (ref 11.6–15.2)

## 2013-03-07 LAB — ANA, BODY FLUID: Anti-Nuclear Ab, IgG: NOT DETECTED

## 2013-03-07 MED ORDER — GUAIFENESIN 100 MG/5ML PO SOLN: 5.0000 mL | ORAL | Status: DC | PRN

## 2013-03-07 MED ORDER — WARFARIN SODIUM 5 MG PO TABS: 5.0000 mg | ORAL_TABLET | Freq: Every day | ORAL | Status: DC

## 2013-03-07 MED ORDER — CEFUROXIME AXETIL 250 MG PO TABS: 250.0000 mg | ORAL_TABLET | Freq: Two times a day (BID) | ORAL | Status: DC

## 2013-03-07 MED ORDER — DILTIAZEM HCL ER COATED BEADS 120 MG PO CP24: 120.0000 mg | ORAL_CAPSULE | Freq: Every day | ORAL | Status: DC

## 2013-03-07 MED ORDER — WARFARIN SODIUM 4 MG PO TABS
4.0000 mg | ORAL_TABLET | Freq: Once | ORAL | Status: DC
  Filled 2013-03-07: qty 1

## 2013-03-07 MED ORDER — ENOXAPARIN SODIUM 80 MG/0.8ML ~~LOC~~ SOLN: 70.0000 mg | Freq: Two times a day (BID) | SUBCUTANEOUS | Status: DC

## 2013-03-07 NOTE — Progress Notes (Signed)
CSW (Clinical Social Worker) prepared pt dc packet and placed with shadow chart. CSW arranged non-emergent ambulance transport for 1pm. Pt, pt family, pt nurse, and facility informed. CSW signing off.  Chandy Tarman, LCSWA [phone]  

## 2013-03-07 NOTE — Progress Notes (Addendum)
     SUBJECTIVE: Still with cough. No other complaints.   BP 139/47  Pulse 60  Temp(Src) 97.2 F (36.2 C) (Oral)  Resp 18  Ht 5\' 6"  (1.676 m)  Wt 157 lb 12.8 oz (71.578 kg)  BMI 25.48 kg/m2  SpO2 94%  Intake/Output Summary (Last 24 hours) at 03/07/13 0855 Last data filed at 03/06/13 1700  Gross per 24 hour  Intake    390 ml  Output    325 ml  Net     65 ml    PHYSICAL EXAM General: Well developed, well nourished, in no acute distress. Alert and oriented x 3.  Psych:  Good affect, responds appropriately Neck: No JVD. No masses noted.  Lungs: Clear bilaterally with no wheezes or rhonci noted.  Heart: Irreg irreg with no murmurs noted. Abdomen: Bowel sounds are present. Soft, non-tender.  Extremities: No lower extremity edema.   LABS: Basic Metabolic Panel:  Recent Labs  28/41/3201/28/15 0453  NA 141  K 4.0  CL 103  CO2 22  GLUCOSE 105*  BUN 22  CREATININE 0.86  CALCIUM 8.6   CBC:  Recent Labs  03/05/13 0453  WBC 16.3*  HGB 11.2*  HCT 32.8*  MCV 84.8  PLT 344    Current Meds: . aspirin  325 mg Oral Daily  . calcium-vitamin D  1 tablet Oral BID WC  . cefTRIAXone (ROCEPHIN)  IV  1 g Intravenous Q24H  . diltiazem  120 mg Oral Daily  . enoxaparin (LOVENOX) injection  70 mg Subcutaneous Q12H  . guaifenesin  200 mg Oral Q4H  . labetalol  200 mg Oral BID  . methimazole  5 mg Oral Daily  . multivitamin-lutein  1 capsule Oral Daily  . omega-3 acid ethyl esters  1 capsule Oral BID  . polyethylene glycol  17 g Oral Daily  . sodium chloride  3 mL Intravenous Q12H  . Warfarin - Pharmacist Dosing Inpatient   Does not apply q1800     ASSESSMENT AND PLAN:   1. Atrial fibrillation: Rate controlled on Cardizem CD 120 mg Qdaily. Coumadin was started but now on hold pending need for chest tube to drain pleural effusion. She is on Lovenox currently. Will start coumadin when it is clear that no chest tube will be needed for her pleural effusion. Pt and family understands  potential bleeding risk of long term anticoagulant therapy but also understand the risk of poor outcomes if she is not anti-coagulated with acute DVT and atrial fib. Given advanced age she is at a higher risk of bleeding on any agent. She can follow up with me in our [address] [facility] office in 2-3 weeks. Coumadin can be followed in our coumadin clinic or [facility]. This will need to be arranged.   2. DVT: Currently on Lovenox with plans to resume coumadin after final recs in place regarding chest tube. She will need to be discharged on Lovenox for acute DVT while coumadin becomes therapeutic.    3. Pneumonia: Per primary team.   4. Acute on chronic diastolic CHF: LV EF 60-65%. Volume is ok. I/O even. Overall negative 2.0 liters since admission   5. HTN: BP controlled.   6. Right pleural effusion: She has been seen by Pulmonary. Pt s/p diagnostic tap 03/04/13. Awaiting final cultures to finalize plan/need for chest tube/catheter.     MCALHANY,CHRISTOPHER  1/30/20158:55 AM

## 2013-03-07 NOTE — Progress Notes (Addendum)
ANTICOAGULATION CONSULT NOTE - Follow Up Consult  Pharmacy Consult for Lovenox and Coumadin Indication: DVT  No Known Allergies  Patient Measurements: Height: 5\' 6"  (167.6 cm) Weight: 157 lb 12.8 oz (71.578 kg) IBW/kg (Calculated) : 59.3  Vital Signs: Temp: 97.2 F (36.2 C) (01/30 0608) Temp src: Oral (01/30 0608) BP: 134/54 mmHg (01/30 1030) Pulse Rate: 64 (01/30 1030)  Labs:  Recent Labs  03/05/13 0453 03/06/13 0512 03/07/13 0434  HGB 11.2*  --   --   HCT 32.8*  --   --   PLT 344  --   --   LABPROT 14.8 14.7 15.0  INR 1.19 1.17 1.21  CREATININE 0.86  --   --     Estimated Creatinine Clearance: 39.7 ml/min (by C-G formula based on Cr of 0.86).   Assessment: 80 y/o female on Lovenox for new DVT, pt also has history of pAFib, was not on anticoagulation prior to admission.  Pharmacy consulted to dose Coumadin, pt received one dose of warfarin on 1/26, warfarin has been held since then due to potential drain placement for pleural effusion, now drain will not be placed, therefore will resume warfarin. No bleeding is noted, CBC is stable. INR 1.21, still subtherapeutic as expected.  Goal of Therapy:  INR 2-3 Anti-Xa level 0.6-1 units/ml 4hrs after LMWH dose given Monitor platelets by anticoagulation protocol: Yes   Plan:  -Warfarin 4 mg po x1 -Lovenox 70 mg SQ q12h -INR daily, CBC q72h   Agapito GamesAlison Dandrea Widdowson, PharmD, BCPS Clinical Pharmacist 03/07/2013 10:33 AM

## 2013-03-07 NOTE — Discharge Summary (Signed)
Physician Discharge Summary  Danielle Karvonenlise Neiss MVH:[id number] DOB: 1916/05/19 DOA: 02/25/2013  PCP: Illene RegulusMichael Norins, MD  Admit date: 02/25/2013 Discharge date: 03/07/2013  Time spent: 45 minutes  Recommendations for Outpatient Follow-up:  -Will be discharged to SNF today. -Her coumadin has been resumed and her dose will need to be adjusted according to INRs. I would check INR daily until we can confirm it is stable. -Will need to remain on lovenox 70 mg BID until INR has been therapeutic for at least 2 days. -Will need to follow up with Dr. Clifton JamesMcAlhany in 2-3 weeks in his office. -Continue to titrate oxygen as possible to keep sats >90% (on 4L at time of DC).   Discharge Diagnoses:  Principal Problem:   Community acquired pneumonia Active Problems:   HYPERTHYROIDISM   HYPERTENSION   ATRIAL FIBRILLATION, PAROXYSMAL   Acute on chronic diastolic congestive heart failure, NYHA class 4   Acute respiratory failure   DNR (do not resuscitate)   DVT (deep venous thrombosis)   Pleural effusion   Discharge Condition: Stable and improved.  Filed Weights   03/05/13 1100 03/06/13 0605 03/07/13 [id number]  Weight: 73.029 kg (161 lb) 76.295 kg (168 lb 3.2 oz) 71.578 kg (157 lb 12.8 oz)    History of present illness:  Danielle Adams is a 80 y.o. female with history of hypertension, hypothyroidism, paroxysmal atrial fibrillation, CHF was brought to the ER after patient was having increasing shortness of breath over the last one week. Patient was also noticed to be having increasing productive cough. Patient also has pleuritic type of chest pain on the right side. In the ER patient was noted to have elevated white cell count with elevated BNP and chest x-ray features concerning for pneumonia. Patient has been started on empiric antibiotics for pneumonia and one dose of Lasix 40 mg IV was given. On exam patient also has lower extremity edema. Patient otherwise denies any nausea vomiting abdominal pain diarrhea fever  chills. Patient states her daughter had similar symptoms last week.  Initially patient was on 2 L oxygen and at this time patient is on 6 L but patient is not in acute distress. Hospitalist admission was requested.  Hospital Course:   1-Acute Respiratory Failure: Secondary to PNA and Heart failure exacerbation.  . ceftriaxone was change to Zosyn for anaerobes coverage on 1-22. Vancomycin 1/24---1/27. Received 5 days of Azithromycin. Rocephin has been restarted per ID recommendations and has been transitioned to cefuroxime for 4 more days to complete a 14 day course of antibiotics. Strep Pneumonia urine Ag positive. Discussed with family, no BIPAP. on 5 L NS.  Nebulizer treatments PRN.Influenza negative.  Chest x ray 1-24: Increased confluent infiltrate in right lung base, mainly involving the right middle lobe. Increased small bilateral pleural effusions. This probably reflect increased oxygen requirement during early hospitalization.  WBC at 20. Discussed with ID, CT chest ordered.  Ct chest show large pleural effusion. Had a thoracentesis on 1/27, that looked exudative, however culture data is negative to this date.  2-Acute on Chronic Diastolic CHF - last EF measured was in April 2013 was 55-60%. Cardio consulted. BNP on admission at 10,383. Troponin times 3 negative. ECHO 1-21 Ef 55 %. Does not appear volume-overloaded at present. Keep I/o even at this point.  3-Paroxysmal atrial fibrillation presently rate controlled -Continue coumadin. INR remains subtherapeutic. No need to bridge with lovenox for a fib, but has also been diagnosed with a DVT.   4-Hyperglycemia - hemoglobin A1c 6.7.   5-Pleuritic type  of chest pain - probably from pneumonia. Resolved.   6-Hyperthyroidism - Continue methimazole. TSH 0.9   7-Hypokalemia: resolved.   8-E coli UTI; on Zosyn.   9-Right popliteal vein and right posterial tibial vein: will need to remain on levenox 70 mg BID until coumadin has been  therapeutic for at least 2 days.    Procedures:  Thoracentesis 1/27    Consultations:  Cardiology  Pulmonary  Discharge Instructions      Discharge Orders   Future Orders Complete By Expires   Diet - low sodium heart healthy  As directed    Discontinue IV  As directed    Increase activity slowly  As directed        Medication List    STOP taking these medications       diltiazem 180 MG 24 hr capsule  Commonly known as:  DILACOR XR     furosemide 40 MG tablet  Commonly known as:  LASIX      TAKE these medications       aspirin 325 MG tablet  Take 325 mg by mouth daily.     CALTRATE 600+D 600-400 MG-UNIT per tablet  Generic drug:  Calcium Carbonate-Vitamin D  Take 1 tablet by mouth 2 (two) times daily.     cefUROXime 250 MG tablet  Commonly known as:  CEFTIN  Take 1 tablet (250 mg total) by mouth 2 (two) times daily with a meal. For 4 days     diltiazem 120 MG 24 hr capsule  Commonly known as:  CARDIZEM CD  Take 1 capsule (120 mg total) by mouth daily.     docusate sodium 100 MG capsule  Commonly known as:  COLACE  Take 1 capsule (100 mg total) by mouth as needed. Stool Softener     enoxaparin 80 MG/0.8ML injection  Commonly known as:  LOVENOX  Inject 0.7 mLs (70 mg total) into the skin every 12 (twelve) hours.     EYE VITAMINS Caps  Take 1 each by mouth daily.     guaiFENesin 100 MG/5ML Soln  Commonly known as:  ROBITUSSIN  Take 5 mLs (100 mg total) by mouth every 4 (four) hours as needed for cough or to loosen phlegm.     labetalol 200 MG tablet  Commonly known as:  NORMODYNE  Take 200 mg by mouth 2 (two) times daily.     methimazole 5 MG tablet  Commonly known as:  TAPAZOLE  Take 5 mg by mouth daily.     Omega 3 1000 MG Caps  Take 1 capsule by mouth 2 (two) times daily.     warfarin 5 MG tablet  Commonly known as:  COUMADIN  Take 1 tablet (5 mg total) by mouth daily. Adjust according to INR.       No Known Allergies    The  results of significant diagnostics from this hospitalization (including imaging, microbiology, ancillary and laboratory) are listed below for reference.    Significant Diagnostic Studies: Dg Chest 2 View  03/01/2013   CLINICAL DATA:  Followup pneumonia.  EXAM: CHEST  2 VIEW  COMPARISON:  02/25/13  FINDINGS: Increased confluent opacity is seen in the right lung base, mainly involving the right middle lobe on the lateral projection. Small pleural effusions are noted bilaterally, also mildly enlarged. Cardiomegaly remains stable.  IMPRESSION: Increased confluent infiltrate in right lung base, mainly involving the right middle lobe. Increased small bilateral pleural effusions.  Stable cardiomegaly.   Electronically Signed   By:  Myles Rosenthal M.D.   On: 03/01/2013 17:16   Dg Chest 2 View  02/25/2013   CLINICAL DATA:  Weakness and shortness of breath. History of hypertension.  EXAM: CHEST  2 VIEW  COMPARISON:  Chest radiograph May 10, 2011  FINDINGS: New right middle lobe consolidation, with minimal patchy bibasilar airspace opacities. Blunting of the right costophrenic angle. No pneumothorax.  Stable cardiomegaly. Calcified aortic knob, mediastinal silhouette is otherwise unremarkable. Increasing interstitial prominence. Remote right lateral rib fractures. Osteopenia. Multiple EKG lines overlie the patient and may obscure subtle underlying pathology.  IMPRESSION: Right middle lobe consolidation concerning for pneumonia, with bibasilar patchy airspace opacities suggesting atelectasis with right lung base pleural thickening. Recommend followup chest radiograph after treatment to verify improvement.  Cardiomegaly, interstitial prominence suggesting COPD or possible edema.   Electronically Signed   By: Awilda Metro   On: 02/25/2013 22:09   Ct Chest Wo Contrast  03/04/2013   CLINICAL DATA:  Evaluate pneumonia.  Persistent leukocytosis.  EXAM: CT CHEST WITHOUT CONTRAST  TECHNIQUE: Multidetector CT imaging of the  chest was performed following the standard protocol without IV contrast.  COMPARISON:  03/01/2013  FINDINGS: There is moderate cardiac enlargement. No pericardial effusion noted. Calcified atherosclerotic disease affects the thoracic aorta. Prominent right paratracheal and sub- carinal lymph nodes are noted without evidence for adenopathy. The trachea appears patent and is midline. Patulous and mildly dilated esophagus is noted. Enteric contrast material is identified within the distal portion of the esophagus.  There is no axillary or supraclavicular adenopathy. Large nodule arising from the inferior pole of the right lobe of thyroid gland measures 4.3 cm.  There is a moderate to large right pleural effusion. Small left effusion is noted. Airspace consolidation involving the right middle lobe and right lower lobe identified. There are filling defects within the posterior basal segments of the right lower lobe bronchus which may reflect mucus plugging. There is mild atelectasis and consolidation involving the posterior medial left lower lobe. Patchy airspace consolidation and ground-glass attenuation is noted in the right upper lobe. There are patchy areas of ground-glass attenuation within the left upper lobe.  Incidental imaging through the upper abdomen shows no acute findings.  Review of the visualized osseous structures is significant for scoliosis and mild multi level spondylosis. No aggressive lytic or sclerotic bone lesions identified.  IMPRESSION: 1. Moderate to large right pleural effusion. A smaller left effusion is present. 2. Dense airspace consolidation involves the right middle lobe as well as the right lower lobe. Patchy areas of airspace in ground-glass attenuation consolidation within the both upper lobes. 3. Large nodule is noted arising from the inferior pole of the right lobe of the thyroid gland. Consider further evaluation with thyroid ultrasound. If patient is clinically hyperthyroid,  consider nuclear medicine thyroid uptake and scan.   Electronically Signed   By: Signa Kell M.D.   On: 03/04/2013 14:41   Dg Chest Port 1 View  03/04/2013   CLINICAL DATA:  Status post thoracentesis  EXAM: PORTABLE CHEST - 1 VIEW  COMPARISON:  Prior chest x-ray 03/01/2013; CT chest 03/04/2013  FINDINGS: No evidence of a pneumothorax. Persistent large right and small to moderate left layering pleural effusions with associated bibasilar opacities. Background patchy airspace opacity in the bilateral upper lungs. Stable cardiomegaly and atherosclerotic and tortuous thoracic aorta. No acute osseous abnormality.  IMPRESSION: 1. No evidence of pneumothorax. 2. Persistent large right and small to moderate left pleural effusions with associated bibasilar atelectasis versus infiltrate 3.  Stable to slightly improved patchy opacities in the bilateral upper lungs.   Electronically Signed   By: Malachy Moan M.D.   On: 03/04/2013 18:04    Microbiology: Recent Results (from the past 240 hour(s))  URINE CULTURE     Status: None   Collection Time    02/25/13 10:43 PM      Result Value Range Status   Specimen Description URINE, CLEAN CATCH   Final   Special Requests NONE   Final   Culture  Setup Time     Final   Value: 02/26/2013 10:41     Performed at [facility] Count     Final   Value: >=100,000 COLONIES/ML     Performed at [facility]   Culture     Final   Value: ESCHERICHIA COLI     Performed at [facility]   Report Status 02/28/2013 FINAL   Final   Organism ID, Bacteria ESCHERICHIA COLI   Final  AFB CULTURE WITH SMEAR     Status: None   Collection Time    03/04/13  4:06 PM      Result Value Range Status   Specimen Description PLEURAL FLUID RIGHT   Final   Special Requests Normal   Final   ACID FAST SMEAR     Final   Value: NO ACID FAST BACILLI SEEN     Performed at [facility]   Culture     Final   Value: CULTURE WILL BE EXAMINED FOR 6 WEEKS  BEFORE ISSUING A FINAL REPORT     Performed at [facility]   Report Status PENDING   Incomplete  BODY FLUID CULTURE     Status: None   Collection Time    03/04/13  4:06 PM      Result Value Range Status   Specimen Description PLEURAL FLUID RIGHT   Final   Special Requests Normal   Final   Gram Stain     Final   Value: RARE WBC PRESENT, PREDOMINANTLY PMN     NO ORGANISMS SEEN     Performed at [facility]   Culture     Final   Value: NO GROWTH 2 DAYS     Performed at [facility]   Report Status PENDING   Incomplete  FUNGUS CULTURE W SMEAR     Status: None   Collection Time    03/04/13  4:06 PM      Result Value Range Status   Specimen Description PLEURAL FLUID RIGHT   Final   Special Requests NONE   Final   Fungal Smear     Final   Value: NO YEAST OR FUNGAL ELEMENTS SEEN     Performed at [facility]   Culture     Final   Value: CULTURE IN PROGRESS FOR FOUR WEEKS     Performed at [facility]   Report Status PENDING   Incomplete     Labs: Basic Metabolic Panel:  Recent Labs Lab 03/01/13 0524 03/02/13 0430 03/03/13 0432 03/04/13 0530 03/05/13 0453  NA 142 139 143 140 141  K 3.8 3.4* 4.8 4.4 4.0  CL 101 99 102 100 103  CO2 25 25 26 24 22   GLUCOSE 126* 123* 117* 106* 105*  BUN 29* 26* 23 21 22   CREATININE 0.99 0.93 0.94 0.89 0.86  CALCIUM 8.7 8.4 8.7 8.8 8.6   Liver Function Tests:  Recent Labs Lab  03/04/13 1639  PROT 5.6*   No results found for this basename: LIPASE, AMYLASE,  in the last 168 hours No results found for this basename: AMMONIA,  in the last 168 hours CBC:  Recent Labs Lab 03/01/13 0524 03/02/13 0430 03/03/13 0432 03/04/13 0530 03/05/13 0453  WBC 22.3* 20.0* 20.4* 20.1* 16.3*  NEUTROABS  --   --   --  17.0*  --   HGB 10.2* 9.8* 9.9* 10.4* 11.2*  HCT 29.5* 29.3* 29.5* 31.7* 32.8*  MCV 82.2 83.0 84.3 84.8 84.8  PLT 185 223 272 362 344   Cardiac Enzymes: No results found for this basename:  CKTOTAL, CKMB, CKMBINDEX, TROPONINI,  in the last 168 hours BNP: BNP (last 3 results)  Recent Labs  02/25/13 2055  PROBNP 10383.0*   CBG: No results found for this basename: GLUCAP,  in the last 168 hours     Signed:  Chaya Jan  [facility] Pager: [phone] 03/07/2013, 9:44 AM

## 2013-03-08 LAB — BODY FLUID CULTURE
CULTURE: NO GROWTH
Special Requests: NORMAL

## 2013-03-13 LAB — MISCELLANEOUS TEST

## 2013-03-17 DIAGNOSIS — J40 Bronchitis, not specified as acute or chronic: Secondary | ICD-10-CM | POA: Diagnosis not present

## 2013-03-17 DIAGNOSIS — I517 Cardiomegaly: Secondary | ICD-10-CM | POA: Diagnosis not present

## 2013-03-17 DIAGNOSIS — R05 Cough: Secondary | ICD-10-CM | POA: Diagnosis not present

## 2013-03-17 DIAGNOSIS — R093 Abnormal sputum: Secondary | ICD-10-CM | POA: Diagnosis not present

## 2013-03-17 DIAGNOSIS — I4891 Unspecified atrial fibrillation: Secondary | ICD-10-CM | POA: Diagnosis not present

## 2013-03-17 DIAGNOSIS — H6120 Impacted cerumen, unspecified ear: Secondary | ICD-10-CM | POA: Diagnosis not present

## 2013-03-19 DIAGNOSIS — I509 Heart failure, unspecified: Secondary | ICD-10-CM | POA: Diagnosis not present

## 2013-03-21 ENCOUNTER — Encounter: Payer: Self-pay | Admitting: Cardiovascular Disease

## 2013-03-21 ENCOUNTER — Ambulatory Visit (INDEPENDENT_AMBULATORY_CARE_PROVIDER_SITE_OTHER): Payer: Medicare Other | Admitting: Cardiovascular Disease

## 2013-03-21 VITALS — BP 110/50 | HR 59 | Ht 66.0 in | Wt 149.0 lb

## 2013-03-21 DIAGNOSIS — I4821 Permanent atrial fibrillation: Secondary | ICD-10-CM

## 2013-03-21 DIAGNOSIS — I82409 Acute embolism and thrombosis of unspecified deep veins of unspecified lower extremity: Secondary | ICD-10-CM | POA: Diagnosis not present

## 2013-03-21 DIAGNOSIS — I5033 Acute on chronic diastolic (congestive) heart failure: Secondary | ICD-10-CM | POA: Diagnosis not present

## 2013-03-21 DIAGNOSIS — I509 Heart failure, unspecified: Secondary | ICD-10-CM | POA: Diagnosis not present

## 2013-03-21 DIAGNOSIS — I10 Essential (primary) hypertension: Secondary | ICD-10-CM

## 2013-03-21 DIAGNOSIS — J90 Pleural effusion, not elsewhere classified: Secondary | ICD-10-CM

## 2013-03-21 DIAGNOSIS — I4891 Unspecified atrial fibrillation: Secondary | ICD-10-CM | POA: Diagnosis not present

## 2013-03-21 MED ORDER — FUROSEMIDE 40 MG PO TABS: 40.0000 mg | ORAL_TABLET | Freq: Two times a day (BID) | ORAL | Status: DC

## 2013-03-21 NOTE — Progress Notes (Signed)
History of Present Illness: 80 yo female with history of HTN, Hypothyroidism, paroxysmal atrial fibrillation, chronic diastolic CHF, pleural effusion here today for cardiac follow up.  She was admitted to [facility] 02/25/13 with c/o dyspnea, LE edema, cough. She was found to be volume overloaded and felt to have a pneumonia, found to have acute LE DVT, pleural effusion. She was diuresed with IV Lasix and treated for pneumonia. Echo with normal LV size and function, mild AS, mild AI, mild MR. She had rate controlled atrial fibrillation during the hospitalization. She was also found to have a DVT. She was started on Cardizem for rate control of atrial fib and coumadin for anti-coagulation given atrial fib and DVT. Pulmonary followed her in the hospital for a pleural effusion and performed diagnostic thoracentesis. No chest tube was placed. She was discharged to [facility] nursing facility.   She is here today for follow up. She is doing well overall. She has had recurrent pneumonia and was placed on antibiotics by the nursing facility team. No chest pain. INR has been followed in her SNF. She does have recurrence of lower ext edema.   Primary Care Physician: Norins  Past Medical History  Diagnosis Date  . Atrial fibrillation     arrythmia  . HTN (hypertension)   . Familial tremor   . Allergic rhinitis, cause unspecified 01/25/2011  . Thyroid disease   . Chronic diastolic CHF (congestive heart failure)     Past Surgical History  Procedure Laterality Date  . Tonsillectomy    . Dilation and curettage of uterus    . Bunionectomy    . Laparoscopic ovarian cystectomy  04/2009    Current Outpatient Prescriptions  Medication Sig Dispense Refill  . diltiazem (CARDIZEM CD) 120 MG 24 hr capsule Take 1 capsule (120 mg total) by mouth daily.      Marland Kitchen levofloxacin (LEVAQUIN) 500 MG tablet Take 500 mg by mouth daily.      Marland Kitchen warfarin (COUMADIN) 5 MG tablet Take 1 tablet (5 mg total) by mouth daily. Adjust  according to INR.  15 tablet  0  . aspirin 325 MG tablet Take 325 mg by mouth daily.       . Calcium Carbonate-Vitamin D (CALTRATE 600+D) 600-400 MG-UNIT per tablet Take 1 tablet by mouth 2 (two) times daily.       . cefUROXime (CEFTIN) 250 MG tablet Take 1 tablet (250 mg total) by mouth 2 (two) times daily with a meal. For 4 days      . docusate sodium (COLACE) 100 MG capsule Take 1 capsule (100 mg total) by mouth as needed. Stool Softener  10 capsule  3  . enoxaparin (LOVENOX) 80 MG/0.8ML injection Inject 0.7 mLs (70 mg total) into the skin every 12 (twelve) hours.  22.4 Syringe    . guaiFENesin (ROBITUSSIN) 100 MG/5ML SOLN Take 5 mLs (100 mg total) by mouth every 4 (four) hours as needed for cough or to loosen phlegm.  1200 mL  0  . labetalol (NORMODYNE) 200 MG tablet Take 200 mg by mouth 2 (two) times daily.      . methimazole (TAPAZOLE) 5 MG tablet Take 5 mg by mouth daily.      . Multiple Vitamins-Minerals (EYE VITAMINS) CAPS Take 1 each by mouth daily.       . OMEGA 3 1000 MG CAPS Take 1 capsule by mouth 2 (two) times daily.       . [DISCONTINUED] diltiazem (TIAZAC) 180 MG 24 hr  capsule Take 1 capsule (180 mg total) by mouth daily.  30 capsule  11   No current facility-administered medications for this visit.    No Known Allergies  History   Social History  . Marital Status: Widowed    Spouse Name: N/A    Number of Children: N/A  . Years of Education: N/A   Occupational History  . Not on file.   Social History Main Topics  . Smoking status: Never Smoker   . Smokeless tobacco: Not on file  . Alcohol Use: No  . Drug Use: No  . Sexual Activity: Not on file   Other Topics Concern  . Not on file   Social History Narrative   [employer]. 1 year business school. Married 1948-widowed 1981. 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 [place]living with [place]. Terminal care-does Not want cardiac resuscitation or  mechanical ventilation, artifical nutrition or hydration, renal dialysis or major heroic intervention. Laymen's Guide with forms provided.    Family History  Problem Relation Age of Onset  . Pancreatic cancer Mother   . Coronary artery disease Father   . Diabetes Sister   . Diabetes Brother     Review of Systems:  As stated in the HPI and otherwise negative.   BP 110/50  Pulse 59  Ht 5\' 6"  (1.676 m)  Wt 149 lb (67.586 kg)  BMI 24.06 kg/m2  Physical Examination: General: Well developed, well nourished, NAD HEENT: OP clear, mucus membranes moist SKIN: warm, dry. No rashes. Neuro: No focal deficits Musculoskeletal: Muscle strength 5/5 all ext Psychiatric: Mood and affect normal Neck: No JVD, no carotid bruits, no thyromegaly, no lymphadenopathy. Lungs:Clear bilaterally, no wheezes, rhonci, crackles Cardiovascular: Irregular irregular No murmurs, gallops or rubs. Abdomen:Soft. Bowel sounds present. Non-tender.  Extremities: 2+ bilateral lower extremity edema.   Echo 02/26/13: Left ventricle: The cavity size was normal. Wall thickness was increased in a pattern of mild LVH. Systolic function was normal. The estimated ejection fraction was in the range of 60% to 65%. Wall motion was normal; there were no regional wall motion abnormalities. - Aortic valve: There was mild stenosis. Mild regurgitation. Valve area: 1.06cm^2(VTI). Valve area: 0.98cm^2 (Vmax). - Mitral valve: Mild regurgitation. - Left atrium: The atrium was moderately dilated. - Right atrium: The atrium was moderately dilated. - Tricuspid valve: Moderate regurgitation. - Pulmonary arteries: PA peak pressure: 42mm Hg (S). - Pericardium, extracardiac: A trivial pericardial effusion was identified posterior to the heart.  EKG: Atrial fibrillation, rate 59 bpm. RBBB  Assessment and Plan:   1. Atrial fibrillation: Rate is controlled. Continue Cardizem.  She has been anti-coagulated with coumadin. Her INR is being  followed in her facility. She can f/u in our coumadin clinic when she is discharged. No plans for cardioversion.   2. Acute on Chronic diastolic CHF: She has 2+ bilateral lower ext edema today. This had resolved in the hospital. She has been on Lasix 20 mg po BID. Will increase Lasix to 40 mg po BID for 4 days then resume 40 mg per day unless LE edema still present.    3. HTN: BP controlled.   4. DVT: Continue coumadin.   5. Pleural effusion: She had a pleural effusion in the hospital and had thoracentesis. Repeat CXR in the SNF. Has been treated for recurrent pneumonia. I would like to refer her to see Dr. Delton CoombesByrum in the pulmonary office since he saw her in the hospital.

## 2013-03-21 NOTE — Patient Instructions (Signed)
Your physician recommends that you schedule a follow-up appointment in: 6-8 weeks.   Your physician has recommended you make the following change in your medication: Increase furosemide to 40 mg by mouth twice daily

## 2013-03-24 DIAGNOSIS — R059 Cough, unspecified: Secondary | ICD-10-CM | POA: Diagnosis not present

## 2013-03-24 DIAGNOSIS — R609 Edema, unspecified: Secondary | ICD-10-CM | POA: Diagnosis not present

## 2013-03-24 DIAGNOSIS — R05 Cough: Secondary | ICD-10-CM | POA: Diagnosis not present

## 2013-03-25 DIAGNOSIS — J90 Pleural effusion, not elsewhere classified: Secondary | ICD-10-CM | POA: Diagnosis not present

## 2013-03-26 ENCOUNTER — Institutional Professional Consult (permissible substitution): Payer: Medicare Other | Admitting: Emergency Medicine

## 2013-03-26 LAB — PROTIME-INR

## 2013-03-30 LAB — PROTIME-INR

## 2013-03-31 LAB — FUNGUS CULTURE W SMEAR: Fungal Smear: NONE SEEN

## 2013-04-01 ENCOUNTER — Institutional Professional Consult (permissible substitution): Payer: Medicare Other | Admitting: Emergency Medicine

## 2013-04-02 ENCOUNTER — Ambulatory Visit (INDEPENDENT_AMBULATORY_CARE_PROVIDER_SITE_OTHER): Payer: Medicare Other | Admitting: Emergency Medicine

## 2013-04-02 ENCOUNTER — Ambulatory Visit (INDEPENDENT_AMBULATORY_CARE_PROVIDER_SITE_OTHER)
Admission: RE | Admit: 2013-04-02 | Discharge: 2013-04-02 | Disposition: A | Discharge status: Home / Self Care | Payer: Medicare Other | Source: Ambulatory Visit | Attending: Emergency Medicine | Admitting: Emergency Medicine

## 2013-04-02 ENCOUNTER — Encounter: Payer: Self-pay | Admitting: Emergency Medicine

## 2013-04-02 VITALS — BP 130/62 | HR 62 | Ht 65.0 in | Wt 144.0 lb

## 2013-04-02 DIAGNOSIS — J90 Pleural effusion, not elsewhere classified: Secondary | ICD-10-CM

## 2013-04-02 NOTE — Assessment & Plan Note (Signed)
parapneumonic effusion from recent hosp for R PNA.  - recheck CXR today.  - discussed pros and cons of drainage, we agree that we should defer any thora at this time unless the fluid has increased compared with her hospitalization.  - rov 3

## 2013-04-02 NOTE — Progress Notes (Signed)
Subjective:    Patient ID: Danielle Adams, female    DOB: [birth year], 80 y.o.   MRN: [id number]  HPI Pleasant 80 yo woman. She is s/p hosp for R PNA, parapneumonic effusion. She is recovering, doing PT/OT at [facility]. She still has some cough, sometimes associated with drinking H2O. She was sent home on 4L/min.    Review of Systems  Constitutional: Positive for unexpected weight change. Negative for fever.  HENT: Positive for congestion. Negative for dental problem, ear pain, nosebleeds, postnasal drip, rhinorrhea, sinus pressure, sneezing, sore throat and trouble swallowing.   Eyes: Negative for redness and itching.  Respiratory: Positive for cough. Negative for chest tightness, shortness of breath and wheezing.   Cardiovascular: Negative for palpitations and leg swelling.  Gastrointestinal: Negative for nausea and vomiting.  Genitourinary: Negative for dysuria.  Musculoskeletal: Negative for joint swelling.  Skin: Negative for rash.  Neurological: Negative for headaches.  Hematological: Does not bruise/bleed easily.  Psychiatric/Behavioral: Negative for dysphoric mood. The patient is not nervous/anxious.    Past Medical History  Diagnosis Date  . Atrial fibrillation     arrythmia  . HTN (hypertension)   . Familial tremor   . Allergic rhinitis, cause unspecified 01/25/2011  . Thyroid disease   . Chronic diastolic CHF (congestive heart failure)      Family History  Problem Relation Age of Onset  . Pancreatic cancer Mother   . Coronary artery disease Father   . Diabetes Sister   . Diabetes Brother      History   Social History  . Marital Status: Widowed    Spouse Name: N/A    Number of Children: N/A  . Years of Education: N/A   Occupational History  . Not on file.   Social History Main Topics  . Smoking status: Never Smoker   . Smokeless tobacco: Never Used  . Alcohol Use: No  . Drug Use: No  . Sexual Activity: Not on file   Other Topics Concern  . Not on  file   Social History Narrative   [employer]. 1 year business school. Married 1948-widowed 1981. 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 Just moved to [facility] August '08-living with daughter. Terminal care-does Not want cardiac resuscitation or mechanical ventilation, artifical nutrition or hydration, renal dialysis or major heroic intervention. Laymen's Guide with forms provided.     No Known Allergies   Outpatient Prescriptions Prior to Visit  Medication Sig Dispense Refill  . aspirin 325 MG tablet Take 325 mg by mouth daily.       . Calcium Carbonate-Vitamin D (CALTRATE 600+D) 600-400 MG-UNIT per tablet Take 1 tablet by mouth 2 (two) times daily.       Marland Kitchen diltiazem (CARDIZEM CD) 120 MG 24 hr capsule Take 1 capsule (120 mg total) by mouth daily.      Marland Kitchen docusate sodium (COLACE) 100 MG capsule Take 1 capsule (100 mg total) by mouth as needed. Stool Softener  10 capsule  3  . furosemide (LASIX) 40 MG tablet Take 1 tablet (40 mg total) by mouth 2 (two) times daily.  60 tablet  0  . labetalol (NORMODYNE) 200 MG tablet Take 200 mg by mouth 2 (two) times daily.      . methimazole (TAPAZOLE) 5 MG tablet Take 5 mg by mouth daily.      Marland Kitchen warfarin (COUMADIN) 5 MG tablet Take 1 tablet (5 mg total) by mouth daily. Adjust according to INR.  15 tablet  0  .  cefUROXime (CEFTIN) 250 MG tablet Take 1 tablet (250 mg total) by mouth 2 (two) times daily with a meal. For 4 days      . enoxaparin (LOVENOX) 80 MG/0.8ML injection Inject 0.7 mLs (70 mg total) into the skin every 12 (twelve) hours.  22.4 Syringe    . guaiFENesin (ROBITUSSIN) 100 MG/5ML SOLN Take 5 mLs (100 mg total) by mouth every 4 (four) hours as needed for cough or to loosen phlegm.  1200 mL  0  . levofloxacin (LEVAQUIN) 500 MG tablet Take 500 mg by mouth daily.      . Multiple Vitamins-Minerals (EYE VITAMINS) CAPS Take 1 each by mouth daily.       . OMEGA 3 1000 MG CAPS Take 1 capsule by mouth 2 (two)  times daily.        No facility-administered medications prior to visit.          Objective:   Physical Exam Filed Vitals:   04/02/13 1027  BP: 130/62  Pulse: 62  Height: 5\' 5"  (1.651 m)  Weight: 144 lb (65.318 kg)  SpO2: 90%   Gen: Pleasant elderly woman, some tremor, in no distress,  normal affect  ENT: No lesions,  mouth clear,  oropharynx clear, no postnasal drip  Neck: No JVD, no TMG, no carotid bruits  Lungs: No use of accessory muscles, clear without rales or rhonchi, decreased at the R base  Cardiovascular: RRR, heart sounds normal, no murmur or gallops, no peripheral edema  Musculoskeletal: No deformities, no cyanosis or clubbing  Neuro: alert, non focal  Skin: Warm, no lesions or rashes      Assessment & Plan:  Pleural effusion parapneumonic effusion from recent hosp for R PNA.  - recheck CXR today.  - discussed pros and cons of drainage, we agree that we should defer any thora at this time unless the fluid has increased compared with her hospitalization.  - rov 3

## 2013-04-02 NOTE — Patient Instructions (Signed)
We will perform a CXR today You will be called with the CXR results and to decide whether there is any benefit to performing fluid drainage.  Follow with Dr Delton CoombesByrum in 3 months or sooner if you have any problems.

## 2013-04-06 DIAGNOSIS — R279 Unspecified lack of coordination: Secondary | ICD-10-CM | POA: Diagnosis not present

## 2013-04-06 DIAGNOSIS — R05 Cough: Secondary | ICD-10-CM | POA: Diagnosis not present

## 2013-04-06 DIAGNOSIS — M6281 Muscle weakness (generalized): Secondary | ICD-10-CM | POA: Diagnosis not present

## 2013-04-06 DIAGNOSIS — I10 Essential (primary) hypertension: Secondary | ICD-10-CM | POA: Diagnosis not present

## 2013-04-06 DIAGNOSIS — R059 Cough, unspecified: Secondary | ICD-10-CM | POA: Diagnosis not present

## 2013-04-06 DIAGNOSIS — J189 Pneumonia, unspecified organism: Secondary | ICD-10-CM | POA: Diagnosis not present

## 2013-04-06 DIAGNOSIS — I509 Heart failure, unspecified: Secondary | ICD-10-CM | POA: Diagnosis not present

## 2013-04-06 DIAGNOSIS — I4891 Unspecified atrial fibrillation: Secondary | ICD-10-CM | POA: Diagnosis not present

## 2013-04-06 DIAGNOSIS — R5381 Other malaise: Secondary | ICD-10-CM | POA: Diagnosis not present

## 2013-04-06 DIAGNOSIS — Z5189 Encounter for other specified aftercare: Secondary | ICD-10-CM | POA: Diagnosis not present

## 2013-04-06 DIAGNOSIS — G250 Essential tremor: Secondary | ICD-10-CM | POA: Diagnosis not present

## 2013-04-16 LAB — AFB CULTURE WITH SMEAR (NOT AT ARMC)
Acid Fast Smear: NONE SEEN
Special Requests: NORMAL

## 2013-04-22 ENCOUNTER — Telehealth: Payer: Self-pay | Admitting: Internal Medicine

## 2013-04-22 NOTE — Telephone Encounter (Signed)
Very sorry, but I would need to decline at this time, thanks

## 2013-04-22 NOTE — Telephone Encounter (Signed)
Pt would like to transfer from Dr. Debby BudNorins to Dr. Jonny RuizJohn.  Dr. Raphael GibneyJohn's 50 has been filled.  Will be ok to add this patient?

## 2013-04-22 NOTE — Telephone Encounter (Signed)
LMOM to call back

## 2013-04-23 NOTE — Telephone Encounter (Signed)
Daughter is aware 

## 2013-04-25 ENCOUNTER — Ambulatory Visit (INDEPENDENT_AMBULATORY_CARE_PROVIDER_SITE_OTHER): Payer: Medicare Other | Admitting: General Practice

## 2013-04-25 DIAGNOSIS — I509 Heart failure, unspecified: Secondary | ICD-10-CM | POA: Diagnosis not present

## 2013-04-25 DIAGNOSIS — J90 Pleural effusion, not elsewhere classified: Secondary | ICD-10-CM

## 2013-04-25 DIAGNOSIS — I4891 Unspecified atrial fibrillation: Secondary | ICD-10-CM | POA: Diagnosis not present

## 2013-04-25 DIAGNOSIS — Z9981 Dependence on supplemental oxygen: Secondary | ICD-10-CM | POA: Diagnosis not present

## 2013-04-25 DIAGNOSIS — I82409 Acute embolism and thrombosis of unspecified deep veins of unspecified lower extremity: Secondary | ICD-10-CM | POA: Diagnosis not present

## 2013-04-25 DIAGNOSIS — R269 Unspecified abnormalities of gait and mobility: Secondary | ICD-10-CM | POA: Diagnosis not present

## 2013-04-25 DIAGNOSIS — Z5181 Encounter for therapeutic drug level monitoring: Secondary | ICD-10-CM | POA: Diagnosis not present

## 2013-04-25 DIAGNOSIS — I10 Essential (primary) hypertension: Secondary | ICD-10-CM | POA: Diagnosis not present

## 2013-04-25 LAB — POCT INR: INR: 2.4

## 2013-04-25 NOTE — Progress Notes (Signed)
Pre visit review using our clinic review tool, if applicable. No additional management support is needed unless otherwise documented below in the visit note. 

## 2013-04-28 DIAGNOSIS — I509 Heart failure, unspecified: Secondary | ICD-10-CM | POA: Diagnosis not present

## 2013-04-28 DIAGNOSIS — J90 Pleural effusion, not elsewhere classified: Secondary | ICD-10-CM | POA: Diagnosis not present

## 2013-04-28 DIAGNOSIS — Z9981 Dependence on supplemental oxygen: Secondary | ICD-10-CM | POA: Diagnosis not present

## 2013-04-28 DIAGNOSIS — I4891 Unspecified atrial fibrillation: Secondary | ICD-10-CM | POA: Diagnosis not present

## 2013-04-28 DIAGNOSIS — R269 Unspecified abnormalities of gait and mobility: Secondary | ICD-10-CM | POA: Diagnosis not present

## 2013-04-28 DIAGNOSIS — I10 Essential (primary) hypertension: Secondary | ICD-10-CM | POA: Diagnosis not present

## 2013-04-29 DIAGNOSIS — J90 Pleural effusion, not elsewhere classified: Secondary | ICD-10-CM | POA: Diagnosis not present

## 2013-04-29 DIAGNOSIS — I4891 Unspecified atrial fibrillation: Secondary | ICD-10-CM | POA: Diagnosis not present

## 2013-04-29 DIAGNOSIS — R269 Unspecified abnormalities of gait and mobility: Secondary | ICD-10-CM | POA: Diagnosis not present

## 2013-04-29 DIAGNOSIS — Z9981 Dependence on supplemental oxygen: Secondary | ICD-10-CM | POA: Diagnosis not present

## 2013-04-29 DIAGNOSIS — I10 Essential (primary) hypertension: Secondary | ICD-10-CM | POA: Diagnosis not present

## 2013-04-29 DIAGNOSIS — I509 Heart failure, unspecified: Secondary | ICD-10-CM | POA: Diagnosis not present

## 2013-04-30 DIAGNOSIS — I509 Heart failure, unspecified: Secondary | ICD-10-CM | POA: Diagnosis not present

## 2013-04-30 DIAGNOSIS — I10 Essential (primary) hypertension: Secondary | ICD-10-CM | POA: Diagnosis not present

## 2013-04-30 DIAGNOSIS — Z9981 Dependence on supplemental oxygen: Secondary | ICD-10-CM | POA: Diagnosis not present

## 2013-04-30 DIAGNOSIS — J90 Pleural effusion, not elsewhere classified: Secondary | ICD-10-CM | POA: Diagnosis not present

## 2013-04-30 DIAGNOSIS — R269 Unspecified abnormalities of gait and mobility: Secondary | ICD-10-CM | POA: Diagnosis not present

## 2013-04-30 DIAGNOSIS — I4891 Unspecified atrial fibrillation: Secondary | ICD-10-CM | POA: Diagnosis not present

## 2013-05-01 ENCOUNTER — Ambulatory Visit (INDEPENDENT_AMBULATORY_CARE_PROVIDER_SITE_OTHER): Payer: Medicare Other | Admitting: Internal Medicine

## 2013-05-01 ENCOUNTER — Encounter: Payer: Self-pay | Admitting: Internal Medicine

## 2013-05-01 ENCOUNTER — Ambulatory Visit (INDEPENDENT_AMBULATORY_CARE_PROVIDER_SITE_OTHER)
Admission: RE | Admit: 2013-05-01 | Discharge: 2013-05-01 | Disposition: A | Discharge status: Home / Self Care | Payer: Medicare Other | Source: Ambulatory Visit | Attending: Internal Medicine | Admitting: Internal Medicine

## 2013-05-01 ENCOUNTER — Other Ambulatory Visit (INDEPENDENT_AMBULATORY_CARE_PROVIDER_SITE_OTHER): Payer: Medicare Other

## 2013-05-01 VITALS — BP 122/60 | HR 52 | Temp 98.0°F | Wt 144.5 lb

## 2013-05-01 DIAGNOSIS — I10 Essential (primary) hypertension: Secondary | ICD-10-CM | POA: Diagnosis not present

## 2013-05-01 DIAGNOSIS — I509 Heart failure, unspecified: Secondary | ICD-10-CM | POA: Diagnosis not present

## 2013-05-01 DIAGNOSIS — K644 Residual hemorrhoidal skin tags: Secondary | ICD-10-CM

## 2013-05-01 DIAGNOSIS — I872 Venous insufficiency (chronic) (peripheral): Secondary | ICD-10-CM | POA: Diagnosis not present

## 2013-05-01 DIAGNOSIS — I5033 Acute on chronic diastolic (congestive) heart failure: Secondary | ICD-10-CM

## 2013-05-01 DIAGNOSIS — J189 Pneumonia, unspecified organism: Secondary | ICD-10-CM | POA: Diagnosis not present

## 2013-05-01 DIAGNOSIS — J90 Pleural effusion, not elsewhere classified: Secondary | ICD-10-CM

## 2013-05-01 DIAGNOSIS — I4891 Unspecified atrial fibrillation: Secondary | ICD-10-CM

## 2013-05-01 LAB — BASIC METABOLIC PANEL
BUN: 19 mg/dL (ref 6–23)
CHLORIDE: 107 meq/L (ref 96–112)
CO2: 27 mEq/L (ref 19–32)
Calcium: 9.3 mg/dL (ref 8.4–10.5)
Creatinine, Ser: 1.2 mg/dL (ref 0.4–1.2)
GFR: 44.35 mL/min — AB (ref >60.00)
Glucose, Bld: 109 mg/dL — ABNORMAL HIGH (ref 70–99)
POTASSIUM: 5.3 meq/L — AB (ref 3.5–5.1)
SODIUM: 140 meq/L (ref 135–145)

## 2013-05-01 LAB — HEMOGLOBIN AND HEMATOCRIT, BLOOD
HEMATOCRIT: 34.5 % — AB (ref 36.0–46.0)
HEMOGLOBIN: 11.3 g/dL — AB (ref 12.0–15.0)

## 2013-05-01 LAB — BRAIN NATRIURETIC PEPTIDE: Pro B Natriuretic peptide (BNP): 520 pg/mL — ABNORMAL HIGH (ref 0.0–100.0)

## 2013-05-01 MED ORDER — HYDROCORTISONE 2.5 % RE CREA: 1.0000 "application " | TOPICAL_CREAM | Freq: Two times a day (BID) | RECTAL | Status: DC

## 2013-05-01 NOTE — Progress Notes (Signed)
Subjective:    Patient ID: Danielle Adams, female    DOB: 06-01-1916, 80 y.o.   MRN: [id number]  HPI Danielle Adams was hospitalized in January for CAP with a complicated course: she developed a DVT, she developed a pleural effusion; she had the on-set of A. Fib and was generally deconditioned by the experience. At time of d/c she went to [facility] and was cared for by Dr. Pete Glatter. Last CXR 2/25 with persistent pleural fluid.  Since being home she has had continued problem with hemorrhoid,external, with scant blood on the tissue but will occasionally have bleeding.   She has seen McAlhany in f/u, note reviewed. He had recommended furosemide 40 mg daily, however she has been taking 40 mg alternating with 80 mg on alternate days. She is still on oxygen at home 2/2 pleural effusion and question of CHF.   Her bowel habit has changed - she is having soft stools and she has less control of sphincter - with stool in the depend.   She has had a problem with hemorrhoidal bleeding exacerbated by being on blood thinner.  Past Medical History  Diagnosis Date  . Atrial fibrillation     arrythmia  . HTN (hypertension)   . Familial tremor   . Allergic rhinitis, cause unspecified 01/25/2011  . Thyroid disease   . Chronic diastolic CHF (congestive heart failure)    Past Surgical History  Procedure Laterality Date  . Tonsillectomy    . Dilation and curettage of uterus    . Bunionectomy    . Laparoscopic ovarian cystectomy  04/2009   Family History  Problem Relation Age of Onset  . Pancreatic cancer Mother   . Coronary artery disease Father   . Diabetes Sister   . Diabetes Brother    History   Social History  . Marital Status: Widowed    Spouse Name: N/A    Number of Children: N/A  . Years of Education: N/A   Occupational History  . Not on file.   Social History Main Topics  . Smoking status: Never Smoker   . Smokeless tobacco: Never Used  . Alcohol Use: No  . Drug Use: No    . Sexual Activity: Not on file   Other Topics Concern  . Not on file   Social History Narrative   [employer]. 1 year business school. Married 1948-widowed 1981. 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 Just moved to [facility] August '08-living with daughter. Terminal care-does Not want cardiac resuscitation or mechanical ventilation, artifical nutrition or hydration, renal dialysis or major heroic intervention. Laymen's Guide with forms provided.      Review of Systems System review is negative for any constitutional, cardiac, pulmonary, GI or neuro symptoms or complaints other than as described in the HPI.     Objective:   Physical Exam Filed Vitals:   05/01/13 1416  BP: 122/60  Pulse: 52  Temp: 98 F (36.7 C)   BP Readings from Last 3 Encounters:  05/01/13 122/60  04/02/13 130/62  03/21/13 110/50   Wt Readings from Last 3 Encounters:  05/01/13 144 lb 8 oz (65.545 kg)  04/02/13 144 lb (65.318 kg)  03/21/13 149 lb (67.586 kg)   Gen'l- Very elderly woman with an essential tremor who remains mentally alert and clear. HEENT_ C&S clear Neck - supple Cor 2+ radial, IRIR rate controlled Pul - no increased WOB, decreased breath sounds 1/2 way up right and 1/3 up on the left, no  wheezing, no rales. Neuro - Awake and alert, speech clear, essential tremor        Assessment & Plan:

## 2013-05-01 NOTE — Progress Notes (Signed)
Pre visit review using our clinic review tool, if applicable. No additional management support is needed unless otherwise documented below in the visit note. 

## 2013-05-01 NOTE — Patient Instructions (Signed)
Thank you for coming in to see me. I will ask Dr. Felicity CoyerLeschber to assume your care here in this office.  1. Pulmonary - still with decreased breath sounds right worse than left but no wheezing or "rales." This suggests persistent pleural effusion but no fluid in the lung.   Plan Chest xray to check on the pleural effusion, to evaluate for atelectasis (incomplete expansion of the lung) and for pulmonary edema (fluid in the lung). Recommendations to follow, like changing lasix or doing incentive spirometry           Lab: BNP - to check for pulmonary edema          CBC-to be sure there is no infection  2. A. Fib - today the heart rate is controlled but irregular.  Plan Continue your present medication  Follow up with Dr. Clifton JamesMcAlhany as instructed  3. Hemorrhoids - any bleeding will be worse with being on blood thinner. Plan Do not strain at stooling (I know you don't)  Anusol HC 2.5% cream applied to the hemorrhoid twice a day after a sitz bath (alternatively:holding a tap water warm wash cloth against the hemorrhoid.)  4. Change in stool habit: may be poor bulking or it can be soft stool coming around a constipated colon. Plan A bulk laxative, e.g. Metamucil or benefiber once a day.  5. Peripheral edema - favoring the right leg. This does not appear to be a heart related or kidney related problem thus lasix is not the right treatment. Plan Elevate your legs higher than your hear - need to be laying down.  Knee high OTC working girl's stockings

## 2013-05-02 ENCOUNTER — Encounter: Payer: Self-pay | Admitting: Internal Medicine

## 2013-05-02 ENCOUNTER — Telehealth: Payer: Self-pay

## 2013-05-02 DIAGNOSIS — I10 Essential (primary) hypertension: Secondary | ICD-10-CM | POA: Diagnosis not present

## 2013-05-02 DIAGNOSIS — I4891 Unspecified atrial fibrillation: Secondary | ICD-10-CM | POA: Diagnosis not present

## 2013-05-02 DIAGNOSIS — R269 Unspecified abnormalities of gait and mobility: Secondary | ICD-10-CM | POA: Diagnosis not present

## 2013-05-02 DIAGNOSIS — J90 Pleural effusion, not elsewhere classified: Secondary | ICD-10-CM | POA: Diagnosis not present

## 2013-05-02 DIAGNOSIS — Z9981 Dependence on supplemental oxygen: Secondary | ICD-10-CM | POA: Diagnosis not present

## 2013-05-02 DIAGNOSIS — I509 Heart failure, unspecified: Secondary | ICD-10-CM | POA: Diagnosis not present

## 2013-05-02 NOTE — Telephone Encounter (Signed)
Patient daughter has been informed

## 2013-05-02 NOTE — Telephone Encounter (Signed)
Message copied by Noreene LarssonANDREWS, Kayann Maj R on Fri May 02, 2013  4:08 PM ------      Message from: Jacques NavyNORINS, MICHAEL E      Created: Fri May 02, 2013  3:53 PM       Lab results ok. Chest x-ray shows no significant change from previous study. No new orders at this time . Dr. Clifton JamesMcAlhany will have this information to review when you see him on the 31st. ------

## 2013-05-05 DIAGNOSIS — I10 Essential (primary) hypertension: Secondary | ICD-10-CM | POA: Diagnosis not present

## 2013-05-05 DIAGNOSIS — J90 Pleural effusion, not elsewhere classified: Secondary | ICD-10-CM | POA: Diagnosis not present

## 2013-05-05 DIAGNOSIS — I4891 Unspecified atrial fibrillation: Secondary | ICD-10-CM | POA: Diagnosis not present

## 2013-05-05 DIAGNOSIS — R269 Unspecified abnormalities of gait and mobility: Secondary | ICD-10-CM | POA: Diagnosis not present

## 2013-05-05 DIAGNOSIS — I509 Heart failure, unspecified: Secondary | ICD-10-CM | POA: Diagnosis not present

## 2013-05-05 DIAGNOSIS — Z9981 Dependence on supplemental oxygen: Secondary | ICD-10-CM | POA: Diagnosis not present

## 2013-05-05 NOTE — Assessment & Plan Note (Signed)
Hemorrhoids - any bleeding will be worse with being on blood thinner. Plan Do not strain at stooling (I know you don't)  Anusol HC 2.5% cream applied to the hemorrhoid twice a day after a sitz bath (alternatively:holding a tap water warm wash cloth against the hemorrhoid.)

## 2013-05-05 NOTE — Assessment & Plan Note (Addendum)
A. Fib - today the heart rate is controlled but irregular.  Plan Continue your present medication  Follow up with Dr. Clifton JamesMcAlhany as instructed

## 2013-05-05 NOTE — Assessment & Plan Note (Signed)
BP Readings from Last 3 Encounters:  05/01/13 122/60  04/02/13 130/62  03/21/13 110/50   Good control on present regimen.

## 2013-05-05 NOTE — Assessment & Plan Note (Signed)
Pulmonary - still with decreased breath sounds right worse than left but no wheezing or "rales." This suggests persistent pleural effusion but no fluid in the lung.   Plan Chest xray to check on the pleural effusion, to evaluate for atelectasis (incomplete expansion of the lung) and for pulmonary edema (fluid in the lung). Recommendations to follow, like changing lasix or doing incentive spirometry           Lab: BNP - to check for pulmonary edema          CBC-to be sure there is no infection  Addendum:  CXR -  COMPARISON: April 02, 2013  FINDINGS:  The heart size and mediastinal contours are stable. The heart size  is enlarged. The aorta is tortuous. There is moderate right pleural  effusion with consolidation of right mid and lung base unchanged.  The previously noted nodule in the left mid lung is not as well seen  as air is over penetration through the left lung on this film.  Evaluation of the left lung is very limited due to over penetrated  technique. The visualized skeletal structures are stable  IMPRESSION:  Moderate right pleural effusion with consolidation of the right mid  and lung base unchanged   CBC

## 2013-05-05 NOTE — Assessment & Plan Note (Signed)
Peripheral edema - favoring the right leg. This does not appear to be a heart related or kidney related problem thus lasix is not the right treatment. Plan Elevate your legs higher than your hear - need to be laying down.  Knee high OTC working girl's stockings

## 2013-05-06 ENCOUNTER — Encounter: Payer: Self-pay | Admitting: Cardiovascular Disease

## 2013-05-06 ENCOUNTER — Ambulatory Visit (INDEPENDENT_AMBULATORY_CARE_PROVIDER_SITE_OTHER): Payer: Medicare Other | Admitting: Pharmacist

## 2013-05-06 ENCOUNTER — Ambulatory Visit (INDEPENDENT_AMBULATORY_CARE_PROVIDER_SITE_OTHER): Payer: Medicare Other | Admitting: Cardiovascular Disease

## 2013-05-06 VITALS — BP 120/50 | HR 57 | Ht 66.0 in | Wt 143.0 lb

## 2013-05-06 DIAGNOSIS — I5032 Chronic diastolic (congestive) heart failure: Secondary | ICD-10-CM

## 2013-05-06 DIAGNOSIS — I509 Heart failure, unspecified: Secondary | ICD-10-CM | POA: Diagnosis not present

## 2013-05-06 DIAGNOSIS — I10 Essential (primary) hypertension: Secondary | ICD-10-CM

## 2013-05-06 DIAGNOSIS — I82409 Acute embolism and thrombosis of unspecified deep veins of unspecified lower extremity: Secondary | ICD-10-CM

## 2013-05-06 DIAGNOSIS — I5033 Acute on chronic diastolic (congestive) heart failure: Secondary | ICD-10-CM

## 2013-05-06 DIAGNOSIS — J90 Pleural effusion, not elsewhere classified: Secondary | ICD-10-CM

## 2013-05-06 DIAGNOSIS — I4891 Unspecified atrial fibrillation: Secondary | ICD-10-CM | POA: Diagnosis not present

## 2013-05-06 DIAGNOSIS — Z5181 Encounter for therapeutic drug level monitoring: Secondary | ICD-10-CM | POA: Diagnosis not present

## 2013-05-06 DIAGNOSIS — I4821 Permanent atrial fibrillation: Secondary | ICD-10-CM

## 2013-05-06 LAB — POCT INR: INR: 2

## 2013-05-06 MED ORDER — FUROSEMIDE 40 MG PO TABS: ORAL_TABLET | ORAL | Status: DC

## 2013-05-06 MED ORDER — POTASSIUM CHLORIDE CRYS ER 20 MEQ PO TBCR: 20.0000 meq | EXTENDED_RELEASE_TABLET | Freq: Two times a day (BID) | ORAL | Status: DC

## 2013-05-06 NOTE — Progress Notes (Signed)
History of Present Illness: 80 yo female with history of HTN, Hypothyroidism, paroxysmal atrial fibrillation, chronic diastolic CHF, pleural effusion here today for cardiac follow up.  She was admitted to [facility] 02/25/13 with c/o dyspnea, LE edema, cough. She was found to be volume overloaded and felt to have a pneumonia, found to have acute LE DVT, pleural effusion. She was diuresed with IV Lasix and treated for pneumonia. Echo with normal LV size and function, mild AS, mild AI, mild MR. She had rate controlled atrial fibrillation during the hospitalization. She was also found to have a DVT. She was started on Cardizem for rate control of atrial fib and coumadin for anti-coagulation given atrial fib and DVT. Pulmonary followed her in the hospital for a pleural effusion and performed diagnostic thoracentesis. No chest tube was placed. She was discharged to [facility] nursing facility. I saw her February 2015 and she had recurrence of lower ext edema. Her Lasix was increased.   She is here today for follow up. She is doing well overall. No chest pain. INR has been followed in her SNF.  Primary Care Physician: Norins  Past Medical History  Diagnosis Date  . Atrial fibrillation     arrythmia  . HTN (hypertension)   . Familial tremor   . Allergic rhinitis, cause unspecified 01/25/2011  . Thyroid disease   . Chronic diastolic CHF (congestive heart failure)     Past Surgical History  Procedure Laterality Date  . Tonsillectomy    . Dilation and curettage of uterus    . Bunionectomy    . Laparoscopic ovarian cystectomy  04/2009    Current Outpatient Prescriptions  Medication Sig Dispense Refill  . aspirin 325 MG tablet Take 325 mg by mouth daily.       . Calcium Carbonate-Vitamin D (CALTRATE 600+D) 600-400 MG-UNIT per tablet Take 1 tablet by mouth 2 (two) times daily.       Marland Kitchen diltiazem (CARDIZEM CD) 120 MG 24 hr capsule Take 1 capsule (120 mg total) by mouth daily.      Marland Kitchen docusate sodium  (COLACE) 100 MG capsule Take 1 capsule (100 mg total) by mouth as needed. Stool Softener  10 capsule  3  . furosemide (LASIX) 40 MG tablet Take 1 tablet (40 mg total) by mouth 2 (two) times daily.  60 tablet  0  . hydrocortisone (ANUSOL-HC) 2.5 % rectal cream Place 1 application rectally 2 (two) times daily.  30 g  2  . labetalol (NORMODYNE) 200 MG tablet Take 200 mg by mouth 2 (two) times daily.      . methimazole (TAPAZOLE) 5 MG tablet Take 5 mg by mouth daily.      . potassium chloride (KLOR-CON) 20 MEQ packet Take 20 mEq by mouth once.      . warfarin (COUMADIN) 5 MG tablet Take 1 tablet (5 mg total) by mouth daily. Adjust according to INR.  15 tablet  0  . [DISCONTINUED] diltiazem (TIAZAC) 180 MG 24 hr capsule Take 1 capsule (180 mg total) by mouth daily.  30 capsule  11   No current facility-administered medications for this visit.    No Known Allergies  History   Social History  . Marital Status: Widowed    Spouse Name: N/A    Number of Children: N/A  . Years of Education: N/A   Occupational History  . Not on file.   Social History Main Topics  . Smoking status: Never Smoker   . Smokeless tobacco:  Never Used  . Alcohol Use: No  . Drug Use: No  . Sexual Activity: Not on file   Other Topics Concern  . Not on file   Social History Narrative   [employer]. 1 year business school. Married 1948-widowed 1981. 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 [place] [facility] [place] '[place]living with daughter. Terminal care-does Not want cardiac resuscitation or mechanical ventilation, artifical nutrition or hydration, renal dialysis or major heroic intervention. Laymen's Guide with forms provided.    Family History  Problem Relation Age of Onset  . Pancreatic cancer Mother   . Coronary artery disease Father   . Diabetes Sister   . Diabetes Brother     Review of Systems:  As stated in the HPI and otherwise negative.   BP 120/50   Pulse 57  Ht 5\' 6"  (1.676 m)  Wt 143 lb (64.864 kg)  BMI 23.09 kg/m2  Physical Examination: General: Well developed, well nourished, NAD HEENT: OP clear, mucus membranes moist SKIN: warm, dry. No rashes. Neuro: No focal deficits Musculoskeletal: Muscle strength 5/5 all ext Psychiatric: Mood and affect normal Neck: No JVD, no carotid bruits, no thyromegaly, no lymphadenopathy. Lungs:Clear bilaterally, no wheezes, rhonci, crackles Cardiovascular: Irregular irregular No murmurs, gallops or rubs. Abdomen:Soft. Bowel sounds present. Non-tender.  Extremities: 2+ bilateral lower extremity edema.   Echo 02/26/13: Left ventricle: The cavity size was normal. Wall thickness was increased in a pattern of mild LVH. Systolic function was normal. The estimated ejection fraction was in the range of 60% to 65%. Wall motion was normal; there were no regional wall motion abnormalities. - Aortic valve: There was mild stenosis. Mild regurgitation. Valve area: 1.06cm^2(VTI). Valve area: 0.98cm^2 (Vmax). - Mitral valve: Mild regurgitation. - Left atrium: The atrium was moderately dilated. - Right atrium: The atrium was moderately dilated. - Tricuspid valve: Moderate regurgitation. - Pulmonary arteries: PA peak pressure: 42mm Hg (S). - Pericardium, extracardiac: A trivial pericardial effusion was identified posterior to the heart.  Assessment and Plan:   1. Atrial fibrillation: Rate is controlled. Continue Cardizem.  She has been anti-coagulated with coumadin. Her INR is being followed in primary care office. No plans for cardioversion.   2. Chronic diastolic CHF: Will continue Lasix 40 mg daily alternating with 80 mg on every other day  3. HTN: BP controlled.   4. DVT: Continue coumadin.   5. Pleural effusion: She has been followed in the pulmonary office.

## 2013-05-06 NOTE — Patient Instructions (Signed)
Your physician wants you to follow-up in:  6 months.  You will receive a reminder letter in the mail two months in advance. If you don't receive a letter, please call our office to schedule the follow-up appointment.  Your physician has recommended you make the following change in your medication:   Take furosemide 40 mg alternating with 80 mg by mouth daily. Take potassium 20 meq by mouth twice daily

## 2013-05-07 DIAGNOSIS — R269 Unspecified abnormalities of gait and mobility: Secondary | ICD-10-CM | POA: Diagnosis not present

## 2013-05-07 DIAGNOSIS — I509 Heart failure, unspecified: Secondary | ICD-10-CM | POA: Diagnosis not present

## 2013-05-07 DIAGNOSIS — I10 Essential (primary) hypertension: Secondary | ICD-10-CM | POA: Diagnosis not present

## 2013-05-07 DIAGNOSIS — I4891 Unspecified atrial fibrillation: Secondary | ICD-10-CM | POA: Diagnosis not present

## 2013-05-07 DIAGNOSIS — J90 Pleural effusion, not elsewhere classified: Secondary | ICD-10-CM | POA: Diagnosis not present

## 2013-05-07 DIAGNOSIS — Z9981 Dependence on supplemental oxygen: Secondary | ICD-10-CM | POA: Diagnosis not present

## 2013-05-09 DIAGNOSIS — I10 Essential (primary) hypertension: Secondary | ICD-10-CM | POA: Diagnosis not present

## 2013-05-09 DIAGNOSIS — Z9981 Dependence on supplemental oxygen: Secondary | ICD-10-CM | POA: Diagnosis not present

## 2013-05-09 DIAGNOSIS — I509 Heart failure, unspecified: Secondary | ICD-10-CM | POA: Diagnosis not present

## 2013-05-09 DIAGNOSIS — J90 Pleural effusion, not elsewhere classified: Secondary | ICD-10-CM | POA: Diagnosis not present

## 2013-05-09 DIAGNOSIS — I4891 Unspecified atrial fibrillation: Secondary | ICD-10-CM | POA: Diagnosis not present

## 2013-05-09 DIAGNOSIS — R269 Unspecified abnormalities of gait and mobility: Secondary | ICD-10-CM | POA: Diagnosis not present

## 2013-05-13 DIAGNOSIS — R269 Unspecified abnormalities of gait and mobility: Secondary | ICD-10-CM | POA: Diagnosis not present

## 2013-05-13 DIAGNOSIS — I4891 Unspecified atrial fibrillation: Secondary | ICD-10-CM | POA: Diagnosis not present

## 2013-05-13 DIAGNOSIS — I10 Essential (primary) hypertension: Secondary | ICD-10-CM | POA: Diagnosis not present

## 2013-05-13 DIAGNOSIS — Z9981 Dependence on supplemental oxygen: Secondary | ICD-10-CM | POA: Diagnosis not present

## 2013-05-13 DIAGNOSIS — I509 Heart failure, unspecified: Secondary | ICD-10-CM | POA: Diagnosis not present

## 2013-05-13 DIAGNOSIS — J90 Pleural effusion, not elsewhere classified: Secondary | ICD-10-CM | POA: Diagnosis not present

## 2013-05-15 DIAGNOSIS — Z9981 Dependence on supplemental oxygen: Secondary | ICD-10-CM | POA: Diagnosis not present

## 2013-05-15 DIAGNOSIS — I4891 Unspecified atrial fibrillation: Secondary | ICD-10-CM | POA: Diagnosis not present

## 2013-05-15 DIAGNOSIS — I509 Heart failure, unspecified: Secondary | ICD-10-CM | POA: Diagnosis not present

## 2013-05-15 DIAGNOSIS — I10 Essential (primary) hypertension: Secondary | ICD-10-CM | POA: Diagnosis not present

## 2013-05-15 DIAGNOSIS — R269 Unspecified abnormalities of gait and mobility: Secondary | ICD-10-CM | POA: Diagnosis not present

## 2013-05-15 DIAGNOSIS — J90 Pleural effusion, not elsewhere classified: Secondary | ICD-10-CM | POA: Diagnosis not present

## 2013-05-16 DIAGNOSIS — I509 Heart failure, unspecified: Secondary | ICD-10-CM | POA: Diagnosis not present

## 2013-05-16 DIAGNOSIS — R269 Unspecified abnormalities of gait and mobility: Secondary | ICD-10-CM | POA: Diagnosis not present

## 2013-05-16 DIAGNOSIS — Z9981 Dependence on supplemental oxygen: Secondary | ICD-10-CM

## 2013-05-16 DIAGNOSIS — J90 Pleural effusion, not elsewhere classified: Secondary | ICD-10-CM

## 2013-05-16 DIAGNOSIS — I4891 Unspecified atrial fibrillation: Secondary | ICD-10-CM

## 2013-05-16 DIAGNOSIS — I10 Essential (primary) hypertension: Secondary | ICD-10-CM | POA: Diagnosis not present

## 2013-05-19 ENCOUNTER — Ambulatory Visit (INDEPENDENT_AMBULATORY_CARE_PROVIDER_SITE_OTHER): Payer: Medicare Other | Admitting: Emergency Medicine

## 2013-05-19 ENCOUNTER — Encounter: Payer: Self-pay | Admitting: Emergency Medicine

## 2013-05-19 VITALS — BP 118/78 | HR 60 | Ht 67.5 in | Wt 138.0 lb

## 2013-05-19 DIAGNOSIS — Z9981 Dependence on supplemental oxygen: Secondary | ICD-10-CM | POA: Diagnosis not present

## 2013-05-19 DIAGNOSIS — R269 Unspecified abnormalities of gait and mobility: Secondary | ICD-10-CM | POA: Diagnosis not present

## 2013-05-19 DIAGNOSIS — I509 Heart failure, unspecified: Secondary | ICD-10-CM | POA: Diagnosis not present

## 2013-05-19 DIAGNOSIS — J90 Pleural effusion, not elsewhere classified: Secondary | ICD-10-CM

## 2013-05-19 DIAGNOSIS — I10 Essential (primary) hypertension: Secondary | ICD-10-CM | POA: Diagnosis not present

## 2013-05-19 DIAGNOSIS — I4891 Unspecified atrial fibrillation: Secondary | ICD-10-CM | POA: Diagnosis not present

## 2013-05-19 MED ORDER — POTASSIUM CHLORIDE CRYS ER 20 MEQ PO TBCR: 20.0000 meq | EXTENDED_RELEASE_TABLET | Freq: Two times a day (BID) | ORAL | Status: DC

## 2013-05-19 NOTE — Assessment & Plan Note (Signed)
R effusion is stable in size, was likely parapneumonic. At this point would not recommend any procedure - she might require VATS to clear the pleural space. The risks of this seem to outweigh the benefits. Will follow her conservatively unless her breathing changes.

## 2013-05-19 NOTE — Patient Instructions (Signed)
We will not plan on any procedure at this time If your breathing worsens in any way you will need a repeat CXR Follow with Dr Delton CoombesByrum as needed, especially if you become more short of breath.

## 2013-05-19 NOTE — Progress Notes (Signed)
Subjective:    Patient ID: Danielle Adams, female    DOB: [birth year], 80 y.o.   MRN: [id number]  HPI Pleasant 80 yo woman. She is s/p hosp for R PNA, parapneumonic effusion. She is recovering, doing [facility]. She still has some cough, sometimes associated with drinking H2O. She was sent home on 4L/min.   ROV 05/19/13 -- follows up for recent PNA and parapneumonic effusion on R. Her CXR was repeated 3/26 and shows fluid is stable in size. She feels better, has been working on her cardiopulmonary stamina and conditioning. .    Review of Systems  Constitutional: Positive for unexpected weight change. Negative for fever.  HENT: Positive for congestion. Negative for dental problem, ear pain, nosebleeds, postnasal drip, rhinorrhea, sinus pressure, sneezing, sore throat and trouble swallowing.   Eyes: Negative for redness and itching.  Respiratory: Positive for cough. Negative for chest tightness, shortness of breath and wheezing.   Cardiovascular: Negative for palpitations and leg swelling.  Gastrointestinal: Negative for nausea and vomiting.  Genitourinary: Negative for dysuria.  Musculoskeletal: Negative for joint swelling.  Skin: Negative for rash.  Neurological: Negative for headaches.  Hematological: Does not bruise/bleed easily.  Psychiatric/Behavioral: Negative for dysphoric mood. The patient is not nervous/anxious.    Past Medical History  Diagnosis Date  . Atrial fibrillation     arrythmia  . HTN (hypertension)   . Familial tremor   . Allergic rhinitis, cause unspecified 01/25/2011  . Thyroid disease   . Chronic diastolic CHF (congestive heart failure)      Family History  Problem Relation Age of Onset  . Pancreatic cancer Mother   . Coronary artery disease Father   . Diabetes Sister   . Diabetes Brother      History   Social History  . Marital Status: Widowed    Spouse Name: N/A    Number of Children: N/A  . Years of Education: N/A   Occupational  History  . Not on file.   Social History Main Topics  . Smoking status: Never Smoker   . Smokeless tobacco: Never Used  . Alcohol Use: No  . Drug Use: No  . Sexual Activity: Not on file   Other Topics Concern  . Not on file   Social History Narrative   [employer]. 1 year business school. Married 1948-widowed 1981. 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 [place]living with daughter. Terminal care-does Not want cardiac resuscitation or mechanical ventilation, artifical nutrition or hydration, renal dialysis or major heroic intervention. Laymen's Guide with forms provided.     No Known Allergies   Outpatient Prescriptions Prior to Visit  Medication Sig Dispense Refill  . aspirin 325 MG tablet Take 325 mg by mouth daily.       . Calcium Carbonate-Vitamin D (CALTRATE 600+D) 600-400 MG-UNIT per tablet Take 1 tablet by mouth 2 (two) times daily.       Marland Kitchen. diltiazem (CARDIZEM CD) 120 MG 24 hr capsule Take 1 capsule (120 mg total) by mouth daily.      . furosemide (LASIX) 40 MG tablet Take 40 mg alternating with 80 mg by mouth daily  45 tablet  6  . hydrocortisone (ANUSOL-HC) 2.5 % rectal cream Place 1 application rectally 2 (two) times daily.  30 g  2  . labetalol (NORMODYNE) 200 MG tablet Take 200 mg by mouth 2 (two) times daily.      . methimazole (TAPAZOLE) 5 MG tablet Take  5 mg by mouth daily.      Marland Kitchen. warfarin (COUMADIN) 5 MG tablet Take 1 tablet (5 mg total) by mouth daily. Adjust according to INR.  15 tablet  0  . potassium chloride SA (K-DUR,KLOR-CON) 20 MEQ tablet Take 1 tablet (20 mEq total) by mouth 2 (two) times daily.  60 tablet  6   No facility-administered medications prior to visit.          Objective:   Physical Exam Filed Vitals:   05/19/13 1344  BP: 118/78  Pulse: 60  Height: 5' 7.5" (1.715 m)  Weight: 138 lb (62.596 kg)  SpO2: 95%   Gen: Pleasant elderly woman, some tremor, in no distress,   normal affect  ENT: No lesions,  mouth clear,  oropharynx clear, no postnasal drip  Neck: No JVD, no TMG, no carotid bruits  Lungs: No use of accessory muscles, clear without rales or rhonchi, decreased at the R base  Cardiovascular: RRR, heart sounds normal, no murmur or gallops, no peripheral edema  Musculoskeletal: No deformities, no cyanosis or clubbing  Neuro: alert, non focal  Skin: Warm, no lesions or rashes      Assessment & Plan:  Pleural effusion R effusion is stable in size, was likely parapneumonic. At this point would not recommend any procedure - she might require VATS to clear the pleural space. The risks of this seem to outweigh the benefits. Will follow her conservatively unless her breathing changes.

## 2013-05-20 ENCOUNTER — Encounter (INDEPENDENT_AMBULATORY_CARE_PROVIDER_SITE_OTHER): Payer: Medicare Other | Admitting: General Practice

## 2013-05-20 ENCOUNTER — Other Ambulatory Visit: Payer: Self-pay | Admitting: General Practice

## 2013-05-20 ENCOUNTER — Ambulatory Visit (INDEPENDENT_AMBULATORY_CARE_PROVIDER_SITE_OTHER): Payer: Medicare Other | Admitting: General Practice

## 2013-05-20 DIAGNOSIS — I82409 Acute embolism and thrombosis of unspecified deep veins of unspecified lower extremity: Secondary | ICD-10-CM

## 2013-05-20 DIAGNOSIS — J90 Pleural effusion, not elsewhere classified: Secondary | ICD-10-CM | POA: Diagnosis not present

## 2013-05-20 DIAGNOSIS — Z5181 Encounter for therapeutic drug level monitoring: Secondary | ICD-10-CM

## 2013-05-20 LAB — POCT INR: INR: 1.8

## 2013-05-20 MED ORDER — WARFARIN SODIUM 1 MG PO TABS: ORAL_TABLET | ORAL | Status: DC

## 2013-05-20 NOTE — Progress Notes (Signed)
Pre visit review using our clinic review tool, if applicable. No additional management support is needed unless otherwise documented below in the visit note. 

## 2013-05-23 DIAGNOSIS — R269 Unspecified abnormalities of gait and mobility: Secondary | ICD-10-CM | POA: Diagnosis not present

## 2013-05-23 DIAGNOSIS — I10 Essential (primary) hypertension: Secondary | ICD-10-CM | POA: Diagnosis not present

## 2013-05-23 DIAGNOSIS — J90 Pleural effusion, not elsewhere classified: Secondary | ICD-10-CM | POA: Diagnosis not present

## 2013-05-23 DIAGNOSIS — Z9981 Dependence on supplemental oxygen: Secondary | ICD-10-CM | POA: Diagnosis not present

## 2013-05-23 DIAGNOSIS — I4891 Unspecified atrial fibrillation: Secondary | ICD-10-CM | POA: Diagnosis not present

## 2013-05-23 DIAGNOSIS — I509 Heart failure, unspecified: Secondary | ICD-10-CM | POA: Diagnosis not present

## 2013-05-26 ENCOUNTER — Telehealth: Payer: Self-pay | Admitting: *Deleted

## 2013-05-26 DIAGNOSIS — I509 Heart failure, unspecified: Secondary | ICD-10-CM | POA: Diagnosis not present

## 2013-05-26 DIAGNOSIS — I10 Essential (primary) hypertension: Secondary | ICD-10-CM | POA: Diagnosis not present

## 2013-05-26 DIAGNOSIS — J90 Pleural effusion, not elsewhere classified: Secondary | ICD-10-CM | POA: Diagnosis not present

## 2013-05-26 DIAGNOSIS — R269 Unspecified abnormalities of gait and mobility: Secondary | ICD-10-CM | POA: Diagnosis not present

## 2013-05-26 DIAGNOSIS — Z9981 Dependence on supplemental oxygen: Secondary | ICD-10-CM | POA: Diagnosis not present

## 2013-05-26 DIAGNOSIS — I4891 Unspecified atrial fibrillation: Secondary | ICD-10-CM | POA: Diagnosis not present

## 2013-05-26 NOTE — Telephone Encounter (Signed)
Verbal ok for PT as requested

## 2013-05-26 NOTE — Telephone Encounter (Signed)
Danielle Adams from PT called on Friday requesting 4 additional weeks of PT for balance, strengthening, and mobility.  Please advise

## 2013-05-26 NOTE — Telephone Encounter (Signed)
Spoke with Marcelino DusterMichelle advised of MDs message

## 2013-05-28 DIAGNOSIS — I4891 Unspecified atrial fibrillation: Secondary | ICD-10-CM | POA: Diagnosis not present

## 2013-05-28 DIAGNOSIS — J90 Pleural effusion, not elsewhere classified: Secondary | ICD-10-CM | POA: Diagnosis not present

## 2013-05-28 DIAGNOSIS — I509 Heart failure, unspecified: Secondary | ICD-10-CM | POA: Diagnosis not present

## 2013-05-28 DIAGNOSIS — R269 Unspecified abnormalities of gait and mobility: Secondary | ICD-10-CM | POA: Diagnosis not present

## 2013-05-28 DIAGNOSIS — I10 Essential (primary) hypertension: Secondary | ICD-10-CM | POA: Diagnosis not present

## 2013-05-28 DIAGNOSIS — Z9981 Dependence on supplemental oxygen: Secondary | ICD-10-CM | POA: Diagnosis not present

## 2013-05-30 ENCOUNTER — Ambulatory Visit (INDEPENDENT_AMBULATORY_CARE_PROVIDER_SITE_OTHER): Payer: Medicare Other | Admitting: General Practice

## 2013-05-30 DIAGNOSIS — R269 Unspecified abnormalities of gait and mobility: Secondary | ICD-10-CM | POA: Diagnosis not present

## 2013-05-30 DIAGNOSIS — I4891 Unspecified atrial fibrillation: Secondary | ICD-10-CM | POA: Diagnosis not present

## 2013-05-30 DIAGNOSIS — J90 Pleural effusion, not elsewhere classified: Secondary | ICD-10-CM

## 2013-05-30 DIAGNOSIS — Z5181 Encounter for therapeutic drug level monitoring: Secondary | ICD-10-CM

## 2013-05-30 DIAGNOSIS — I10 Essential (primary) hypertension: Secondary | ICD-10-CM | POA: Diagnosis not present

## 2013-05-30 DIAGNOSIS — I509 Heart failure, unspecified: Secondary | ICD-10-CM | POA: Diagnosis not present

## 2013-05-30 DIAGNOSIS — I82409 Acute embolism and thrombosis of unspecified deep veins of unspecified lower extremity: Secondary | ICD-10-CM | POA: Diagnosis not present

## 2013-05-30 DIAGNOSIS — Z9981 Dependence on supplemental oxygen: Secondary | ICD-10-CM | POA: Diagnosis not present

## 2013-05-30 LAB — POCT INR: INR: 2.6

## 2013-05-30 NOTE — Progress Notes (Signed)
Pre visit review using our clinic review tool, if applicable. No additional management support is needed unless otherwise documented below in the visit note. 

## 2013-06-10 DIAGNOSIS — J90 Pleural effusion, not elsewhere classified: Secondary | ICD-10-CM | POA: Diagnosis not present

## 2013-06-10 DIAGNOSIS — R269 Unspecified abnormalities of gait and mobility: Secondary | ICD-10-CM | POA: Diagnosis not present

## 2013-06-10 DIAGNOSIS — Z9981 Dependence on supplemental oxygen: Secondary | ICD-10-CM | POA: Diagnosis not present

## 2013-06-10 DIAGNOSIS — I4891 Unspecified atrial fibrillation: Secondary | ICD-10-CM | POA: Diagnosis not present

## 2013-06-10 DIAGNOSIS — I10 Essential (primary) hypertension: Secondary | ICD-10-CM | POA: Diagnosis not present

## 2013-06-10 DIAGNOSIS — I509 Heart failure, unspecified: Secondary | ICD-10-CM | POA: Diagnosis not present

## 2013-06-11 DIAGNOSIS — Z9981 Dependence on supplemental oxygen: Secondary | ICD-10-CM | POA: Diagnosis not present

## 2013-06-11 DIAGNOSIS — I10 Essential (primary) hypertension: Secondary | ICD-10-CM | POA: Diagnosis not present

## 2013-06-11 DIAGNOSIS — I509 Heart failure, unspecified: Secondary | ICD-10-CM | POA: Diagnosis not present

## 2013-06-11 DIAGNOSIS — R269 Unspecified abnormalities of gait and mobility: Secondary | ICD-10-CM | POA: Diagnosis not present

## 2013-06-11 DIAGNOSIS — I4891 Unspecified atrial fibrillation: Secondary | ICD-10-CM | POA: Diagnosis not present

## 2013-06-11 DIAGNOSIS — J90 Pleural effusion, not elsewhere classified: Secondary | ICD-10-CM | POA: Diagnosis not present

## 2013-06-12 DIAGNOSIS — R269 Unspecified abnormalities of gait and mobility: Secondary | ICD-10-CM | POA: Diagnosis not present

## 2013-06-12 DIAGNOSIS — I10 Essential (primary) hypertension: Secondary | ICD-10-CM | POA: Diagnosis not present

## 2013-06-12 DIAGNOSIS — Z9981 Dependence on supplemental oxygen: Secondary | ICD-10-CM | POA: Diagnosis not present

## 2013-06-12 DIAGNOSIS — I509 Heart failure, unspecified: Secondary | ICD-10-CM | POA: Diagnosis not present

## 2013-06-12 DIAGNOSIS — I4891 Unspecified atrial fibrillation: Secondary | ICD-10-CM | POA: Diagnosis not present

## 2013-06-12 DIAGNOSIS — J90 Pleural effusion, not elsewhere classified: Secondary | ICD-10-CM | POA: Diagnosis not present

## 2013-06-16 DIAGNOSIS — J90 Pleural effusion, not elsewhere classified: Secondary | ICD-10-CM | POA: Diagnosis not present

## 2013-06-16 DIAGNOSIS — I4891 Unspecified atrial fibrillation: Secondary | ICD-10-CM | POA: Diagnosis not present

## 2013-06-16 DIAGNOSIS — I10 Essential (primary) hypertension: Secondary | ICD-10-CM | POA: Diagnosis not present

## 2013-06-16 DIAGNOSIS — Z9981 Dependence on supplemental oxygen: Secondary | ICD-10-CM | POA: Diagnosis not present

## 2013-06-16 DIAGNOSIS — I509 Heart failure, unspecified: Secondary | ICD-10-CM | POA: Diagnosis not present

## 2013-06-16 DIAGNOSIS — R269 Unspecified abnormalities of gait and mobility: Secondary | ICD-10-CM | POA: Diagnosis not present

## 2013-06-17 DIAGNOSIS — I4891 Unspecified atrial fibrillation: Secondary | ICD-10-CM | POA: Diagnosis not present

## 2013-06-17 DIAGNOSIS — I509 Heart failure, unspecified: Secondary | ICD-10-CM | POA: Diagnosis not present

## 2013-06-17 DIAGNOSIS — J90 Pleural effusion, not elsewhere classified: Secondary | ICD-10-CM | POA: Diagnosis not present

## 2013-06-17 DIAGNOSIS — R269 Unspecified abnormalities of gait and mobility: Secondary | ICD-10-CM | POA: Diagnosis not present

## 2013-06-17 DIAGNOSIS — Z9981 Dependence on supplemental oxygen: Secondary | ICD-10-CM | POA: Diagnosis not present

## 2013-06-17 DIAGNOSIS — I10 Essential (primary) hypertension: Secondary | ICD-10-CM | POA: Diagnosis not present

## 2013-06-20 ENCOUNTER — Other Ambulatory Visit: Payer: Self-pay | Admitting: *Deleted

## 2013-06-20 MED ORDER — LABETALOL HCL 200 MG PO TABS: 200.0000 mg | ORAL_TABLET | Freq: Two times a day (BID) | ORAL | Status: DC

## 2013-06-21 DIAGNOSIS — J90 Pleural effusion, not elsewhere classified: Secondary | ICD-10-CM | POA: Diagnosis not present

## 2013-06-21 DIAGNOSIS — Z9981 Dependence on supplemental oxygen: Secondary | ICD-10-CM | POA: Diagnosis not present

## 2013-06-21 DIAGNOSIS — I509 Heart failure, unspecified: Secondary | ICD-10-CM | POA: Diagnosis not present

## 2013-06-21 DIAGNOSIS — R269 Unspecified abnormalities of gait and mobility: Secondary | ICD-10-CM | POA: Diagnosis not present

## 2013-06-21 DIAGNOSIS — I10 Essential (primary) hypertension: Secondary | ICD-10-CM | POA: Diagnosis not present

## 2013-06-21 DIAGNOSIS — I4891 Unspecified atrial fibrillation: Secondary | ICD-10-CM | POA: Diagnosis not present

## 2013-06-27 ENCOUNTER — Ambulatory Visit (INDEPENDENT_AMBULATORY_CARE_PROVIDER_SITE_OTHER): Payer: Medicare Other | Admitting: General Practice

## 2013-06-27 DIAGNOSIS — I82409 Acute embolism and thrombosis of unspecified deep veins of unspecified lower extremity: Secondary | ICD-10-CM | POA: Diagnosis not present

## 2013-06-27 DIAGNOSIS — J90 Pleural effusion, not elsewhere classified: Secondary | ICD-10-CM | POA: Diagnosis not present

## 2013-06-27 DIAGNOSIS — Z5181 Encounter for therapeutic drug level monitoring: Secondary | ICD-10-CM | POA: Diagnosis not present

## 2013-06-27 LAB — POCT INR: INR: 1.9

## 2013-06-27 NOTE — Progress Notes (Signed)
Pre visit review using our clinic review tool, if applicable. No additional management support is needed unless otherwise documented below in the visit note. 

## 2013-07-29 ENCOUNTER — Ambulatory Visit (INDEPENDENT_AMBULATORY_CARE_PROVIDER_SITE_OTHER): Payer: Medicare Other | Admitting: Family Medicine

## 2013-07-29 DIAGNOSIS — Z5181 Encounter for therapeutic drug level monitoring: Secondary | ICD-10-CM

## 2013-07-29 DIAGNOSIS — J90 Pleural effusion, not elsewhere classified: Secondary | ICD-10-CM | POA: Diagnosis not present

## 2013-07-29 DIAGNOSIS — I82409 Acute embolism and thrombosis of unspecified deep veins of unspecified lower extremity: Secondary | ICD-10-CM | POA: Diagnosis not present

## 2013-07-29 LAB — POCT INR: INR: 1.7

## 2013-08-20 ENCOUNTER — Ambulatory Visit (INDEPENDENT_AMBULATORY_CARE_PROVIDER_SITE_OTHER): Payer: Medicare Other | Admitting: General Practice

## 2013-08-20 DIAGNOSIS — J90 Pleural effusion, not elsewhere classified: Secondary | ICD-10-CM

## 2013-08-20 DIAGNOSIS — Z5181 Encounter for therapeutic drug level monitoring: Secondary | ICD-10-CM | POA: Diagnosis not present

## 2013-08-20 DIAGNOSIS — I82409 Acute embolism and thrombosis of unspecified deep veins of unspecified lower extremity: Secondary | ICD-10-CM | POA: Diagnosis not present

## 2013-08-20 LAB — POCT INR: INR: 2

## 2013-08-20 NOTE — Progress Notes (Signed)
Pre visit review using our clinic review tool, if applicable. No additional management support is needed unless otherwise documented below in the visit note. 

## 2013-08-26 ENCOUNTER — Other Ambulatory Visit: Payer: Self-pay

## 2013-08-26 MED ORDER — DILTIAZEM HCL ER COATED BEADS 120 MG PO CP24: 120.0000 mg | ORAL_CAPSULE | Freq: Every day | ORAL | Status: DC

## 2013-08-29 ENCOUNTER — Encounter: Payer: Self-pay | Admitting: Physician Assistant

## 2013-08-29 ENCOUNTER — Ambulatory Visit (INDEPENDENT_AMBULATORY_CARE_PROVIDER_SITE_OTHER): Payer: Medicare Other | Admitting: Physician Assistant

## 2013-08-29 VITALS — BP 128/70 | HR 66 | Temp 97.8°F | Resp 18 | Wt 140.0 lb

## 2013-08-29 DIAGNOSIS — M542 Cervicalgia: Secondary | ICD-10-CM | POA: Diagnosis not present

## 2013-08-29 NOTE — Patient Instructions (Addendum)
Try alternating between heat and ice to help neck pain symptoms.  If emergency symptoms discussed during visit developed, seek medical attention immediately.  Followup as needed, or for worsening or persistent symptoms despite treatment.    Patient information: Neck pain (The Basics) Written by the doctors and editors at UpToDate What can cause neck pain? - Neck pain happens when there is a problem with or injury to any of the parts ("structures") in the neck (figure 1). The structures in the neck include:  ?Bones - The neck has 7 bones that are stacked on top of each other. These bones make up the top part of the spine and are called the "cervical vertebrae." Neck pain can happen when the bones get worn down or develop abnormal growths (called "bone spurs"). ?Ligaments - Ligaments are strong tissues that connect bones to other bones. Ligament damage can happen when the neck moves back and forth suddenly (called "whiplash"), such as in a car accident. ?Discs - Discs are cushions that sit between the bones. When the discs change shape or move out of position, people can have symptoms. ?Muscles - Muscles hold the head up and make the neck move. Neck pain can be caused by muscle strain or tension, such as from poor posture or stress. ?Nerves - A bundle of nerves (called the spinal cord) travels down the middle of the spine. Nerves branch off from the spinal cord to all parts of the body. People can have symptoms if their nerves are irritated or pushed on by nearby bones or discs. What symptoms can people with neck pain have? - People can have different symptoms that include:  ?Pain, stiffness, or tightness in the neck, shoulders, upper back, or arms ?Headaches ?Neck weakness ?Being unable to move or turn the neck ?Pain when turning or tilting the head ?Numbness or strange feelings (such as pins and needles) in the shoulders or arms ?Trouble walking or moving the legs ?Having no control over  the bladder or bowels Should I see a doctor or nurse? - You should see a doctor or nurse if you have:  ?A severe injury to your head or neck ?Severe pain ?No control over your bladder or bowels ?Numbness or weakness in your arms or legs ?Pain that doesn't get better after you treat it at home for 1 week Do I need to have tests? - Most people do not need any tests. Your doctor or nurse will do an exam. He or she will feel your muscles and check how your head and neck move.  But some people might need tests. Tests can include:  ?X-ray, CT scan, MRI scan, or other imaging tests - Imaging tests create pictures of the inside of the body. ?Muscle or nerve tests to see if the muscles and nerves work normally Is there anything I can do on my own to feel better? - Yes. To reduce your symptoms, you can:  ?Take a pain-relieving medicine ?Massage the muscles that are tight or tense ?Put ice on the area to reduce pain - You can rub ice on the area for 5 to 7 minutes. Or you can put a frozen bag of peas or a cold gel pack on the area for 20 minutes at a time, a few times a day. ?Put heat on the area to reduce pain and stiffness - Take a hot shower or hot bath, or put a hot towel on the area. Don't use heat for more than 20 minutes at a  time. Don't use anything too hot that could burn your skin. ?Do neck exercises - Different exercises can stretch the neck, shoulder, and back muscles and help make them stronger. Ask your doctor or nurse if you should do exercises, and which ones can help your symptoms. ?Reduce stress - Stress can make pain worse and prevent symptoms from getting better. Try to reduce your stress. You can ask your doctor or nurse about exercises that can help you relax. ?Watch your posture - Try to keep your neck straight in line with your body and avoid activities that involve a lot of neck movement. When you sleep, keep your head and neck in line with your body. Try to avoid sleeping on your  stomach with your head turned to one side. What other treatments might I have? - Your doctor or nurse can use other treatments if your neck pain doesn't improve after you treat it at home. For example, he or she might suggest that you see an exercise expert, called a physical therapist. Or your doctor can inject a numbing medicine into your neck.  Can neck pain be prevented? - To help prevent neck pain, you can:  ?Use good posture - Hold your head up and keep your shoulders down. ?Avoid sitting in the same position for too long ?Avoid doing work above your head for too long ?Avoid putting weight or pressure on your upper back ?Keep your neck in line with the rest of your body when you sleep More on this topic  Patient information: Headache (The Basics) Patient information: Low back pain in adults (The Basics) Patient information: Whiplash (The Basics) Patient information: Neck pain (Beyond the Basics) Patient information: Neck fracture (The Basics)  All topics are updated as new evidence becomes available and our peer review process is complete. This topic retrieved from UpToDate on: Aug 29, 2013. The content on the UpToDate website is not intended nor recommended as a substitute for medical advice, diagnosis, or treatment. Always seek the advice of your own physician or other qualified health care professional regarding any medical questions or conditions. The use of UpToDate content is governed by the UpToDate Terms of Use. 2015 UpToDate, Inc. All rights reserved. Topic 15793 Version 7.0 GRAPHICS  Anatomy of the neck Image Graphic 1914772120 Version 2.0

## 2013-08-29 NOTE — Progress Notes (Signed)
Subjective:    Patient ID: Danielle Adams, female    DOB: 11-10-1916, 80 y.o.   MRN: [id number]  Neck Pain  This is a new problem. The current episode started in the past 7 days. The problem occurs constantly. The problem has been gradually improving. Associated with: thought she may have been bitten by something. The pain is present in the right side (no radiation of pain). The quality of the pain is described as aching. The pain is mild. The symptoms are aggravated by twisting and bending. Associated symptoms include headaches. Pertinent negatives include no chest pain, fever, leg pain, numbness, pain with swallowing, paresis, photophobia, syncope, tingling, trouble swallowing, visual change, weakness or weight loss. She has tried nothing for the symptoms.      Review of Systems  Constitutional: Negative for fever, chills and weight loss.  HENT: Negative for trouble swallowing.   Eyes: Negative for photophobia.  Respiratory: Negative for cough and shortness of breath.   Cardiovascular: Negative for chest pain and syncope.  Gastrointestinal: Positive for diarrhea (4 bowel movements per day for the last 2 days). Negative for nausea and vomiting.  Musculoskeletal: Positive for neck pain.  Neurological: Positive for headaches. Negative for tingling, syncope, weakness and numbness.  All other systems reviewed and are negative.    Past Medical History  Diagnosis Date  . Atrial fibrillation     arrythmia  . HTN (hypertension)   . Familial tremor   . Allergic rhinitis, cause unspecified 01/25/2011  . Thyroid disease   . Chronic diastolic CHF (congestive heart failure)     History   Social History  . Marital Status: Widowed    Spouse Name: N/A    Number of Children: N/A  . Years of Education: N/A   Occupational History  . Not on file.   Social History Main Topics  . Smoking status: Never Smoker   . Smokeless tobacco: Never Used  . Alcohol Use: No  . Drug Use: No  . Sexual  Activity: Not on file   Other Topics Concern  . Not on file   Social History Narrative   [employer]. 1 year business school. Married 1948-widowed 1981. 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 Just [place] [facility] [place] August '08-living with daughter. Terminal care-does Not want cardiac resuscitation or mechanical ventilation, artifical nutrition or hydration, renal dialysis or major heroic intervention. Laymen's Guide with forms provided.    Past Surgical History  Procedure Laterality Date  . Tonsillectomy    . Dilation and curettage of uterus    . Bunionectomy    . Laparoscopic ovarian cystectomy  04/2009    Family History  Problem Relation Age of Onset  . Pancreatic cancer Mother   . Coronary artery disease Father   . Diabetes Sister   . Diabetes Brother     No Known Allergies  Current Outpatient Prescriptions on File Prior to Visit  Medication Sig Dispense Refill  . aspirin 325 MG tablet Take 325 mg by mouth daily.       . Calcium Carbonate-Vitamin D (CALTRATE 600+D) 600-400 MG-UNIT per tablet Take 1 tablet by mouth 2 (two) times daily.       Marland Kitchen diltiazem (CARDIZEM CD) 120 MG 24 hr capsule Take 1 capsule (120 mg total) by mouth daily.  90 capsule  0  . furosemide (LASIX) 40 MG tablet Take 40 mg alternating with 80 mg by mouth daily  45 tablet  6  . hydrocortisone (ANUSOL-HC) 2.5 %  rectal cream Place 1 application rectally 2 (two) times daily.  30 g  2  . labetalol (NORMODYNE) 200 MG tablet Take 1 tablet (200 mg total) by mouth 2 (two) times daily.  120 tablet  3  . methimazole (TAPAZOLE) 5 MG tablet Take 5 mg by mouth daily.      . potassium chloride SA (K-DUR,KLOR-CON) 20 MEQ tablet Take 1 tablet (20 mEq total) by mouth 2 (two) times daily. Two 20mg  tabs in the a.m and two 20mg  tabs in the p.m  120 tablet  6  . warfarin (COUMADIN) 1 MG tablet Take as directed by anticoagulation clinic  150 tablet  1  . [DISCONTINUED] diltiazem (TIAZAC)  180 MG 24 hr capsule Take 1 capsule (180 mg total) by mouth daily.  30 capsule  11   No current facility-administered medications on file prior to visit.    EXAM: BP 128/70  Pulse 66  Temp(Src) 97.8 F (36.6 C) (Oral)  Resp 18  Wt 140 lb (63.504 kg)     Objective:   Physical Exam  Nursing note and vitals reviewed. Constitutional: She is oriented to person, place, and time. She appears well-developed and well-nourished. No distress.  HENT:  Head: Normocephalic and atraumatic.  Eyes: Conjunctivae and EOM are normal. Pupils are equal, round, and reactive to light.  Neck: Normal range of motion. Neck supple.  Cardiovascular: Normal rate, regular rhythm and intact distal pulses.   Pulmonary/Chest: Effort normal and breath sounds normal. No stridor. No respiratory distress. She exhibits no tenderness.  Musculoskeletal: She exhibits tenderness (mild ttp of the right cervical musculature.).  Lymphadenopathy:    She has no cervical adenopathy.  Neurological: She is alert and oriented to person, place, and time.  Skin: Skin is warm and dry. No rash noted. She is not diaphoretic. No erythema. No pallor.  Small scratch mark on posterior right neck, no erythema, warmth, ttp, fluctuance, swelling.  Psychiatric: She has a normal mood and affect. Her behavior is normal. Judgment and thought content normal.     Lab Results  Component Value Date   WBC 16.3* 03/05/2013   HGB 11.3* 05/01/2013   HCT 34.5* 05/01/2013   PLT 344 03/05/2013   GLUCOSE 109* 05/01/2013   CHOL 119 03/04/2013   ALT 20 02/26/2013   AST 16 02/26/2013   NA 140 05/01/2013   K 5.3* 05/01/2013   CL 107 05/01/2013   CREATININE 1.2 05/01/2013   BUN 19 05/01/2013   CO2 27 05/01/2013   TSH 0.950 02/26/2013   INR 2.0 08/20/2013   HGBA1C 6.7* 02/26/2013        Assessment & Plan:  Lenor was seen today for neck pain.  Diagnoses and associated orders for this visit:  Neck pain Comments: Pt feels this is improving some. Will try  warm compress and ice alternation to control symptoms. Watchful waiting.     Pt declined anything for pain, her spot on her neck looks like a scratch mark, nothing to suggest infection. Pt will try alternating ice and heat to help pain. Watchful waiting.  Pt is not concerned with her diarrhea symptoms, states that she occasionally gets diarrhea from certain foods in her diet, and knows how to deal with it. Encouraged pt to try bland diet, and fiber has calming effect on bowels.watchful waiting.  Return precautions provided, and patient handout on neck pain.  Plan to follow up as needed, or for worsening or persistent symptoms despite treatment.  Patient Instructions  Try alternating between  heat and ice to help neck pain symptoms.  If emergency symptoms discussed during visit developed, seek medical attention immediately.  Followup as needed, or for worsening or persistent symptoms despite treatment.    Patient information: Neck pain (The Basics) Written by the doctors and editors at UpToDate What can cause neck pain? - Neck pain happens when there is a problem with or injury to any of the parts ("structures") in the neck (figure 1). The structures in the neck include:  ?Bones - The neck has 7 bones that are stacked on top of each other. These bones make up the top part of the spine and are called the "cervical vertebrae." Neck pain can happen when the bones get worn down or develop abnormal growths (called "bone spurs"). ?Ligaments - Ligaments are strong tissues that connect bones to other bones. Ligament damage can happen when the neck moves back and forth suddenly (called "whiplash"), such as in a car accident. ?Discs - Discs are cushions that sit between the bones. When the discs change shape or move out of position, people can have symptoms. ?Muscles - Muscles hold the head up and make the neck move. Neck pain can be caused by muscle strain or tension, such as from poor posture or  stress. ?Nerves - A bundle of nerves (called the spinal cord) travels down the middle of the spine. Nerves branch off from the spinal cord to all parts of the body. People can have symptoms if their nerves are irritated or pushed on by nearby bones or discs. What symptoms can people with neck pain have? - People can have different symptoms that include:  ?Pain, stiffness, or tightness in the neck, shoulders, upper back, or arms ?Headaches ?Neck weakness ?Being unable to move or turn the neck ?Pain when turning or tilting the head ?Numbness or strange feelings (such as pins and needles) in the shoulders or arms ?Trouble walking or moving the legs ?Having no control over the bladder or bowels Should I see a doctor or nurse? - You should see a doctor or nurse if you have:  ?A severe injury to your head or neck ?Severe pain ?No control over your bladder or bowels ?Numbness or weakness in your arms or legs ?Pain that doesn't get better after you treat it at home for 1 week Do I need to have tests? - Most people do not need any tests. Your doctor or nurse will do an exam. He or she will feel your muscles and check how your head and neck move.  But some people might need tests. Tests can include:  ?X-ray, CT scan, MRI scan, or other imaging tests - Imaging tests create pictures of the inside of the body. ?Muscle or nerve tests to see if the muscles and nerves work normally Is there anything I can do on my own to feel better? - Yes. To reduce your symptoms, you can:  ?Take a pain-relieving medicine ?Massage the muscles that are tight or tense ?Put ice on the area to reduce pain - You can rub ice on the area for 5 to 7 minutes. Or you can put a frozen bag of peas or a cold gel pack on the area for 20 minutes at a time, a few times a day. ?Put heat on the area to reduce pain and stiffness - Take a hot shower or hot bath, or put a hot towel on the area. Don't use heat for more than 20 minutes at a  time. Don't  use anything too hot that could burn your skin. ?Do neck exercises - Different exercises can stretch the neck, shoulder, and back muscles and help make them stronger. Ask your doctor or nurse if you should do exercises, and which ones can help your symptoms. ?Reduce stress - Stress can make pain worse and prevent symptoms from getting better. Try to reduce your stress. You can ask your doctor or nurse about exercises that can help you relax. ?Watch your posture - Try to keep your neck straight in line with your body and avoid activities that involve a lot of neck movement. When you sleep, keep your head and neck in line with your body. Try to avoid sleeping on your stomach with your head turned to one side. What other treatments might I have? - Your doctor or nurse can use other treatments if your neck pain doesn't improve after you treat it at home. For example, he or she might suggest that you see an exercise expert, called a physical therapist. Or your doctor can inject a numbing medicine into your neck.  Can neck pain be prevented? - To help prevent neck pain, you can:  ?Use good posture - Hold your head up and keep your shoulders down. ?Avoid sitting in the same position for too long ?Avoid doing work above your head for too long ?Avoid putting weight or pressure on your upper back ?Keep your neck in line with the rest of your body when you sleep More on this topic  Patient information: Headache (The Basics) Patient information: Low back pain in adults (The Basics) Patient information: Whiplash (The Basics) Patient information: Neck pain (Beyond the Basics) Patient information: Neck fracture (The Basics)  All topics are updated as new evidence becomes available and our peer review process is complete. This topic retrieved from UpToDate on: Aug 29, 2013. The content on the UpToDate website is not intended nor recommended as a substitute for medical advice, diagnosis, or  treatment. Always seek the advice of your own physician or other qualified health care professional regarding any medical questions or conditions. The use of UpToDate content is governed by the UpToDate Terms of Use. 2015 UpToDate, Inc. All rights reserved. Topic 15793 Version 7.0 GRAPHICS  Anatomy of the neck Image Graphic [id number] Version 2.0

## 2013-08-29 NOTE — Progress Notes (Signed)
Pre visit review using our clinic review tool, if applicable. No additional management support is needed unless otherwise documented below in the visit note. 

## 2013-09-01 ENCOUNTER — Other Ambulatory Visit: Payer: Self-pay

## 2013-09-01 MED ORDER — DILTIAZEM HCL ER COATED BEADS 120 MG PO CP24: 120.0000 mg | ORAL_CAPSULE | Freq: Every day | ORAL | Status: DC

## 2013-09-17 ENCOUNTER — Ambulatory Visit (INDEPENDENT_AMBULATORY_CARE_PROVIDER_SITE_OTHER): Payer: Medicare Other | Admitting: Family Medicine

## 2013-09-17 DIAGNOSIS — I82409 Acute embolism and thrombosis of unspecified deep veins of unspecified lower extremity: Secondary | ICD-10-CM | POA: Diagnosis not present

## 2013-09-17 DIAGNOSIS — Z5181 Encounter for therapeutic drug level monitoring: Secondary | ICD-10-CM | POA: Diagnosis not present

## 2013-09-17 DIAGNOSIS — J90 Pleural effusion, not elsewhere classified: Secondary | ICD-10-CM

## 2013-09-17 LAB — POCT INR: INR: 1.4

## 2013-10-01 ENCOUNTER — Ambulatory Visit (INDEPENDENT_AMBULATORY_CARE_PROVIDER_SITE_OTHER): Payer: Medicare Other | Admitting: Internal Medicine

## 2013-10-01 ENCOUNTER — Encounter: Payer: Self-pay | Admitting: Internal Medicine

## 2013-10-01 VITALS — BP 120/58 | HR 52 | Temp 97.8°F | Ht 67.5 in | Wt 139.2 lb

## 2013-10-01 DIAGNOSIS — I10 Essential (primary) hypertension: Secondary | ICD-10-CM

## 2013-10-01 DIAGNOSIS — I4891 Unspecified atrial fibrillation: Secondary | ICD-10-CM

## 2013-10-01 DIAGNOSIS — I509 Heart failure, unspecified: Secondary | ICD-10-CM | POA: Diagnosis not present

## 2013-10-01 DIAGNOSIS — I482 Chronic atrial fibrillation, unspecified: Secondary | ICD-10-CM

## 2013-10-01 DIAGNOSIS — Z23 Encounter for immunization: Secondary | ICD-10-CM | POA: Diagnosis not present

## 2013-10-01 DIAGNOSIS — I5032 Chronic diastolic (congestive) heart failure: Secondary | ICD-10-CM

## 2013-10-01 NOTE — Patient Instructions (Signed)
It was good to see you today.  We have reviewed your prior records including labs and tests today  Your annual flu shot was given and/or updated today.  Medications reviewed and updated, no changes recommended at this time.  Continue working with your other specialists as ongoing  Please schedule followup in 6 months, call sooner if problems.

## 2013-10-01 NOTE — Assessment & Plan Note (Signed)
Acute exacerbation January 2015 in the setting of pneumonia Medication changes reviewed, compensated and euvolemic present No peripheral edema, no shortness of breath or cough Rate controlled atrial fibrillation Followup with cardiology as planned Continue medications as prescribed, no changes

## 2013-10-01 NOTE — Progress Notes (Signed)
Subjective:    Patient ID: Danielle Adams, female    DOB: 04-12-1916, 80 y.o.   MRN: [id number]  HPI Patient here to establish care, previously followed with Dr. Deeann Dowse, last office visit March 2015 reviewed Patient is here for follow up  Reviewed chronic medical issues and interval medical events  Past Medical History  Diagnosis Date  . Atrial fibrillation     arrythmia  . HTN (hypertension)   . Familial tremor   . Allergic rhinitis, cause unspecified 01/25/2011  . Thyroid disease   . Chronic diastolic CHF (congestive heart failure)     Review of Systems  Constitutional: Negative for fever, fatigue and unexpected weight change.  Respiratory: Negative for cough and shortness of breath.   Cardiovascular: Negative for chest pain and leg swelling.       Objective:   Physical Exam  BP 120/58  Pulse 52  Temp(Src) 97.8 F (36.6 C) (Oral)  Ht 5' 7.5" (1.715 m)  Wt 139 lb 4 oz (63.163 kg)  BMI 21.48 kg/m2  SpO2 97% Wt Readings from Last 3 Encounters:  10/01/13 139 lb 4 oz (63.163 kg)  08/29/13 140 lb (63.504 kg)  05/19/13 138 lb (62.596 kg)   Constitutional: She appears well-developed and well-nourished. No distress. daughter Claris Che at side Neck: Normal range of motion. Neck supple. No JVD present. No thyromegaly present.  Cardiovascular: Normal rate, regular rhythm and normal heart sounds.  No murmur heard. No BLE edema. Pulmonary/Chest: Effort normal and breath sounds normal. No respiratory distress. She has no wheezes.  Neuro: essential tremor of head and neck, chronic Psychiatric: She has a normal mood and affect. Her behavior is normal. Judgment and thought content normal.   Lab Results  Component Value Date   WBC 16.3* 03/05/2013   HGB 11.3* 05/01/2013   HCT 34.5* 05/01/2013   PLT 344 03/05/2013   GLUCOSE 109* 05/01/2013   CHOL 119 03/04/2013   ALT 20 02/26/2013   AST 16 02/26/2013   NA 140 05/01/2013   K 5.3* 05/01/2013   CL 107 05/01/2013   CREATININE 1.2  05/01/2013   BUN 19 05/01/2013   CO2 27 05/01/2013   TSH 0.950 02/26/2013   INR 1.4 09/17/2013   HGBA1C 6.7* 02/26/2013    Dg Chest 2 View  05/01/2013   CLINICAL DATA:  Monitor pleural effusion.  EXAM: CHEST  2 VIEW  COMPARISON:  April 02, 2013  FINDINGS: The heart size and mediastinal contours are stable. The heart size is enlarged. The aorta is tortuous. There is moderate right pleural effusion with consolidation of right mid and lung base unchanged. The previously noted nodule in the left mid lung is not as well seen as air is over penetration through the left lung on this film. Evaluation of the left lung is very limited due to over penetrated technique. The visualized skeletal structures are stable  IMPRESSION: Moderate right pleural effusion with consolidation of the right mid and lung base unchanged.   Electronically Signed   By: Sherian Rein M.D.   On: 05/01/2013 15:30       Assessment & Plan:   Problem List Items Addressed This Visit   ATRIAL FIBRILLATION, PAROXYSMAL     Rate controlled on current medication, note decrease in diltiazem dose March 2015, presumably due to asymptomatic bradycardia Blood pressure control  As well Anticoagulation ongoing, followup in the anticoagulation clinic as planned    Chronic diastolic congestive heart failure - Primary     Acute exacerbation January  2015 in the setting of pneumonia Medication changes reviewed, compensated and euvolemic present No peripheral edema, no shortness of breath or cough Rate controlled atrial fibrillation Followup with cardiology as planned Continue medications as prescribed, no changes    HYPERTENSION      BP Readings from Last 3 Encounters:  10/01/13 120/58  08/29/13 128/70  05/19/13 118/78   The current medical regimen is effective;  continue present plan and medications.        Time spent with pt/family today 25 minutes, greater than 50% time spent counseling patient on AF, dHF, fall prevention and  medication review. Also review of prior records

## 2013-10-01 NOTE — Progress Notes (Signed)
Pre visit review using our clinic review tool, if applicable. No additional management support is needed unless otherwise documented below in the visit note. 

## 2013-10-01 NOTE — Assessment & Plan Note (Signed)
BP Readings from Last 3 Encounters:  10/01/13 120/58  08/29/13 128/70  05/19/13 118/78   The current medical regimen is effective;  continue present plan and medications.

## 2013-10-01 NOTE — Assessment & Plan Note (Signed)
Rate controlled on current medication, note decrease in diltiazem dose March 2015, presumably due to asymptomatic bradycardia Blood pressure control  As well Anticoagulation ongoing, followup in the anticoagulation clinic as planned

## 2013-10-15 ENCOUNTER — Ambulatory Visit (INDEPENDENT_AMBULATORY_CARE_PROVIDER_SITE_OTHER): Payer: Medicare Other | Admitting: *Deleted

## 2013-10-15 DIAGNOSIS — Z5181 Encounter for therapeutic drug level monitoring: Secondary | ICD-10-CM

## 2013-10-15 DIAGNOSIS — J90 Pleural effusion, not elsewhere classified: Secondary | ICD-10-CM | POA: Diagnosis not present

## 2013-10-15 DIAGNOSIS — I82409 Acute embolism and thrombosis of unspecified deep veins of unspecified lower extremity: Secondary | ICD-10-CM | POA: Diagnosis not present

## 2013-10-15 LAB — POCT INR: INR: 1.5

## 2013-10-20 ENCOUNTER — Other Ambulatory Visit: Payer: Self-pay | Admitting: *Deleted

## 2013-10-20 ENCOUNTER — Other Ambulatory Visit: Payer: Self-pay | Admitting: Internal Medicine

## 2013-10-20 MED ORDER — WARFARIN SODIUM 1 MG PO TABS: ORAL_TABLET | ORAL | Status: DC

## 2013-10-27 ENCOUNTER — Encounter: Payer: Self-pay | Admitting: Cardiovascular Disease

## 2013-10-27 ENCOUNTER — Ambulatory Visit (INDEPENDENT_AMBULATORY_CARE_PROVIDER_SITE_OTHER): Payer: Medicare Other | Admitting: Cardiovascular Disease

## 2013-10-27 VITALS — BP 120/50 | HR 44 | Ht 67.5 in | Wt 140.0 lb

## 2013-10-27 DIAGNOSIS — I5032 Chronic diastolic (congestive) heart failure: Secondary | ICD-10-CM

## 2013-10-27 DIAGNOSIS — I509 Heart failure, unspecified: Secondary | ICD-10-CM | POA: Diagnosis not present

## 2013-10-27 DIAGNOSIS — I4819 Other persistent atrial fibrillation: Secondary | ICD-10-CM

## 2013-10-27 DIAGNOSIS — I10 Essential (primary) hypertension: Secondary | ICD-10-CM | POA: Diagnosis not present

## 2013-10-27 DIAGNOSIS — I4891 Unspecified atrial fibrillation: Secondary | ICD-10-CM

## 2013-10-27 NOTE — Progress Notes (Signed)
History of Present Illness: 80 yo female with history of HTN, Hypothyroidism, paroxysmal atrial fibrillation, chronic diastolic CHF, pleural effusion here today for cardiac follow up.  She was admitted to [facility] 02/25/13 with c/o dyspnea, LE edema, cough. She was found to be volume overloaded and felt to have a pneumonia, found to have acute LE DVT, pleural effusion. She was diuresed with IV Lasix and treated for pneumonia. Echo with normal LV size and function, mild AS, mild AI, mild MR. She had rate controlled atrial fibrillation during the hospitalization. She was also found to have a DVT. She was started on Cardizem for rate control of atrial fib and coumadin for anti-coagulation given atrial fib and DVT. Pulmonary followed her in the hospital for a pleural effusion and performed diagnostic thoracentesis. No chest tube was placed. She was discharged to [facility] nursing facility. I saw her February 2015 and she had recurrence of lower ext edema. Her Lasix was increased. INR has been followed in [place] primary care office.   She is here today for follow up. She is doing well overall. No chest pain.  Primary Care Physician: Felicity Coyer  Past Medical History  Diagnosis Date  . Atrial fibrillation     arrythmia  . HTN (hypertension)   . Familial tremor   . Allergic rhinitis, cause unspecified 01/25/2011  . Thyroid disease   . Chronic diastolic CHF (congestive heart failure)     Past Surgical History  Procedure Laterality Date  . Tonsillectomy    . Dilation and curettage of uterus    . Bunionectomy    . Laparoscopic ovarian cystectomy  04/2009    Current Outpatient Prescriptions  Medication Sig Dispense Refill  . aspirin 325 MG tablet Take 325 mg by mouth daily.       . Calcium Carbonate-Vitamin D (CALTRATE 600+D) 600-400 MG-UNIT per tablet Take 1 tablet by mouth 2 (two) times daily.       Marland Kitchen diltiazem (CARDIZEM CD) 120 MG 24 hr capsule Take 1 capsule (120 mg total) by mouth daily.  90  capsule  0  . furosemide (LASIX) 40 MG tablet Take 40 mg alternating with 80 mg by mouth daily  45 tablet  6  . hydrocortisone (ANUSOL-HC) 2.5 % rectal cream Place 1 application rectally 2 (two) times daily.  30 g  2  . labetalol (NORMODYNE) 200 MG tablet Take 1 tablet (200 mg total) by mouth 2 (two) times daily.  120 tablet  3  . methimazole (TAPAZOLE) 5 MG tablet Take 5 mg by mouth daily.      . potassium chloride SA (K-DUR,KLOR-CON) 20 MEQ tablet Take 1 tablet (20 mEq total) by mouth 2 (two) times daily. Two  tabs in the a.m and two  tabs in the p.m  120 tablet  6  . warfarin (COUMADIN) 1 MG tablet TAKE AS DIRECTED BY ANTICOAGULATION CLINIC  150 tablet  0  . warfarin (COUMADIN) 1 MG tablet Take as directed by anticoagulation clinic  150 tablet  1  . [DISCONTINUED] diltiazem (TIAZAC) 180 MG 24 hr capsule Take 1 capsule (180 mg total) by mouth daily.  30 capsule  11   No current facility-administered medications for this visit.    No Known Allergies  History   Social History  . Marital Status: Widowed    Spouse Name: N/A    Number of Children: N/A  . Years of Education: N/A   Occupational History  . Not on file.   Social History Main  Topics  . Smoking status: Never Smoker   . Smokeless tobacco: Never Used  . Alcohol Use: No  . Drug Use: No  . Sexual Activity: Not on file   Other Topics Concern  . Not on file   Social History Narrative   [employer]. 1 year business school. Married 1948-widowed 1981. 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 Just moved to [facility] August '08-living with daughter. Terminal care-does Not want cardiac resuscitation or mechanical ventilation, artifical nutrition or hydration, renal dialysis or major heroic intervention. Laymen's Guide with forms provided.    Family History  Problem Relation Age of Onset  . Pancreatic cancer Mother   . Coronary artery disease Father   . Diabetes Sister   .  Diabetes Brother     Review of Systems:  As stated in the HPI and otherwise negative.   BP 120/50  Pulse 44  Ht 5' 7.5" (1.715 m)  Wt 140 lb (63.504 kg)  BMI 21.59 kg/m2  SpO2 96%  Physical Examination: General: Well developed, well nourished, NAD HEENT: OP clear, mucus membranes moist SKIN: warm, dry. No rashes. Neuro: No focal deficits Musculoskeletal: Muscle strength 5/5 all ext Psychiatric: Mood and affect normal Neck: No JVD, no carotid bruits, no thyromegaly, no lymphadenopathy. Lungs:Clear bilaterally, no wheezes, rhonci, crackles Cardiovascular: Irregular irregular No murmurs, gallops or rubs. Abdomen:Soft. Bowel sounds present. Non-tender.  Extremities: 2+ bilateral lower extremity edema.   Echo 02/26/13: Left ventricle: The cavity size was normal. Wall thickness was increased in a pattern of mild LVH. Systolic function was normal. The estimated ejection fraction was in the range of 60% to 65%. Wall motion was normal; there were no regional wall motion abnormalities. - Aortic valve: There was mild stenosis. Mild regurgitation. Valve area: 1.06cm^2(VTI). Valve area: 0.98cm^2 (Vmax). - Mitral valve: Mild regurgitation. - Left atrium: The atrium was moderately dilated. - Right atrium: The atrium was moderately dilated. - Tricuspid valve: Moderate regurgitation. - Pulmonary arteries: PA peak pressure: 42mm Hg (S). - Pericardium, extracardiac: A trivial pericardial effusion was identified posterior to the heart.  Assessment and Plan:   1. Atrial fibrillation: Rate is controlled. Will stop Cardizem and will continue beta blocker with bradycardia. She has been anti-coagulated with coumadin. Her INR is being followed in primary care office. No plans for cardioversion.   2. Chronic diastolic CHF: Will continue Lasix 40 mg daily alternating with 80 mg on every other day  3. HTN: BP controlled. No changes.   4. H/o DVT: Continue coumadin.

## 2013-10-27 NOTE — Patient Instructions (Signed)
Your physician wants you to follow-up in:  6 months. You will receive a reminder letter in the mail two months in advance. If you don't receive a letter, please call our office to schedule the follow-up appointment.  Your physician has recommended you make the following change in your medication: Stop Aspirin. Stop Cardizem

## 2013-10-31 ENCOUNTER — Telehealth: Payer: Self-pay | Admitting: Cardiovascular Disease

## 2013-10-31 NOTE — Telephone Encounter (Signed)
Spoke with pt dtr, aware she was taken off the aspirin because she is on coumadin. She has no other symptoms. They do report her bp is 170/80 but her bp cuff reads abnormally high. She has taken her medicine this morning. She will take tylenol for the headache and call back later today if symptoms change or worsen. Pt agreed with this plan.

## 2013-10-31 NOTE — Telephone Encounter (Signed)
New message     Pt is having headaches and dizziness.  Dr Clifton James took pt off her aspirin last Monday.  Worried about possibility of a stroke.  Please advise

## 2013-11-12 ENCOUNTER — Ambulatory Visit (INDEPENDENT_AMBULATORY_CARE_PROVIDER_SITE_OTHER): Payer: Medicare Other | Admitting: *Deleted

## 2013-11-12 DIAGNOSIS — Z5181 Encounter for therapeutic drug level monitoring: Secondary | ICD-10-CM

## 2013-11-12 DIAGNOSIS — I82409 Acute embolism and thrombosis of unspecified deep veins of unspecified lower extremity: Secondary | ICD-10-CM | POA: Diagnosis not present

## 2013-11-12 DIAGNOSIS — J90 Pleural effusion, not elsewhere classified: Secondary | ICD-10-CM | POA: Diagnosis not present

## 2013-11-12 LAB — POCT INR: INR: 2.5

## 2013-11-26 ENCOUNTER — Ambulatory Visit (INDEPENDENT_AMBULATORY_CARE_PROVIDER_SITE_OTHER): Payer: Medicare Other | Admitting: Family

## 2013-11-26 ENCOUNTER — Encounter: Payer: Self-pay | Admitting: Family

## 2013-11-26 VITALS — BP 122/62 | HR 56 | Temp 97.4°F | Resp 16

## 2013-11-26 DIAGNOSIS — M5432 Sciatica, left side: Secondary | ICD-10-CM

## 2013-11-26 DIAGNOSIS — M5430 Sciatica, unspecified side: Secondary | ICD-10-CM | POA: Insufficient documentation

## 2013-11-26 MED ORDER — METHYLPREDNISOLONE (PAK) 4 MG PO TABS: ORAL_TABLET | ORAL | Status: DC

## 2013-11-26 NOTE — Progress Notes (Signed)
Pre visit review using our clinic review tool, if applicable. No additional management support is needed unless otherwise documented below in the visit note. 

## 2013-11-26 NOTE — Progress Notes (Signed)
   Subjective:    Patient ID: Danielle Adams Boddy, female    DOB: May 04, 1916, 80 y.o.   MRN: [id number]  Chief Complaint  Patient presents with  . Hip Pain    Left hip down to leg has been hurting when trying to walk, she has been constipated for several days and thinks that has something to do with it, she did have 2 good bms this morning and felt better afterwards but still feels shakey and weak.     HPI:  Danielle Adams Schmader is a 80 y.o. female who presents today for hip/back pain. Her daughter is present for today's visit with her permission.   Increased in the last 10 days. Located below the waist and goes down the left side. Denies any trauma. Pain is located on the back of the knee. Normally able to function in the house and perform her activities of daily living. Has been living with her daughter and has help if needed. Has taken Tylenol on occasion which helped last night. Pain was worst and real intense last night.   No Known Allergies  Current Outpatient Prescriptions on File Prior to Visit  Medication Sig Dispense Refill  . Calcium Carbonate-Vitamin D (CALTRATE 600+D) 600-400 MG-UNIT per tablet Take 1 tablet by mouth 2 (two) times daily.       . furosemide (LASIX) 40 MG tablet Take 40 mg alternating with 80 mg by mouth daily  45 tablet  6  . hydrocortisone (ANUSOL-HC) 2.5 % rectal cream Place 1 application rectally 2 (two) times daily.  30 g  2  . labetalol (NORMODYNE) 200 MG tablet Take 1 tablet (200 mg total) by mouth 2 (two) times daily.  120 tablet  3  . methimazole (TAPAZOLE) 5 MG tablet Take 5 mg by mouth daily.      . potassium chloride SA (K-DUR,KLOR-CON) 20 MEQ tablet Take 1 tablet (20 mEq total) by mouth 2 (two) times daily. Two 20mg  tabs in the a.m and two 20mg  tabs in the p.m  120 tablet  6  . warfarin (COUMADIN) 1 MG tablet TAKE AS DIRECTED BY ANTICOAGULATION CLINIC  150 tablet  0  . warfarin (COUMADIN) 1 MG tablet Take as directed by anticoagulation clinic  150 tablet  1  .  [DISCONTINUED] diltiazem (TIAZAC) 180 MG 24 hr capsule Take 1 capsule (180 mg total) by mouth daily.  30 capsule  11   No current facility-administered medications on file prior to visit.   Review of Systems    See HPI Objective:    BP 122/62  Pulse 56  Temp(Src) 97.4 F (36.3 C) (Oral)  Resp 16  SpO2 97% Nursing note and vital signs reviewed.  Physical Exam  Constitutional: She is oriented to person, place, and time.  Elderly, thin female seated in the wheelchair who appears her stated age and in NAD.   Cardiovascular: Normal rate and regular rhythm.   Pulmonary/Chest: Effort normal and breath sounds normal.  Musculoskeletal:  No obvious deformity, discoloration, or edema of low back noted. Palpable tenderness noted lumbar musculature and around piriformis. Palpation increases the radiating pain down the back of her leg. Denies hip pain. Sensation intact bilaterally.   Neurological: She is alert and oriented to person, place, and time.  Skin: Skin is warm and dry.  Psychiatric: She has a normal mood and affect. Her behavior is normal. Judgment and thought content normal.       Assessment & Plan:

## 2013-11-26 NOTE — Assessment & Plan Note (Signed)
No obvious etiology. Tenderness of sciatic nerve present with radiation. Start medrol dose pack. Instructed to use heat/ice as needed for pain relief. Follow up if symptoms worsen or fail to improve.

## 2013-11-26 NOTE — Patient Instructions (Signed)
Thank you for choosing [facility].  Summary/Instructions:   Start the Medrol dose pack per instructions   May use heat or ice 2-3 times per day as needed.   Use therma-care as needed for symptom relief  If symptoms worsen or fail to improve please let us know.

## 2013-11-29 ENCOUNTER — Other Ambulatory Visit: Payer: Self-pay | Admitting: Emergency Medicine

## 2013-12-04 ENCOUNTER — Other Ambulatory Visit: Payer: Self-pay | Admitting: Internal Medicine

## 2013-12-08 ENCOUNTER — Encounter: Payer: Self-pay | Admitting: Family

## 2013-12-15 DIAGNOSIS — Z961 Presence of intraocular lens: Secondary | ICD-10-CM | POA: Diagnosis not present

## 2013-12-15 DIAGNOSIS — H04123 Dry eye syndrome of bilateral lacrimal glands: Secondary | ICD-10-CM | POA: Diagnosis not present

## 2013-12-15 DIAGNOSIS — H01001 Unspecified blepharitis right upper eyelid: Secondary | ICD-10-CM | POA: Diagnosis not present

## 2013-12-15 DIAGNOSIS — H01004 Unspecified blepharitis left upper eyelid: Secondary | ICD-10-CM | POA: Diagnosis not present

## 2013-12-19 ENCOUNTER — Other Ambulatory Visit: Payer: Self-pay

## 2013-12-20 NOTE — Telephone Encounter (Signed)
Rx requested has been pended.

## 2013-12-24 ENCOUNTER — Telehealth: Payer: Self-pay | Admitting: Family

## 2013-12-24 ENCOUNTER — Other Ambulatory Visit: Payer: Self-pay

## 2013-12-24 ENCOUNTER — Ambulatory Visit (INDEPENDENT_AMBULATORY_CARE_PROVIDER_SITE_OTHER): Payer: Medicare Other

## 2013-12-24 DIAGNOSIS — I82409 Acute embolism and thrombosis of unspecified deep veins of unspecified lower extremity: Secondary | ICD-10-CM | POA: Diagnosis not present

## 2013-12-24 DIAGNOSIS — J90 Pleural effusion, not elsewhere classified: Secondary | ICD-10-CM | POA: Diagnosis not present

## 2013-12-24 DIAGNOSIS — Z5181 Encounter for therapeutic drug level monitoring: Secondary | ICD-10-CM

## 2013-12-24 LAB — POCT INR: INR: 3.2

## 2013-12-24 MED ORDER — METHIMAZOLE 5 MG PO TABS: 5.0000 mg | ORAL_TABLET | Freq: Every day | ORAL | Status: DC

## 2013-12-24 NOTE — Telephone Encounter (Signed)
erx already done.

## 2013-12-24 NOTE — Telephone Encounter (Signed)
Agree with current plan

## 2013-12-24 NOTE — Telephone Encounter (Signed)
Yes ok to refill 

## 2014-01-14 ENCOUNTER — Ambulatory Visit (INDEPENDENT_AMBULATORY_CARE_PROVIDER_SITE_OTHER): Payer: Medicare Other

## 2014-01-14 DIAGNOSIS — I82409 Acute embolism and thrombosis of unspecified deep veins of unspecified lower extremity: Secondary | ICD-10-CM | POA: Diagnosis not present

## 2014-01-14 DIAGNOSIS — J90 Pleural effusion, not elsewhere classified: Secondary | ICD-10-CM

## 2014-01-14 DIAGNOSIS — Z5181 Encounter for therapeutic drug level monitoring: Secondary | ICD-10-CM | POA: Diagnosis not present

## 2014-01-14 LAB — POCT INR: INR: 2.7

## 2014-02-06 ENCOUNTER — Other Ambulatory Visit: Payer: Self-pay | Admitting: Cardiovascular Disease

## 2014-02-11 ENCOUNTER — Encounter: Payer: Self-pay | Admitting: Internal Medicine

## 2014-02-11 ENCOUNTER — Ambulatory Visit (INDEPENDENT_AMBULATORY_CARE_PROVIDER_SITE_OTHER): Payer: Medicare Other | Admitting: Family Medicine

## 2014-02-11 ENCOUNTER — Ambulatory Visit (INDEPENDENT_AMBULATORY_CARE_PROVIDER_SITE_OTHER): Payer: Medicare Other | Admitting: Internal Medicine

## 2014-02-11 VITALS — BP 120/72 | HR 55 | Temp 98.1°F | Wt 144.5 lb

## 2014-02-11 DIAGNOSIS — Z5181 Encounter for therapeutic drug level monitoring: Secondary | ICD-10-CM | POA: Diagnosis not present

## 2014-02-11 DIAGNOSIS — I82409 Acute embolism and thrombosis of unspecified deep veins of unspecified lower extremity: Secondary | ICD-10-CM | POA: Diagnosis not present

## 2014-02-11 DIAGNOSIS — J0110 Acute frontal sinusitis, unspecified: Secondary | ICD-10-CM | POA: Diagnosis not present

## 2014-02-11 DIAGNOSIS — J90 Pleural effusion, not elsewhere classified: Secondary | ICD-10-CM

## 2014-02-11 LAB — POCT INR: INR: 2.9

## 2014-02-11 MED ORDER — AMOXICILLIN 500 MG PO CAPS: 500.0000 mg | ORAL_CAPSULE | Freq: Three times a day (TID) | ORAL | Status: DC

## 2014-02-11 NOTE — Progress Notes (Signed)
   Subjective:    Patient ID: Danielle Adams, female    DOB: [birth year], 81 y.o.   MRN: [id number]  HPI  Her symptoms began before the Christmas holiday as head congestion. She's had some sneezing. Nasal secretions are now becoming somewhat yellow. Slight am sore thraot.  The family has noted some slight nocturnal cough. There is concern as she had pneumonia last year  & was in the hospital.  She has no other upper or lower respiratory tract infection symptoms.  Review of Systems Facial pain , nasal purulence, dental pain, sore throat , otic pain or otic discharge denied. No fever , chills or sweats.Extrinsic symptoms of itchy, watery eyes, sneezing, or angioedema are denied. There is no sputum production, wheezing,or  paroxysmal nocturnal dyspnea.     Objective:   Physical Exam  Positive or pertinent findings include: She is in a wheelchair. She appears her stated age She has bilateral hearing aids with wax buildup bilaterally. She has a intention tremor of her head and hands.  She has a grade 1. 5-2 systolic murmur loudest at the base Slight clubbing suggested. She also has some slight pedal edema.  General adequately nourished; no acute distress or increased work of breathing is present.  No  lymphadenopathy about the head, neck, or axilla noted.  Eyes: No conjunctival inflammation or lid edema is present. There is no scleral icterus. Ears:  External ear exam shows no significant lesions or deformities.   Nose:  External nasal examination shows no deformity or inflammation. Nasal mucosa are pink and moist without lesions or exudates. No septal dislocation or deviation.No obstruction to airflow.  Oral exam: Dental hygiene is good; lips and gums are healthy appearing.There is no oropharyngeal erythema or exudate noted.  Neck:  No deformities,  masses, or tenderness noted.    Heart:  Normal rate and regular rhythm. S1 and S2 normal without gallop,click, rub or other extra sounds.    Lungs:Chest clear to auscultation; no wheezes, rhonchi,rales ,or rubs present.No increased work of breathing.   Extremities:  No cyanosis noted  Skin: Warm & dry w/o jaundice or tenting.      Assessment & Plan:  #1 rhinosinusitis without significant bronchitis  Plan: Nasal hygiene interventions discussed. See prescription medications

## 2014-02-11 NOTE — Patient Instructions (Addendum)
Plain Mucinex (NOT D) for thick secretions ;force NON dairy fluids .   Nasal cleansing in the shower as discussed with lather of mild shampoo.After 10 seconds wash off lather while  exhaling through nostrils. Make sure that all residual soap is removed to prevent irritation.  Flonase OR Nasacort AQ 1 spray in each nostril twice a day as needed. Use the "crossover" technique into opposite nostril spraying toward opposite ear @ 45 degree angle, not straight up into nostril.  Plain Allegra (NOT D )  160 daily , Loratidine 10 mg , OR Zyrtec 10 mg @ bedtime  as needed for itchy eyes & sneezing.   Please do not use Q-tips as we discussed. Should wax build up occur, please put 2-3 drops of mineral oil in the affected  ear at night to soften the wax .Cover the canal with a  cotton ball to prevent the oil from staining bed linens. In the morning fill the ear canal with hydrogen peroxide & lie in the opposite lateral decubitus position(on the side opposite the affected ear)  for 10-15 minutes. After allowing this period of time for the peroxide to dissolve the wax ;shower and use the thinnest washrag available to wick out the wax. If both ears are involved ; alternate this treatment from ear to ear each night until no wax is found on the washrag. 

## 2014-02-11 NOTE — Progress Notes (Signed)
Pre visit review using our clinic review tool, if applicable. No additional management support is needed unless otherwise documented below in the visit note. 

## 2014-03-02 ENCOUNTER — Other Ambulatory Visit: Payer: Self-pay | Admitting: Internal Medicine

## 2014-03-11 ENCOUNTER — Ambulatory Visit (INDEPENDENT_AMBULATORY_CARE_PROVIDER_SITE_OTHER): Payer: Medicare Other | Admitting: General Practice

## 2014-03-11 DIAGNOSIS — I82409 Acute embolism and thrombosis of unspecified deep veins of unspecified lower extremity: Secondary | ICD-10-CM

## 2014-03-11 DIAGNOSIS — J90 Pleural effusion, not elsewhere classified: Secondary | ICD-10-CM

## 2014-03-11 DIAGNOSIS — Z5181 Encounter for therapeutic drug level monitoring: Secondary | ICD-10-CM | POA: Diagnosis not present

## 2014-03-11 LAB — POCT INR: INR: 3.1

## 2014-03-11 NOTE — Progress Notes (Signed)
Pre visit review using our clinic review tool, if applicable. No additional management support is needed unless otherwise documented below in the visit note. 

## 2014-03-11 NOTE — Progress Notes (Signed)
Agree with plan 

## 2014-04-03 ENCOUNTER — Telehealth: Payer: Self-pay | Admitting: Internal Medicine

## 2014-04-03 NOTE — Telephone Encounter (Signed)
Needs to make sure forms for [employer] got completed.  Changed appointment on Monday to Dr. Dorise HissKollar since WardLesschber is not in.

## 2014-04-06 ENCOUNTER — Other Ambulatory Visit (INDEPENDENT_AMBULATORY_CARE_PROVIDER_SITE_OTHER): Payer: Medicare Other

## 2014-04-06 ENCOUNTER — Ambulatory Visit: Payer: Medicare Other | Admitting: Internal Medicine

## 2014-04-06 ENCOUNTER — Ambulatory Visit (INDEPENDENT_AMBULATORY_CARE_PROVIDER_SITE_OTHER): Payer: Medicare Other | Admitting: Internal Medicine

## 2014-04-06 ENCOUNTER — Encounter: Payer: Self-pay | Admitting: Internal Medicine

## 2014-04-06 VITALS — BP 152/80 | HR 54 | Temp 97.8°F | Resp 16 | Wt 143.0 lb

## 2014-04-06 DIAGNOSIS — E0590 Thyrotoxicosis, unspecified without thyrotoxic crisis or storm: Secondary | ICD-10-CM | POA: Diagnosis not present

## 2014-04-06 DIAGNOSIS — I5032 Chronic diastolic (congestive) heart failure: Secondary | ICD-10-CM | POA: Diagnosis not present

## 2014-04-06 LAB — T4, FREE: Free T4: 0.95 ng/dL (ref 0.60–1.60)

## 2014-04-06 LAB — TSH: TSH: 0.25 u[IU]/mL — ABNORMAL LOW (ref 0.35–4.50)

## 2014-04-06 NOTE — Patient Instructions (Signed)
We will take care of the letter to the insurance company and will check the labs today for the thyroid.   We will call you back with the results.   If you have any problems or questions please feel free to call the office.

## 2014-04-06 NOTE — Telephone Encounter (Signed)
I have the letter from [employer].

## 2014-04-06 NOTE — Assessment & Plan Note (Signed)
Not taking methimazole for many months now. Will check TSH and T4 and adjust as needed. No palpitations or other symptoms to suggest thyrotoxicosis.

## 2014-04-06 NOTE — Assessment & Plan Note (Signed)
Currently using 2 L oxygen at night time for possible night time desaturation. Have verbal pulse ox from family who have checked it to support medical need and necessity for the oxygen. It is also improving her quality of life and making her more awake during the day and without headaches. Will write letter to [employer] to support night time oxygen only. No evidence of desaturating with walking and would avoid oxygen while walking to decrease risk of fall.

## 2014-04-06 NOTE — Progress Notes (Signed)
Pre visit review using our clinic review tool, if applicable. No additional management support is needed unless otherwise documented below in the visit note. 

## 2014-04-06 NOTE — Progress Notes (Signed)
   Subjective:    Patient ID: Danielle Adams, female    DOB: [birth year], 81 y.o.   MRN: [id number]  HPI The patient is a 81 YO female who is coming in today for re-evaluation of her oxygen need. She started on oxygen about 1 year ago when she had a pneumonia and congestive heart failure exacerbation. She does not have any known chronic lung disease. She is able to walk short distances at home without becoming winded or having drop in saturation (her family has pulse ox at home and check her levels). She does still use the oxygen at night time (2L) and family has found her saturations to be 87-89% if she does not use the oxygen at night time. She feels more rested and good when she uses it.   Review of Systems  Constitutional: Negative for fever, activity change, appetite change, fatigue and unexpected weight change.  HENT: Negative.   Eyes: Negative.   Respiratory: Negative for cough, chest tightness, shortness of breath and wheezing.   Cardiovascular: Negative for chest pain, palpitations and leg swelling.      Objective:   Physical Exam  Constitutional: She is oriented to person, place, and time. She appears well-developed.  Frail appearing  HENT:  Head: Normocephalic and atraumatic.  Eyes: EOM are normal.  Neck: Normal range of motion.  Cardiovascular: Normal rate.   Pulmonary/Chest: Effort normal and breath sounds normal. No respiratory distress. She has no wheezes. She has no rales.  Abdominal: Soft.  Neurological: She is alert and oriented to person, place, and time. Coordination abnormal.  Tremor at baseline which she is able to voluntarily suppress.   Skin: Skin is warm and dry.   Filed Vitals:   04/06/14 1055  BP: 152/80  Pulse: 54  Temp: 97.8 F (36.6 C)  TempSrc: Oral  Resp: 16  Weight: 143 lb (64.864 kg)  SpO2: 97%      Assessment & Plan:

## 2014-04-08 ENCOUNTER — Ambulatory Visit (INDEPENDENT_AMBULATORY_CARE_PROVIDER_SITE_OTHER): Payer: Medicare Other | Admitting: General Practice

## 2014-04-08 DIAGNOSIS — J90 Pleural effusion, not elsewhere classified: Secondary | ICD-10-CM | POA: Diagnosis not present

## 2014-04-08 DIAGNOSIS — Z5181 Encounter for therapeutic drug level monitoring: Secondary | ICD-10-CM

## 2014-04-08 LAB — POCT INR: INR: 1.9

## 2014-04-08 NOTE — Progress Notes (Signed)
Agree with plan 

## 2014-04-08 NOTE — Progress Notes (Signed)
Pre visit review using our clinic review tool, if applicable. No additional management support is needed unless otherwise documented below in the visit note. 

## 2014-04-08 NOTE — Telephone Encounter (Signed)
Pt had appt on Monday with Dr. Dorise HissKollar to recertify O2 necessity.  Form for recert with letter and OV notes faxed back to [employer].

## 2014-04-28 ENCOUNTER — Ambulatory Visit (INDEPENDENT_AMBULATORY_CARE_PROVIDER_SITE_OTHER): Payer: Medicare Other | Admitting: Cardiovascular Disease

## 2014-04-28 ENCOUNTER — Encounter: Payer: Self-pay | Admitting: Cardiovascular Disease

## 2014-04-28 VITALS — BP 146/62 | HR 42 | Ht 66.0 in | Wt 142.0 lb

## 2014-04-28 DIAGNOSIS — I482 Chronic atrial fibrillation: Secondary | ICD-10-CM

## 2014-04-28 DIAGNOSIS — I10 Essential (primary) hypertension: Secondary | ICD-10-CM

## 2014-04-28 DIAGNOSIS — I4821 Permanent atrial fibrillation: Secondary | ICD-10-CM

## 2014-04-28 DIAGNOSIS — I5032 Chronic diastolic (congestive) heart failure: Secondary | ICD-10-CM | POA: Diagnosis not present

## 2014-04-28 DIAGNOSIS — I82409 Acute embolism and thrombosis of unspecified deep veins of unspecified lower extremity: Secondary | ICD-10-CM | POA: Diagnosis not present

## 2014-04-28 NOTE — Patient Instructions (Signed)
Your physician wants you to follow-up in: 6 months  You will receive a reminder letter in the mail two months in advance. If you don't receive a letter, please call our office to schedule the follow-up appointment.  Your physician recommends that you continue on your current medications as directed. Please refer to the Current Medication list given to you today.  

## 2014-04-28 NOTE — Progress Notes (Signed)
CC: blood on toilet paper, hearing loss, follow up atrial fibrillation and diastolic CHF  History of Present Illness: 81 yo female with history of HTN, Hypothyroidism, paroxysmal atrial fibrillation, chronic diastolic CHF, pleural effusion here today for cardiac follow up.  She was admitted to [facility] 02/25/13 with c/o dyspnea, LE edema, cough. She was found to be volume overloaded and felt to have a pneumonia, found to have acute LE DVT, pleural effusion. She was diuresed with IV Lasix and treated for pneumonia. Echo with normal LV size and function, mild AS, mild AI, mild MR. She had rate controlled atrial fibrillation during the hospitalization. She was also found to have a DVT. She was started on Cardizem for rate control of atrial fib and coumadin for anti-coagulation given atrial fib and DVT. Pulmonary followed her in the hospital for a pleural effusion and performed diagnostic thoracentesis. No chest tube was placed. She was [facility] to [facility]. I saw her February 2015 and she had recurrence of lower ext edema. Her Lasix was increased. INR has been followed in [place] primary care office.   She is here today for follow up. She is doing well overall. No chest pain or SOB. She can walk through the house with her cane and actually did laundry yesterday. She has occasional blood on her toilet paper which has been due to chronic hemorrhoids. No LE edema.   Primary Care Physician: Felicity CoyerLeschber  Past Medical History  Diagnosis Date  . Atrial fibrillation     arrythmia  . HTN (hypertension)   . Familial tremor   . Allergic rhinitis, cause unspecified 01/25/2011  . Thyroid disease   . Chronic diastolic CHF (congestive heart failure)     Past Surgical History  Procedure Laterality Date  . Tonsillectomy    . Dilation and curettage of uterus    . Bunionectomy    . Laparoscopic ovarian cystectomy  04/2009    Current Outpatient Prescriptions  Medication Sig Dispense Refill  .  Calcium Carbonate-Vitamin D (CALTRATE 600+D) 600-400 MG-UNIT per tablet Take 1 tablet by mouth 2 (two) times daily.     . furosemide (LASIX) 40 MG tablet TAKE 1 TABLET BY MOUTH ALTERNATING WITH 80 MG DAILY 135 tablet 0  . hydrocortisone (ANUSOL-HC) 2.5 % rectal cream Place 1 application rectally 2 (two) times daily. 30 g 2  . labetalol (NORMODYNE) 200 MG tablet TAKE 1 TABLET BY MOUTH TWO TIMES DAILY 180 tablet 0  . loratadine (CLARITIN) 10 MG tablet Take 10 mg by mouth daily.    . methimazole (TAPAZOLE) 5 MG tablet Take 1 tablet (5 mg total) by mouth daily. (Patient not taking: Reported on 02/11/2014) 90 tablet 1  . potassium chloride SA (K-DUR,KLOR-CON) 20 MEQ tablet TAKE 2 TABLETS BY MOUTH TWICE DAILY 360 tablet 0  . warfarin (COUMADIN) 1 MG tablet TAKE AS DIRECTED BY ANTICOAGULATION CLINIC 150 tablet 0  . warfarin (COUMADIN) 1 MG tablet Take as directed by anticoagulation clinic (Patient not taking: Reported on 04/28/2014) 150 tablet 1  . [DISCONTINUED] diltiazem (TIAZAC) 180 MG 24 hr capsule Take 1 capsule (180 mg total) by mouth daily. 30 capsule 11   No current facility-administered medications for this visit.    No Known Allergies  History   Social History  . Marital Status: Widowed    Spouse Name: N/A  . Number of Children: N/A  . Years of Education: N/A   Occupational History  . Not on file.   Social History Main Topics  .  Smoking status: Never Smoker   . Smokeless tobacco: Never Used  . Alcohol Use: No  . Drug Use: No  . Sexual Activity: Not on file   Other Topics Concern  . Not on file   Social History Narrative   [employer]. 1 year business school. Married 1948-widowed 1981. 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 Just moved to [facility] August '08-living with daughter. Terminal care-does Not want cardiac resuscitation or mechanical ventilation, artifical nutrition or hydration, renal dialysis or major heroic intervention.  Laymen's Guide with forms provided.    Family History  Problem Relation Age of Onset  . Pancreatic cancer Mother   . Coronary artery disease Father   . Diabetes Sister   . Diabetes Brother     Review of Systems:  As stated in the HPI and otherwise negative.   BP 146/62 mmHg  Pulse 42  Ht  (1.676 m)  Wt 142 lb (64.411 kg)  BMI 22.93 kg/m2  Physical Examination: General: Well developed, well nourished, NAD HEENT: OP clear, mucus membranes moist SKIN: warm, dry. No rashes. Neuro: No focal deficits Musculoskeletal: Muscle strength 5/5 all ext Psychiatric: Mood and affect normal Neck: No JVD, no carotid bruits, no thyromegaly, no lymphadenopathy. Lungs:Clear bilaterally, no wheezes, rhonci, crackles Cardiovascular: Irregular irregular No murmurs, gallops or rubs. Abdomen:Soft. Bowel sounds present. Non-tender.  Extremities: 2+ bilateral lower extremity edema.   Echo 02/26/13: Left ventricle: The cavity size was normal. Wall thickness was increased in a pattern of mild LVH. Systolic function was normal. The estimated ejection fraction was in the range of 60% to 65%. Wall motion was normal; there were no regional wall motion abnormalities. - Aortic valve: There was mild stenosis. Mild regurgitation. Valve area: 1.06cm^2(VTI). Valve area: 0.98cm^2 (Vmax). - Mitral valve: Mild regurgitation. - Left atrium: The atrium was moderately dilated. - Right atrium: The atrium was moderately dilated. - Tricuspid valve: Moderate regurgitation. - Pulmonary arteries: PA peak pressure: 42mm Hg (S). - Pericardium, extracardiac: A trivial pericardial effusion was identified posterior to the heart.  Assessment and Plan:   1. Atrial fibrillation: Rate is controlled on beta blocker. Her Cardizem was stopped due to bradycardia. She has been anti-coagulated with coumadin. Her INR is being followed in primary care office. No plans for cardioversion. Last 4 HR checks show HR 45-56 bpm but no  dizziness.   2. Chronic diastolic CHF: Will continue Lasix 40 mg daily alternating with 80 mg on every other day. Weight is stable.   3. HTN: BP controlled. No changes.   4. H/o DVT: Continue coumadin. Her other indication for coumadin is her atrial fibrillation.

## 2014-05-05 ENCOUNTER — Other Ambulatory Visit: Payer: Self-pay | Admitting: Internal Medicine

## 2014-05-05 ENCOUNTER — Other Ambulatory Visit: Payer: Self-pay | Admitting: Cardiovascular Disease

## 2014-05-06 ENCOUNTER — Ambulatory Visit (INDEPENDENT_AMBULATORY_CARE_PROVIDER_SITE_OTHER): Payer: Medicare Other | Admitting: General Practice

## 2014-05-06 DIAGNOSIS — J90 Pleural effusion, not elsewhere classified: Secondary | ICD-10-CM

## 2014-05-06 DIAGNOSIS — Z5181 Encounter for therapeutic drug level monitoring: Secondary | ICD-10-CM | POA: Diagnosis not present

## 2014-05-06 LAB — POCT INR: INR: 2.3

## 2014-05-06 NOTE — Progress Notes (Signed)
Agree with plan 

## 2014-05-06 NOTE — Progress Notes (Signed)
Pre visit review using our clinic review tool, if applicable. No additional management support is needed unless otherwise documented below in the visit note. 

## 2014-05-10 ENCOUNTER — Emergency Department (HOSPITAL_COMMUNITY): Payer: No Typology Code available for payment source

## 2014-05-10 ENCOUNTER — Encounter (HOSPITAL_COMMUNITY): Payer: Self-pay | Admitting: Emergency Medicine

## 2014-05-10 ENCOUNTER — Emergency Department (HOSPITAL_COMMUNITY)
Admission: EM | Admit: 2014-05-10 | Discharge: 2014-05-10 | Disposition: A | Discharge status: Home / Self Care | Payer: No Typology Code available for payment source | Attending: Emergency Medicine | Admitting: Emergency Medicine

## 2014-05-10 DIAGNOSIS — Z7952 Long term (current) use of systemic steroids: Secondary | ICD-10-CM | POA: Insufficient documentation

## 2014-05-10 DIAGNOSIS — I5032 Chronic diastolic (congestive) heart failure: Secondary | ICD-10-CM | POA: Diagnosis not present

## 2014-05-10 DIAGNOSIS — S79911A Unspecified injury of right hip, initial encounter: Secondary | ICD-10-CM | POA: Insufficient documentation

## 2014-05-10 DIAGNOSIS — Z8639 Personal history of other endocrine, nutritional and metabolic disease: Secondary | ICD-10-CM | POA: Diagnosis not present

## 2014-05-10 DIAGNOSIS — M542 Cervicalgia: Secondary | ICD-10-CM | POA: Diagnosis not present

## 2014-05-10 DIAGNOSIS — Y9389 Activity, other specified: Secondary | ICD-10-CM | POA: Insufficient documentation

## 2014-05-10 DIAGNOSIS — Y92410 Unspecified street and highway as the place of occurrence of the external cause: Secondary | ICD-10-CM | POA: Insufficient documentation

## 2014-05-10 DIAGNOSIS — Z8709 Personal history of other diseases of the respiratory system: Secondary | ICD-10-CM | POA: Diagnosis not present

## 2014-05-10 DIAGNOSIS — S0990XA Unspecified injury of head, initial encounter: Secondary | ICD-10-CM | POA: Diagnosis not present

## 2014-05-10 DIAGNOSIS — S199XXA Unspecified injury of neck, initial encounter: Secondary | ICD-10-CM | POA: Diagnosis not present

## 2014-05-10 DIAGNOSIS — Y998 Other external cause status: Secondary | ICD-10-CM | POA: Insufficient documentation

## 2014-05-10 DIAGNOSIS — S51801A Unspecified open wound of right forearm, initial encounter: Secondary | ICD-10-CM | POA: Insufficient documentation

## 2014-05-10 DIAGNOSIS — R6889 Other general symptoms and signs: Secondary | ICD-10-CM | POA: Diagnosis not present

## 2014-05-10 DIAGNOSIS — Z79899 Other long term (current) drug therapy: Secondary | ICD-10-CM | POA: Diagnosis not present

## 2014-05-10 DIAGNOSIS — S161XXA Strain of muscle, fascia and tendon at neck level, initial encounter: Secondary | ICD-10-CM

## 2014-05-10 DIAGNOSIS — I10 Essential (primary) hypertension: Secondary | ICD-10-CM | POA: Insufficient documentation

## 2014-05-10 DIAGNOSIS — I4891 Unspecified atrial fibrillation: Secondary | ICD-10-CM | POA: Diagnosis not present

## 2014-05-10 DIAGNOSIS — Z7901 Long term (current) use of anticoagulants: Secondary | ICD-10-CM | POA: Insufficient documentation

## 2014-05-10 DIAGNOSIS — R51 Headache: Secondary | ICD-10-CM | POA: Diagnosis not present

## 2014-05-10 DIAGNOSIS — Z8669 Personal history of other diseases of the nervous system and sense organs: Secondary | ICD-10-CM | POA: Insufficient documentation

## 2014-05-10 LAB — PROTIME-INR
INR: 2.12 — AB (ref 0.00–1.49)
PROTHROMBIN TIME: 23.9 s — AB (ref 11.6–15.2)

## 2014-05-10 NOTE — Discharge Instructions (Signed)
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
## 2014-05-10 NOTE — ED Notes (Signed)
Patient transported to CT without distress 

## 2014-05-10 NOTE — ED Notes (Signed)
PT ambulated with baseline gait; VSS; A&Ox3; no signs of distress; respirations even and unlabored; skin warm and dry; no questions upon discharge.  

## 2014-05-10 NOTE — ED Notes (Signed)
MD at bedside. 

## 2014-05-23 ENCOUNTER — Other Ambulatory Visit: Payer: Self-pay | Admitting: Internal Medicine

## 2014-06-03 ENCOUNTER — Ambulatory Visit (INDEPENDENT_AMBULATORY_CARE_PROVIDER_SITE_OTHER): Payer: Medicare Other

## 2014-06-03 DIAGNOSIS — Z5181 Encounter for therapeutic drug level monitoring: Secondary | ICD-10-CM

## 2014-06-03 DIAGNOSIS — J90 Pleural effusion, not elsewhere classified: Secondary | ICD-10-CM

## 2014-06-03 LAB — POCT INR: INR: 2.7

## 2014-06-03 NOTE — Progress Notes (Signed)
Pre-visit discussion using our clinic review tool. No additional management support is needed unless otherwise documented below in the visit note.  

## 2014-06-03 NOTE — Progress Notes (Signed)
Agree with plan 

## 2014-06-08 DIAGNOSIS — Z0279 Encounter for issue of other medical certificate: Secondary | ICD-10-CM

## 2014-07-01 ENCOUNTER — Ambulatory Visit (INDEPENDENT_AMBULATORY_CARE_PROVIDER_SITE_OTHER): Payer: Medicare Other | Admitting: General Practice

## 2014-07-01 DIAGNOSIS — Z5181 Encounter for therapeutic drug level monitoring: Secondary | ICD-10-CM

## 2014-07-01 DIAGNOSIS — J90 Pleural effusion, not elsewhere classified: Secondary | ICD-10-CM

## 2014-07-01 DIAGNOSIS — I82409 Acute embolism and thrombosis of unspecified deep veins of unspecified lower extremity: Secondary | ICD-10-CM

## 2014-07-01 LAB — POCT INR: INR: 1.9

## 2014-07-01 NOTE — Progress Notes (Signed)
I have reviewed and agree with the plan. 

## 2014-07-01 NOTE — Progress Notes (Signed)
Pre visit review using our clinic review tool, if applicable. No additional management support is needed unless otherwise documented below in the visit note. 

## 2014-07-29 ENCOUNTER — Ambulatory Visit (INDEPENDENT_AMBULATORY_CARE_PROVIDER_SITE_OTHER): Payer: Medicare Other | Admitting: General Practice

## 2014-07-29 DIAGNOSIS — J90 Pleural effusion, not elsewhere classified: Secondary | ICD-10-CM

## 2014-07-29 DIAGNOSIS — Z5181 Encounter for therapeutic drug level monitoring: Secondary | ICD-10-CM

## 2014-07-29 DIAGNOSIS — I82409 Acute embolism and thrombosis of unspecified deep veins of unspecified lower extremity: Secondary | ICD-10-CM | POA: Diagnosis not present

## 2014-07-29 LAB — POCT INR: INR: 2.3

## 2014-07-29 NOTE — Progress Notes (Signed)
I have reviewed and agree with the plan. 

## 2014-07-29 NOTE — Progress Notes (Signed)
Pre visit review using our clinic review tool, if applicable. No additional management support is needed unless otherwise documented below in the visit note. 

## 2014-08-25 ENCOUNTER — Ambulatory Visit (INDEPENDENT_AMBULATORY_CARE_PROVIDER_SITE_OTHER): Payer: Medicare Other | Admitting: General Practice

## 2014-08-25 DIAGNOSIS — Z5181 Encounter for therapeutic drug level monitoring: Secondary | ICD-10-CM

## 2014-08-25 DIAGNOSIS — J90 Pleural effusion, not elsewhere classified: Secondary | ICD-10-CM | POA: Diagnosis not present

## 2014-08-25 DIAGNOSIS — I82409 Acute embolism and thrombosis of unspecified deep veins of unspecified lower extremity: Secondary | ICD-10-CM | POA: Diagnosis not present

## 2014-08-25 LAB — POCT INR: INR: 2.5

## 2014-08-25 NOTE — Progress Notes (Signed)
Pre visit review using our clinic review tool, if applicable. No additional management support is needed unless otherwise documented below in the visit note. 

## 2014-08-25 NOTE — Progress Notes (Signed)
I have reviewed and agree with the plan. 

## 2014-08-26 ENCOUNTER — Ambulatory Visit: Payer: Medicare Other

## 2014-09-07 ENCOUNTER — Encounter: Payer: Self-pay | Admitting: Internal Medicine

## 2014-09-07 ENCOUNTER — Ambulatory Visit (INDEPENDENT_AMBULATORY_CARE_PROVIDER_SITE_OTHER): Payer: Medicare Other | Admitting: Internal Medicine

## 2014-09-07 VITALS — BP 124/60 | HR 56 | Temp 97.7°F | Ht 66.0 in | Wt 145.5 lb

## 2014-09-07 DIAGNOSIS — R251 Tremor, unspecified: Secondary | ICD-10-CM | POA: Diagnosis not present

## 2014-09-07 DIAGNOSIS — H9193 Unspecified hearing loss, bilateral: Secondary | ICD-10-CM | POA: Diagnosis not present

## 2014-09-07 DIAGNOSIS — Z0000 Encounter for general adult medical examination without abnormal findings: Secondary | ICD-10-CM

## 2014-09-07 DIAGNOSIS — I10 Essential (primary) hypertension: Secondary | ICD-10-CM | POA: Diagnosis not present

## 2014-09-07 DIAGNOSIS — G252 Other specified forms of tremor: Secondary | ICD-10-CM

## 2014-09-07 DIAGNOSIS — G250 Essential tremor: Secondary | ICD-10-CM

## 2014-09-07 DIAGNOSIS — H9190 Unspecified hearing loss, unspecified ear: Secondary | ICD-10-CM | POA: Insufficient documentation

## 2014-09-07 MED ORDER — POTASSIUM CHLORIDE CRYS ER 20 MEQ PO TBCR: 40.0000 meq | EXTENDED_RELEASE_TABLET | Freq: Two times a day (BID) | ORAL | Status: DC

## 2014-09-07 MED ORDER — LABETALOL HCL 200 MG PO TABS: 200.0000 mg | ORAL_TABLET | Freq: Two times a day (BID) | ORAL | Status: DC

## 2014-09-07 NOTE — Assessment & Plan Note (Signed)
BP Readings from Last 3 Encounters:  09/07/14 124/60  05/10/14 155/71  04/28/14 146/62   The current medical regimen is effective;  continue present plan and medications.

## 2014-09-07 NOTE — Addendum Note (Signed)
Addended by: Tonye Becket on: 09/07/2014 09:56 AM   Modules accepted: Kipp Brood

## 2014-09-07 NOTE — Progress Notes (Signed)
Pre visit review using our clinic review tool, if applicable. No additional management support is needed unless otherwise documented below in the visit note. 

## 2014-09-07 NOTE — Assessment & Plan Note (Signed)
ET affecting head/voice > hands/ext -  Cont beta-blocker

## 2014-09-07 NOTE — Assessment & Plan Note (Signed)
Bilateral hearing aids in place since 2012, not working as well as intended Needs follow-up audiology exam, notification of same to Adriana Mccallum at [facility] telephone [phone], fax [phone]
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
## 2014-09-07 NOTE — Patient Instructions (Addendum)
It was good to see you today.  We have reviewed your prior records including labs and tests today  Health Maintenance reviewed - all recommended immunizations and age-appropriate screenings are up-to-date or declined.  We will work with your audiologist to confirm appropriateness of repeat hearing test -please let us know if problems or concerns  Medications reviewed and updated, no changes recommended at this time.  Please schedule followup in 12 months for annual exam and labs, call sooner if problems.  Health Maintenance Adopting a healthy lifestyle and getting preventive care can go a long way to promote health and wellness. Talk with your health care provider about what schedule of regular examinations is right for you. This is a good chance for you to check in with your provider about disease prevention and staying healthy. In between checkups, there are plenty of things you can do on your own. Experts have done a lot of research about which lifestyle changes and preventive measures are most likely to keep you healthy. Ask your health care provider for more information. WEIGHT AND DIET  Eat a healthy diet  Be sure to include plenty of vegetables, fruits, low-fat dairy products, and lean protein.  Do not eat a lot of foods high in solid fats, added sugars, or salt.  Get regular exercise. This is one of the most important things you can do for your health.  Most adults should exercise for at least 150 minutes each week. The exercise should increase your heart rate and make you sweat (moderate-intensity exercise).  Most adults should also do strengthening exercises at least twice a week. This is in addition to the moderate-intensity exercise.  Maintain a healthy weight  Body mass index (BMI) is a measurement that can be used to identify possible weight problems. It estimates body fat based on height and weight. Your health care provider can help determine your BMI and help you  achieve or maintain a healthy weight.  For females 78 years of age and older:   A BMI below 18.5 is considered underweight.  A BMI of 18.5 to 24.9 is normal.  A BMI of 25 to 29.9 is considered overweight.  A BMI of 30 and above is considered obese.  Watch levels of cholesterol and blood lipids  You should start having your blood tested for lipids and cholesterol at 79 years of age, then have this test every 5 years.  You may need to have your cholesterol levels checked more often if:  Your lipid or cholesterol levels are high.  You are older than 79 years of age.  You are at high risk for heart disease.  CANCER SCREENING   Lung Cancer  Lung cancer screening is recommended for adults 22-55 years old who are at high risk for lung cancer because of a history of smoking.  A yearly low-dose CT scan of the lungs is recommended for people who:  Currently smoke.  Have quit within the past 15 years.  Have at least a 30-pack-year history of smoking. A pack year is smoking an average of one pack of cigarettes a day for 1 year.  Yearly screening should continue until it has been 15 years since you quit.  Yearly screening should stop if you develop a health problem that would prevent you from having lung cancer treatment.  Breast Cancer  Practice breast self-awareness. This means understanding how your breasts normally appear and feel.  It also means doing regular breast self-exams. Let your health care provider  know about any changes, no matter how small.  If you are in your 20s or 30s, you should have a clinical breast exam (CBE) by a health care provider every 1-3 years as part of a regular health exam.  If you are 4 or older, have a CBE every year. Also consider having a breast X-ray (mammogram) every year.  If you have a family history of breast cancer, talk to your health care provider about genetic screening.  If you are at high risk for breast cancer, talk to your  health care provider about having an MRI and a mammogram every year.  Breast cancer gene (BRCA) assessment is recommended for women who have family members with BRCA-related cancers. BRCA-related cancers include:  Breast.  Ovarian.  Tubal.  Peritoneal cancers.  Results of the assessment will determine the need for genetic counseling and BRCA1 and BRCA2 testing. Cervical Cancer Routine pelvic examinations to screen for cervical cancer are no longer recommended for nonpregnant women who are considered low risk for cancer of the pelvic organs (ovaries, uterus, and vagina) and who do not have symptoms. A pelvic examination may be necessary if you have symptoms including those associated with pelvic infections. Ask your health care provider if a screening pelvic exam is right for you.   The Pap test is the screening test for cervical cancer for women who are considered at risk.  If you had a hysterectomy for a problem that was not cancer or a condition that could lead to cancer, then you no longer need Pap tests.  If you are older than 65 years, and you have had normal Pap tests for the past 10 years, you no longer need to have Pap tests.  If you have had past treatment for cervical cancer or a condition that could lead to cancer, you need Pap tests and screening for cancer for at least 20 years after your treatment.  If you no longer get a Pap test, assess your risk factors if they change (such as having a new sexual partner). This can affect whether you should start being screened again.  Some women have medical problems that increase their chance of getting cervical cancer. If this is the case for you, your health care provider may recommend more frequent screening and Pap tests.  The human papillomavirus (HPV) test is another test that may be used for cervical cancer screening. The HPV test looks for the virus that can cause cell changes in the cervix. The cells collected during the Pap  test can be tested for HPV.  The HPV test can be used to screen women 43 years of age and older. Getting tested for HPV can extend the interval between normal Pap tests from three to five years.  An HPV test also should be used to screen women of any age who have unclear Pap test results.  After 79 years of age, women should have HPV testing as often as Pap tests.  Colorectal Cancer  This type of cancer can be detected and often prevented.  Routine colorectal cancer screening usually begins at 79 years of age and continues through 80 years of age.  Your health care provider may recommend screening at an earlier age if you have risk factors for colon cancer.  Your health care provider may also recommend using home test kits to check for hidden blood in the stool.  A small camera at the end of a tube can be used to examine your colon directly (  sigmoidoscopy or colonoscopy). This is done to check for the earliest forms of colorectal cancer.  Routine screening usually begins at age 12.  Direct examination of the colon should be repeated every 5-10 years through 79 years of age. However, you may need to be screened more often if early forms of precancerous polyps or small growths are found. Skin Cancer  Check your skin from head to toe regularly.  Tell your health care provider about any new moles or changes in moles, especially if there is a change in a mole's shape or color.  Also tell your health care provider if you have a mole that is larger than the size of a pencil eraser.  Always use sunscreen. Apply sunscreen liberally and repeatedly throughout the day.  Protect yourself by wearing long sleeves, pants, a wide-brimmed hat, and sunglasses whenever you are outside. HEART DISEASE, DIABETES, AND HIGH BLOOD PRESSURE   Have your blood pressure checked at least every 1-2 years. High blood pressure causes heart disease and increases the risk of stroke.  If you are between 84 years  and 63 years old, ask your health care provider if you should take aspirin to prevent strokes.  Have regular diabetes screenings. This involves taking a blood sample to check your fasting blood sugar level.  If you are at a normal weight and have a low risk for diabetes, have this test once every three years after 79 years of age.  If you are overweight and have a high risk for diabetes, consider being tested at a younger age or more often. PREVENTING INFECTION  Hepatitis B  If you have a higher risk for hepatitis B, you should be screened for this virus. You are considered at high risk for hepatitis B if:  You were born in a country where hepatitis B is common. Ask your health care provider which countries are considered high risk.  Your parents were born in a high-risk country, and you have not been immunized against hepatitis B (hepatitis B vaccine).  You have HIV or AIDS.  You use needles to inject street drugs.  You live with someone who has hepatitis B.  You have had sex with someone who has hepatitis B.  You get hemodialysis treatment.  You take certain medicines for conditions, including cancer, organ transplantation, and autoimmune conditions. Hepatitis C  Blood testing is recommended for:  Everyone born from [birth year] through 1963.  Anyone with known risk factors for hepatitis C. Sexually transmitted infections (STIs)  You should be screened for sexually transmitted infections (STIs) including gonorrhea and chlamydia if:  You are sexually active and are younger than 79 years of age.  You are older than 79 years of age and your health care provider tells you that you are at risk for this type of infection.  Your sexual activity has changed since you were last screened and you are at an increased risk for chlamydia or gonorrhea. Ask your health care provider if you are at risk.  If you do not have HIV, but are at risk, it may be recommended that you take a prescription  medicine daily to prevent HIV infection. This is called pre-exposure prophylaxis (PrEP). You are considered at risk if:  You are sexually active and do not regularly use condoms or know the HIV status of your partner(s).  You take drugs by injection.  You are sexually active with a partner who has HIV. Talk with your health care provider about whether you are at  high risk of being infected with HIV. If you choose to begin PrEP, you should first be tested for HIV. You should then be tested every 3 months for as long as you are taking PrEP.  PREGNANCY   If you are premenopausal and you may become pregnant, ask your health care provider about preconception counseling.  If you may become pregnant, take 400 to 800 micrograms (mcg) of folic acid every day.  If you want to prevent pregnancy, talk to your health care provider about birth control (contraception). OSTEOPOROSIS AND MENOPAUSE   Osteoporosis is a disease in which the bones lose minerals and strength with aging. This can result in serious bone fractures. Your risk for osteoporosis can be identified using a bone density scan.  If you are 24 years of age or older, or if you are at risk for osteoporosis and fractures, ask your health care provider if you should be screened.  Ask your health care provider whether you should take a calcium or vitamin D supplement to lower your risk for osteoporosis.  Menopause may have certain physical symptoms and risks.  Hormone replacement therapy may reduce some of these symptoms and risks. Talk to your health care provider about whether hormone replacement therapy is right for you.  HOME CARE INSTRUCTIONS   Schedule regular health, dental, and eye exams.  Stay current with your immunizations.   Do not use any tobacco products including cigarettes, chewing tobacco, or electronic cigarettes.  If you are pregnant, do not drink alcohol.  If you are breastfeeding, limit how much and how often you  drink alcohol.  Limit alcohol intake to no more than 1 drink per day for nonpregnant women. One drink equals 12 ounces of beer, 5 ounces of wine, or 1 ounces of hard liquor.  Do not use street drugs.  Do not share needles.  Ask your health care provider for help if you need support or information about quitting drugs.  Tell your health care provider if you often feel depressed.  Tell your health care provider if you have ever been abused or do not feel safe at home. Document Released: 08/08/2010 Document Revised: 06/09/2013 Document Reviewed: 12/25/2012 [facility] Patient Information 2015 [place], [place]. This information is not intended to replace advice given to you by your health care provider. Make sure you discuss any questions you have with your health care provider.

## 2014-09-18 DIAGNOSIS — H01001 Unspecified blepharitis right upper eyelid: Secondary | ICD-10-CM | POA: Diagnosis not present

## 2014-09-18 DIAGNOSIS — H0012 Chalazion right lower eyelid: Secondary | ICD-10-CM | POA: Diagnosis not present

## 2014-09-18 DIAGNOSIS — H01002 Unspecified blepharitis right lower eyelid: Secondary | ICD-10-CM | POA: Diagnosis not present

## 2014-09-23 ENCOUNTER — Ambulatory Visit (INDEPENDENT_AMBULATORY_CARE_PROVIDER_SITE_OTHER): Payer: Medicare Other | Admitting: General Practice

## 2014-09-23 DIAGNOSIS — I82409 Acute embolism and thrombosis of unspecified deep veins of unspecified lower extremity: Secondary | ICD-10-CM | POA: Diagnosis not present

## 2014-09-23 DIAGNOSIS — Z5181 Encounter for therapeutic drug level monitoring: Secondary | ICD-10-CM | POA: Diagnosis not present

## 2014-09-23 DIAGNOSIS — J90 Pleural effusion, not elsewhere classified: Secondary | ICD-10-CM | POA: Diagnosis not present

## 2014-09-23 LAB — POCT INR: INR: 2.3

## 2014-09-23 NOTE — Progress Notes (Signed)
Pre visit review using our clinic review tool, if applicable. No additional management support is needed unless otherwise documented below in the visit note. 

## 2014-09-23 NOTE — Progress Notes (Signed)
I have reviewed and agree with the plan. 

## 2014-09-24 DIAGNOSIS — H903 Sensorineural hearing loss, bilateral: Secondary | ICD-10-CM | POA: Diagnosis not present

## 2014-09-29 ENCOUNTER — Encounter: Payer: Self-pay | Admitting: Internal Medicine

## 2014-10-26 ENCOUNTER — Other Ambulatory Visit: Payer: Self-pay | Admitting: General Practice

## 2014-10-26 ENCOUNTER — Other Ambulatory Visit: Payer: Self-pay | Admitting: Internal Medicine

## 2014-10-26 MED ORDER — WARFARIN SODIUM 1 MG PO TABS: ORAL_TABLET | ORAL | Status: DC

## 2014-10-27 DIAGNOSIS — Z23 Encounter for immunization: Secondary | ICD-10-CM | POA: Diagnosis not present

## 2014-11-03 ENCOUNTER — Ambulatory Visit: Payer: Medicare Other

## 2014-11-10 ENCOUNTER — Ambulatory Visit (INDEPENDENT_AMBULATORY_CARE_PROVIDER_SITE_OTHER): Payer: Medicare Other | Admitting: General Practice

## 2014-11-10 DIAGNOSIS — I82409 Acute embolism and thrombosis of unspecified deep veins of unspecified lower extremity: Secondary | ICD-10-CM | POA: Diagnosis not present

## 2014-11-10 DIAGNOSIS — Z5181 Encounter for therapeutic drug level monitoring: Secondary | ICD-10-CM | POA: Diagnosis not present

## 2014-11-10 LAB — POCT INR: INR: 2.3

## 2014-11-10 NOTE — Progress Notes (Signed)
Pre visit review using our clinic review tool, if applicable. No additional management support is needed unless otherwise documented below in the visit note. 

## 2014-11-11 NOTE — Progress Notes (Signed)
I have reviewed and agree with the plan. 

## 2014-11-12 NOTE — Progress Notes (Signed)
I have reviewed and agree with the plan. 

## 2014-11-24 ENCOUNTER — Encounter: Payer: Self-pay | Admitting: Cardiovascular Disease

## 2014-11-30 ENCOUNTER — Ambulatory Visit (INDEPENDENT_AMBULATORY_CARE_PROVIDER_SITE_OTHER): Payer: Medicare Other | Admitting: Cardiovascular Disease

## 2014-11-30 ENCOUNTER — Encounter: Payer: Self-pay | Admitting: Cardiovascular Disease

## 2014-11-30 VITALS — BP 120/58 | HR 50 | Ht 66.0 in | Wt 145.4 lb

## 2014-11-30 DIAGNOSIS — I5032 Chronic diastolic (congestive) heart failure: Secondary | ICD-10-CM | POA: Diagnosis not present

## 2014-11-30 DIAGNOSIS — I480 Paroxysmal atrial fibrillation: Secondary | ICD-10-CM

## 2014-11-30 DIAGNOSIS — I10 Essential (primary) hypertension: Secondary | ICD-10-CM | POA: Diagnosis not present

## 2014-11-30 NOTE — Progress Notes (Signed)
Chief Complaint  Patient presents with  . Follow-up    HTN, Hypothyroidism, paroxysmal atrial fibrillation      History of Present Illness: 81 yo female with history of HTN, Hypothyroidism, paroxysmal atrial fibrillation, chronic diastolic CHF, pleural effusion here today for cardiac follow up.  She was admitted to [facility] 02/25/13 with c/o dyspnea, LE edema, cough. She was found to be volume overloaded and felt to have a pneumonia, found to have acute LE DVT, pleural effusion. She was diuresed with IV Lasix and treated for pneumonia. Echo with normal LV size and function, mild AS, mild AI, mild MR. She had rate controlled atrial fibrillation during the hospitalization. She was also found to have a DVT. She was started on Cardizem for rate control of atrial fib and coumadin for anti-coagulation given atrial fib and DVT. Pulmonary followed her in the hospital for a pleural effusion and performed diagnostic thoracentesis. No chest tube was placed. She was discharged to [facility] nursing facility. I saw her February 2015 and she had recurrence of lower ext edema. Her Lasix was increased. INR has been followed in [place] primary care office.   She is here today for follow up. She is doing well overall. No chest pain or SOB. She has occasional blood on her toilet paper which has been due to chronic hemorrhoids. No LE edema. No palpitations.   Primary Care Physician: Felicity Coyer  Past Medical History  Diagnosis Date  . Atrial fibrillation (HCC)     chronic anticoag  . HTN (hypertension)   . Familial tremor   . Allergic rhinitis, cause unspecified 01/25/2011  . Thyroid disease   . Chronic diastolic CHF (congestive heart failure) [facility])     Past Surgical History  Procedure Laterality Date  . Tonsillectomy    . Dilation and curettage of uterus    . Bunionectomy    . Laparoscopic ovarian cystectomy  04/2009    Current Outpatient Prescriptions  Medication Sig Dispense Refill  . Calcium  Carbonate-Vitamin D (CALTRATE 600+D) 600-400 MG-UNIT per tablet Take 1 tablet by mouth 2 (two) times daily.     . furosemide (LASIX) 40 MG tablet TAKE 1 TABLET BY MOUTH ALTERNATING WITH 80 MG DAILY 135 tablet 6  . hydrocortisone (ANUSOL-HC) 2.5 % rectal cream Place 1 application rectally 2 (two) times daily. 30 g 2  . labetalol (NORMODYNE) 200 MG tablet Take 1 tablet (200 mg total) by mouth 2 (two) times daily. 180 tablet 1  . loratadine (CLARITIN) 10 MG tablet Take 10 mg by mouth daily.    . potassium chloride SA (K-DUR,KLOR-CON) 20 MEQ tablet Take 2 tablets (40 mEq total) by mouth 2 (two) times daily. 360 tablet 1  . warfarin (COUMADIN) 1 MG tablet 90 day 240 tablet 1  . [DISCONTINUED] diltiazem (TIAZAC) 180 MG 24 hr capsule Take 1 capsule (180 mg total) by mouth daily. 30 capsule 11   No current facility-administered medications for this visit.    No Known Allergies  Social History   Social History  . Marital Status: Widowed    Spouse Name: N/A  . Number of Children: N/A  . Years of Education: N/A   Occupational History  . Not on file.   Social History Main Topics  . Smoking status: Never Smoker   . Smokeless tobacco: Never Used  . Alcohol Use: No  . Drug Use: No  . Sexual Activity: Not on file   Other Topics Concern  . Not on file   Social History  Narrative   [employer]. 1 year business school. Married 1948-widowed 1981. 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 [place]living with daughter. Terminal care-does Not want cardiac resuscitation or mechanical ventilation, artifical nutrition or hydration, renal dialysis or major heroic intervention.     Family History  Problem Relation Age of Onset  . Pancreatic cancer Mother   . Coronary artery disease Father   . Diabetes Sister   . Diabetes Brother     Review of Systems:  As stated in the HPI and otherwise negative.   BP 120/58 mmHg  Pulse 50  Ht 5'  6" (1.676 m)  Wt 145 lb 6.4 oz (65.953 kg)  BMI 23.48 kg/m2  SpO2 98%  Physical Examination: General: Well developed, well nourished, NAD HEENT: OP clear, mucus membranes moist SKIN: warm, dry. No rashes. Neuro: No focal deficits Musculoskeletal: Muscle strength 5/5 all ext Psychiatric: Mood and affect normal Neck: No JVD, no carotid bruits, no thyromegaly, no lymphadenopathy. Lungs:Clear bilaterally, no wheezes, rhonci, crackles Cardiovascular: Irregular irregular No murmurs, gallops or rubs. Abdomen:Soft. Bowel sounds present. Non-tender.  Extremities: 2+ bilateral lower extremity edema.   Echo 02/26/13: Left ventricle: The cavity size was normal. Wall thickness was increased in a pattern of mild LVH. Systolic function was normal. The estimated ejection fraction was in the range of 60% to 65%. Wall motion was normal; there were no regional wall motion abnormalities. - Aortic valve: There was mild stenosis. Mild regurgitation. Valve area: 1.06cm^2(VTI). Valve area: 0.98cm^2 (Vmax). - Mitral valve: Mild regurgitation. - Left atrium: The atrium was moderately dilated. - Right atrium: The atrium was moderately dilated. - Tricuspid valve: Moderate regurgitation. - Pulmonary arteries: PA peak pressure: 42mm Hg (S). - Pericardium, extracardiac: A trivial pericardial effusion was identified posterior to the heart.  EKG:  EKG is not ordered today. The ekg ordered today demonstrates   Recent Labs: 04/06/2014: TSH 0.25*   Lipid Panel    Component Value Date/Time   CHOL 119 03/04/2013 1639     Wt Readings from Last 3 Encounters:  11/30/14 145 lb 6.4 oz (65.953 kg)  09/07/14 145 lb 8 oz (65.998 kg)  04/28/14 142 lb (64.411 kg)     Other studies Reviewed: Additional studies/ records that were reviewed today include: . Review of the above records demonstrates:    Assessment and Plan:   1. Atrial fibrillation: She appears to be in sinus today but EKG not obtained. Her  Cardizem was stopped due to bradycardia. She is on a beta blocker. She has been anti-coagulated with coumadin. Her INR is being followed in primary care office.   2. Chronic diastolic CHF: Will continue Lasix 40 mg daily alternating with 80 mg on every other day. Weight is stable.   3. HTN: BP controlled. No changes.   4. H/o DVT: Continue coumadin. Her other indication for coumadin is her atrial fibrillation.   Current medicines are reviewed at length with the patient today.  The patient does not have concerns regarding medicines.  The following changes have been made:  no change  Labs/ tests ordered today include:  No orders of the defined types were placed in this encounter.    Disposition:   FU with me in 12 months  Signed, Verne Carrowhristopher McAlhany, MD 11/30/2014 4:53 PM    [facility] [address], [place], [place]  [address] Phone: [phone]; Fax: [phone]

## 2014-11-30 NOTE — Patient Instructions (Signed)

## 2014-12-18 DIAGNOSIS — H04123 Dry eye syndrome of bilateral lacrimal glands: Secondary | ICD-10-CM | POA: Diagnosis not present

## 2014-12-18 DIAGNOSIS — H353131 Nonexudative age-related macular degeneration, bilateral, early dry stage: Secondary | ICD-10-CM | POA: Diagnosis not present

## 2014-12-18 DIAGNOSIS — Z961 Presence of intraocular lens: Secondary | ICD-10-CM | POA: Diagnosis not present

## 2014-12-18 DIAGNOSIS — H16103 Unspecified superficial keratitis, bilateral: Secondary | ICD-10-CM | POA: Diagnosis not present

## 2014-12-22 ENCOUNTER — Ambulatory Visit (INDEPENDENT_AMBULATORY_CARE_PROVIDER_SITE_OTHER): Payer: Medicare Other | Admitting: General Practice

## 2014-12-22 DIAGNOSIS — J90 Pleural effusion, not elsewhere classified: Secondary | ICD-10-CM | POA: Diagnosis not present

## 2014-12-22 DIAGNOSIS — Z5181 Encounter for therapeutic drug level monitoring: Secondary | ICD-10-CM

## 2014-12-22 LAB — POCT INR: INR: 2.5

## 2014-12-22 NOTE — Progress Notes (Signed)
Pre visit review using our clinic review tool, if applicable. No additional management support is needed unless otherwise documented below in the visit note. 

## 2014-12-22 NOTE — Progress Notes (Signed)
I have reviewed and agree with the plan. 

## 2015-02-02 ENCOUNTER — Ambulatory Visit (INDEPENDENT_AMBULATORY_CARE_PROVIDER_SITE_OTHER): Payer: Medicare Other | Admitting: General Practice

## 2015-02-02 DIAGNOSIS — Z5181 Encounter for therapeutic drug level monitoring: Secondary | ICD-10-CM | POA: Diagnosis not present

## 2015-02-02 DIAGNOSIS — J90 Pleural effusion, not elsewhere classified: Secondary | ICD-10-CM

## 2015-02-02 LAB — POCT INR: INR: 2.9

## 2015-02-02 NOTE — Progress Notes (Signed)
Pre visit review using our clinic review tool, if applicable. No additional management support is needed unless otherwise documented below in the visit note. 

## 2015-02-12 IMAGING — CR DG CHEST 1V PORT
1 series · 1 of 1 positions shown · non-contrast
Comparison: Prior chest x-ray 03/01/2013; CT chest 03/04/2013

CLINICAL DATA: Status post thoracentesis

EXAM:
PORTABLE CHEST - 1 VIEW

[AP]
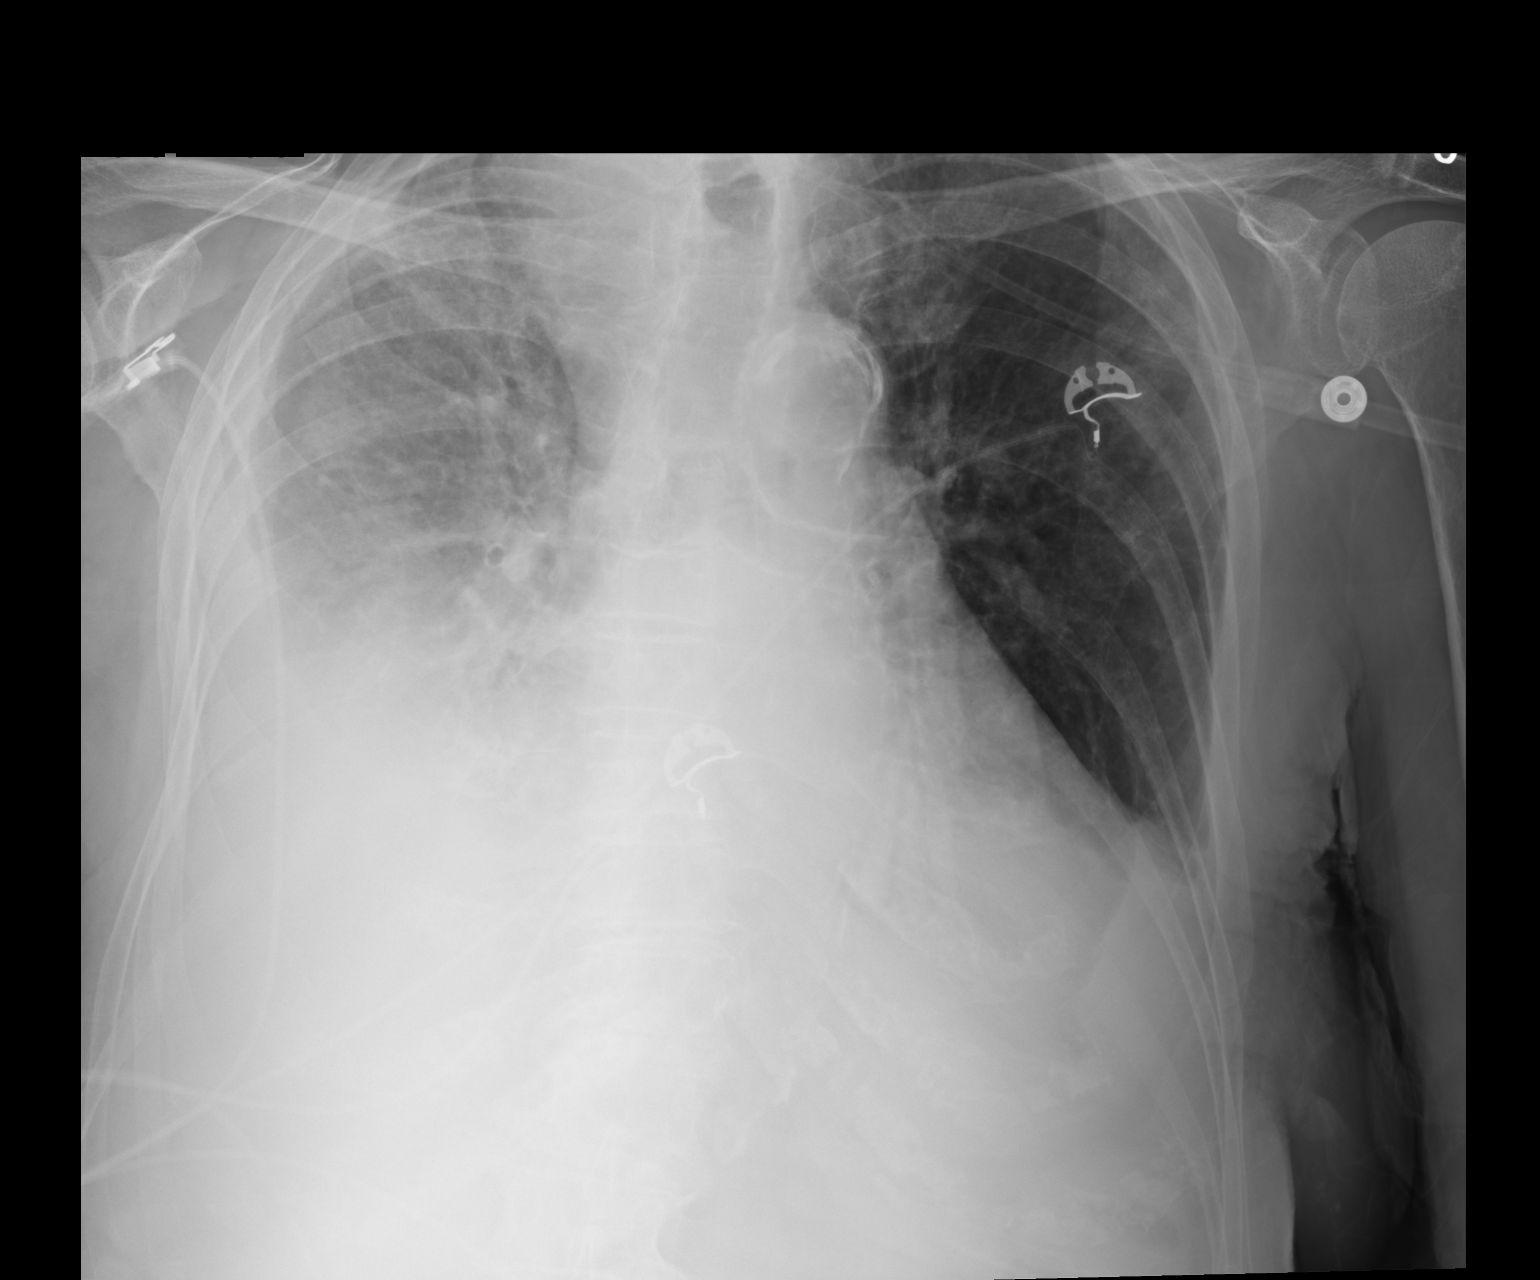

[1 of 1 positions shown; findings below may reference images not displayed]

FINDINGS: No evidence of a pneumothorax. Persistent large right and small to
moderate left layering pleural effusions with associated bibasilar
opacities. Background patchy airspace opacity in the bilateral upper
lungs. Stable cardiomegaly and atherosclerotic and tortuous thoracic
aorta. No acute osseous abnormality.
IMPRESSION: 1. No evidence of pneumothorax.
2. Persistent large right and small to moderate left pleural
effusions with associated bibasilar atelectasis versus infiltrate
3. Stable to slightly improved patchy opacities in the bilateral
upper lungs.

## 2015-02-12 IMAGING — CT CT CHEST W/O CM
2 of 4 series · 15 of 36 positions shown, 18 images · non-contrast
Comparison: 03/01/2013

CLINICAL DATA: Evaluate pneumonia.  Persistent leukocytosis.

EXAM:
CT CHEST WITHOUT CONTRAST
TECHNIQUE: Multidetector CT imaging of the chest was performed following the
standard protocol without IV contrast.

[Series 2: thorax 5.0 i31f 1 · axial · 0.65mm/px · z∈[+1104,+1374]mm · 12 of 64 slices shown, 15 images]
[im 5/64  mediastinal]
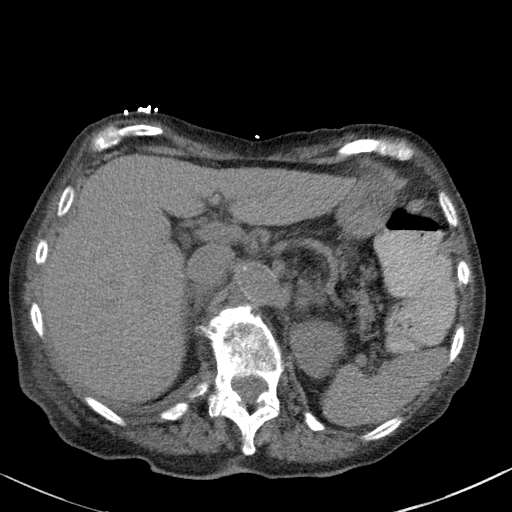
[im 5/64  lung]
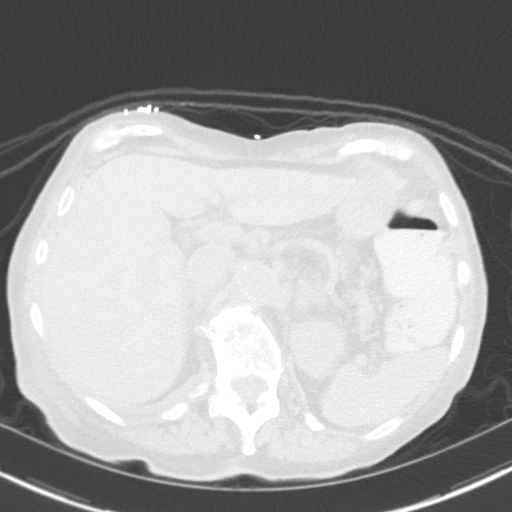
[im 10/64  lung]
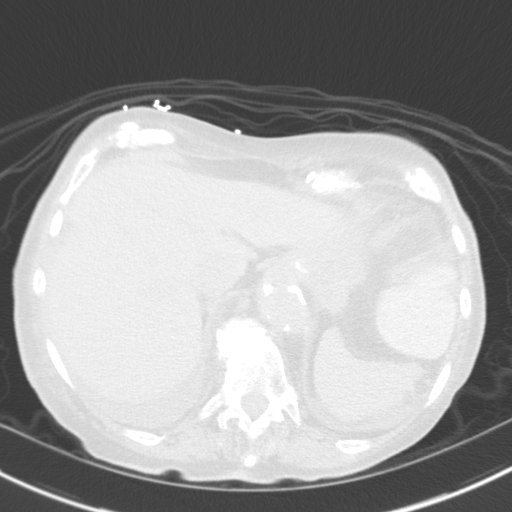
[im 15/64  lung]
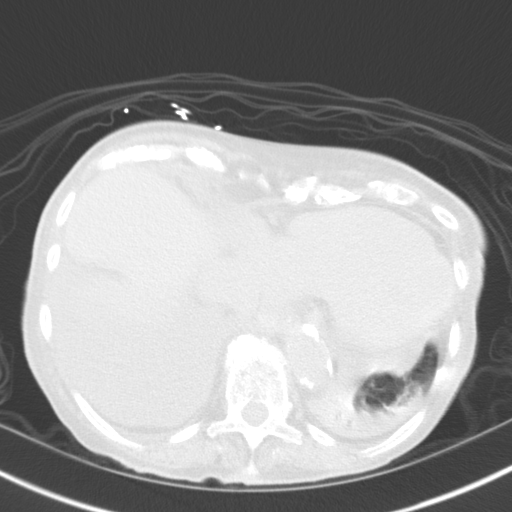
[im 20/64  lung]
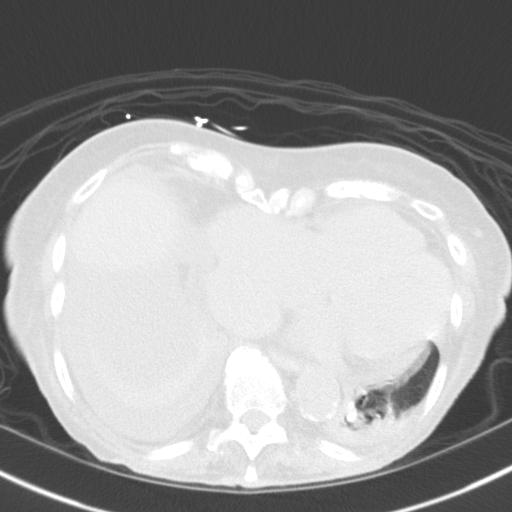
[im 25/64  mediastinal]
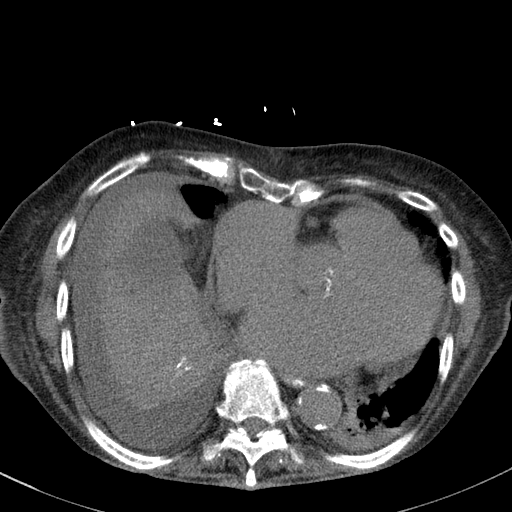
[im 25/64  lung]
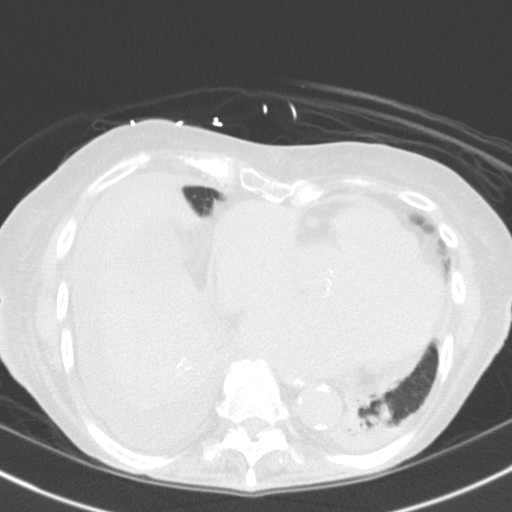
[im 30/64  lung]
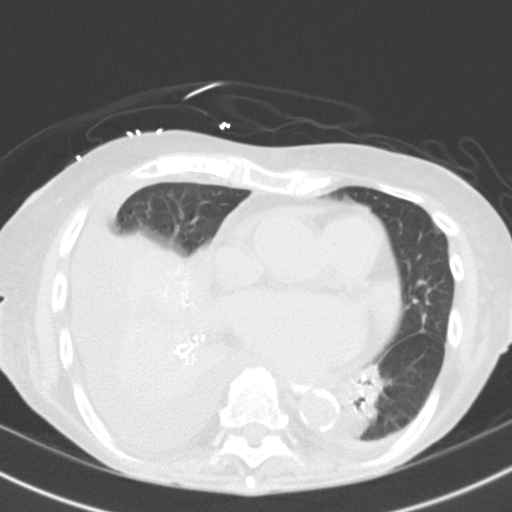
[im 34/64  lung]
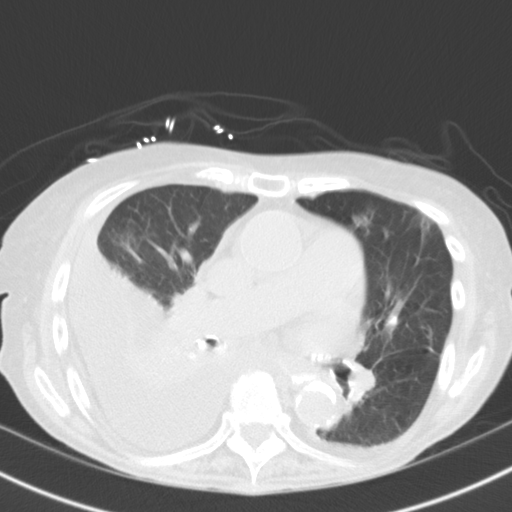
[im 39/64  lung]
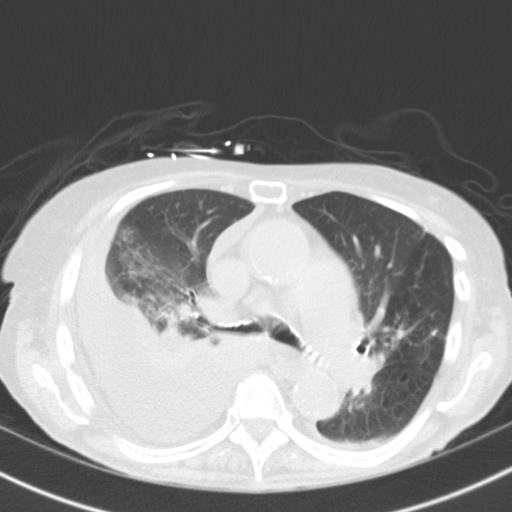
[im 44/64  mediastinal]
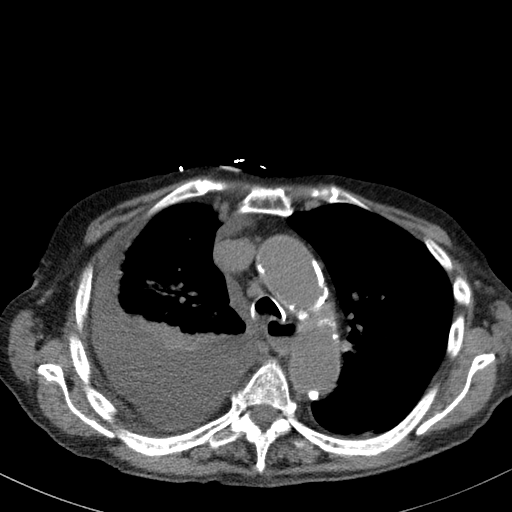
[im 44/64  lung]
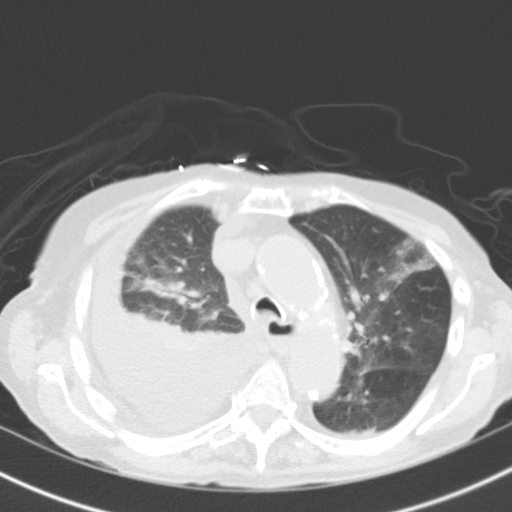
[im 49/64  lung]
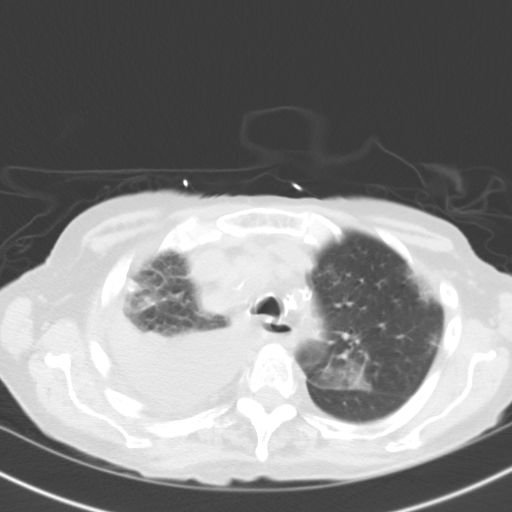
[im 54/64  lung]
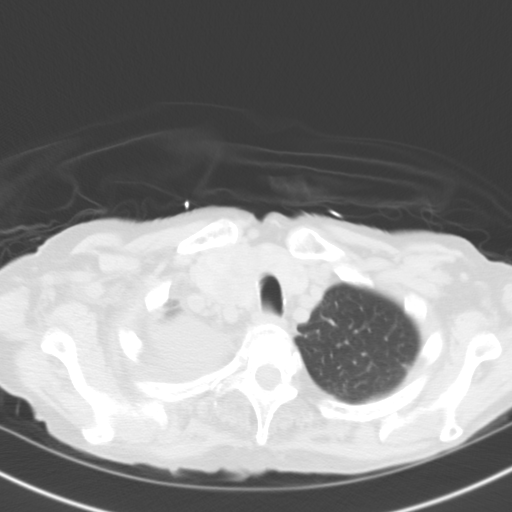
[im 59/64  lung]
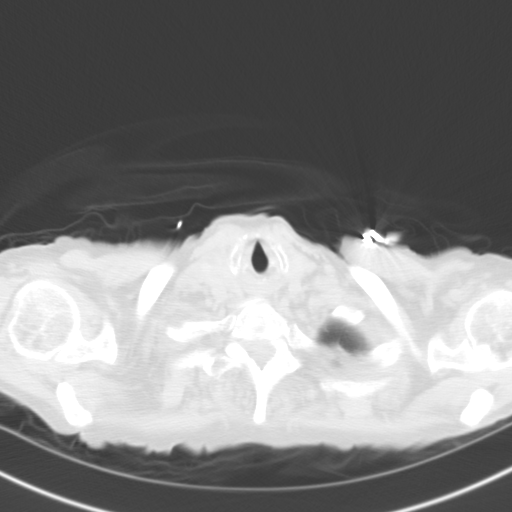

[Series 5: coronal · coronal · 0.63mm/px · 3 of 101 slices shown]
[im 21/101  lung]
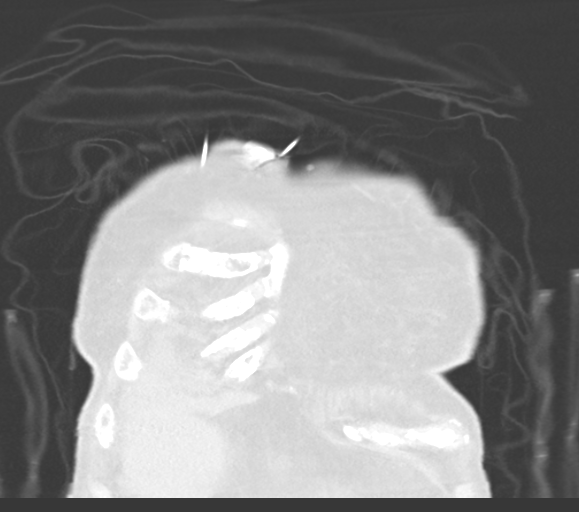
[im 41/101  lung]
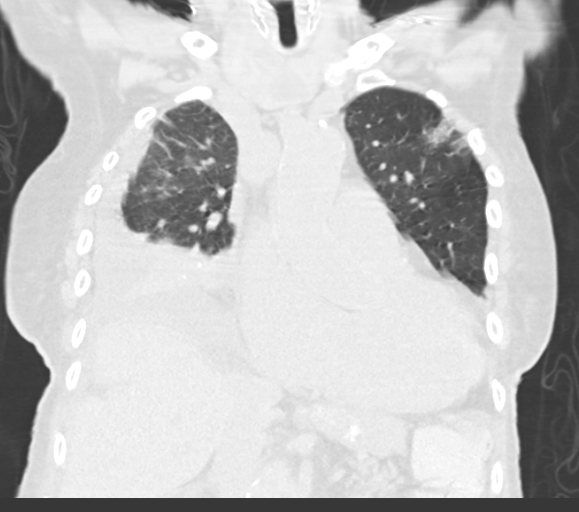
[im 61/101  lung]
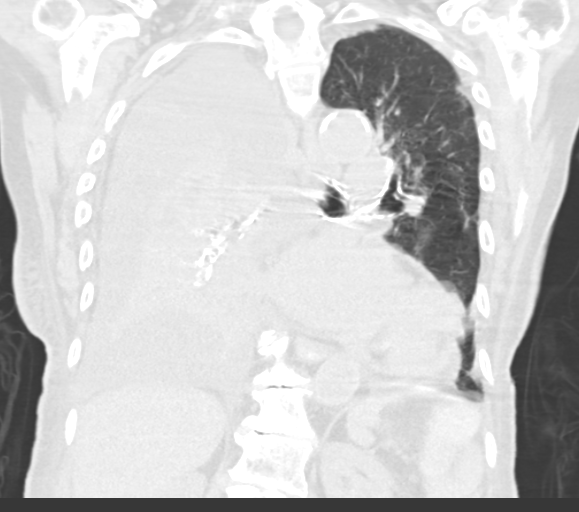

[15 of 36 positions shown; findings below may reference images not displayed]

FINDINGS: There is moderate cardiac enlargement. No pericardial effusion
noted. Calcified atherosclerotic disease affects the thoracic aorta.
Prominent right paratracheal and sub- carinal lymph nodes are noted
without evidence for adenopathy. The trachea appears patent and is
midline. Patulous and mildly dilated esophagus is noted. Enteric
contrast material is identified within the distal portion of the
esophagus.

There is no axillary or supraclavicular adenopathy. Large nodule
arising from the inferior pole of the right lobe of thyroid gland
measures 4.3 cm.

There is a moderate to large right pleural effusion. Small left
effusion is noted. Airspace consolidation involving the right middle
lobe and right lower lobe identified. There are filling defects
within the posterior basal segments of the right lower lobe bronchus
which may reflect mucus plugging. There is mild atelectasis and
consolidation involving the posterior medial left lower lobe. Patchy
airspace consolidation and ground-glass attenuation is noted in the
right upper lobe. There are patchy areas of ground-glass attenuation
within the left upper lobe.

Incidental imaging through the upper abdomen shows no acute
findings.

Review of the visualized osseous structures is significant for
scoliosis and mild multi level spondylosis. No aggressive lytic or
sclerotic bone lesions identified.
IMPRESSION: 1. Moderate to large right pleural effusion. A smaller left effusion
is present.
2. Dense airspace consolidation involves the right middle lobe as
well as the right lower lobe. Patchy areas of airspace in
ground-glass attenuation consolidation within the both upper lobes.
3. Large nodule is noted arising from the inferior pole of the right
lobe of the thyroid gland. Consider further evaluation with thyroid
ultrasound. If patient is clinically hyperthyroid, consider nuclear
medicine thyroid uptake and scan.

## 2015-02-20 DIAGNOSIS — R42 Dizziness and giddiness: Secondary | ICD-10-CM | POA: Diagnosis not present

## 2015-03-17 ENCOUNTER — Ambulatory Visit (INDEPENDENT_AMBULATORY_CARE_PROVIDER_SITE_OTHER): Payer: Medicare Other | Admitting: General Practice

## 2015-03-17 DIAGNOSIS — J90 Pleural effusion, not elsewhere classified: Secondary | ICD-10-CM

## 2015-03-17 DIAGNOSIS — Z5181 Encounter for therapeutic drug level monitoring: Secondary | ICD-10-CM

## 2015-03-17 LAB — POCT INR: INR: 3.1

## 2015-03-17 NOTE — Progress Notes (Signed)
Pre visit review using our clinic review tool, if applicable. No additional management support is needed unless otherwise documented below in the visit note. 

## 2015-03-17 NOTE — Progress Notes (Signed)
I have reviewed and agree with the plan. 

## 2015-04-14 ENCOUNTER — Ambulatory Visit (INDEPENDENT_AMBULATORY_CARE_PROVIDER_SITE_OTHER): Payer: Medicare Other | Admitting: General Practice

## 2015-04-14 DIAGNOSIS — Z5181 Encounter for therapeutic drug level monitoring: Secondary | ICD-10-CM | POA: Diagnosis not present

## 2015-04-14 LAB — POCT INR: INR: 2.7

## 2015-04-14 NOTE — Progress Notes (Signed)
I have reviewed and agree with the plan. 

## 2015-04-14 NOTE — Progress Notes (Signed)
Pre visit review using our clinic review tool, if applicable. No additional management support is needed unless otherwise documented below in the visit note. 

## 2015-05-14 ENCOUNTER — Ambulatory Visit (INDEPENDENT_AMBULATORY_CARE_PROVIDER_SITE_OTHER): Payer: Medicare Other | Admitting: General Practice

## 2015-05-14 DIAGNOSIS — J90 Pleural effusion, not elsewhere classified: Secondary | ICD-10-CM | POA: Diagnosis not present

## 2015-05-14 DIAGNOSIS — Z5181 Encounter for therapeutic drug level monitoring: Secondary | ICD-10-CM | POA: Diagnosis not present

## 2015-05-14 LAB — POCT INR: INR: 2.8

## 2015-05-14 NOTE — Progress Notes (Signed)
Pre visit review using our clinic review tool, if applicable. No additional management support is needed unless otherwise documented below in the visit note. 

## 2015-05-14 NOTE — Progress Notes (Signed)
I have reviewed and agree with the plan. 

## 2015-05-15 ENCOUNTER — Other Ambulatory Visit: Payer: Self-pay | Admitting: Internal Medicine

## 2015-05-24 ENCOUNTER — Other Ambulatory Visit: Payer: Self-pay | Admitting: Internal Medicine

## 2015-05-30 DIAGNOSIS — J069 Acute upper respiratory infection, unspecified: Secondary | ICD-10-CM | POA: Diagnosis not present

## 2015-06-08 ENCOUNTER — Other Ambulatory Visit: Payer: Self-pay | Admitting: Internal Medicine

## 2015-06-11 ENCOUNTER — Ambulatory Visit (INDEPENDENT_AMBULATORY_CARE_PROVIDER_SITE_OTHER): Payer: Medicare Other | Admitting: General Practice

## 2015-06-11 DIAGNOSIS — Z5181 Encounter for therapeutic drug level monitoring: Secondary | ICD-10-CM

## 2015-06-11 DIAGNOSIS — J90 Pleural effusion, not elsewhere classified: Secondary | ICD-10-CM | POA: Diagnosis not present

## 2015-06-11 LAB — POCT INR: INR: 3

## 2015-06-11 NOTE — Progress Notes (Signed)
Pre visit review using our clinic review tool, if applicable. No additional management support is needed unless otherwise documented below in the visit note. 

## 2015-06-29 ENCOUNTER — Encounter: Payer: Self-pay | Admitting: Internal Medicine

## 2015-06-29 ENCOUNTER — Ambulatory Visit (INDEPENDENT_AMBULATORY_CARE_PROVIDER_SITE_OTHER): Payer: Medicare Other | Admitting: Internal Medicine

## 2015-06-29 ENCOUNTER — Other Ambulatory Visit (INDEPENDENT_AMBULATORY_CARE_PROVIDER_SITE_OTHER): Payer: Medicare Other

## 2015-06-29 VITALS — BP 122/56 | HR 83 | Temp 97.5°F | Wt 148.5 lb

## 2015-06-29 DIAGNOSIS — E0590 Thyrotoxicosis, unspecified without thyrotoxic crisis or storm: Secondary | ICD-10-CM

## 2015-06-29 DIAGNOSIS — I82499 Acute embolism and thrombosis of other specified deep vein of unspecified lower extremity: Secondary | ICD-10-CM

## 2015-06-29 DIAGNOSIS — I10 Essential (primary) hypertension: Secondary | ICD-10-CM | POA: Diagnosis not present

## 2015-06-29 DIAGNOSIS — L409 Psoriasis, unspecified: Secondary | ICD-10-CM | POA: Diagnosis not present

## 2015-06-29 DIAGNOSIS — I480 Paroxysmal atrial fibrillation: Secondary | ICD-10-CM

## 2015-06-29 DIAGNOSIS — Z9981 Dependence on supplemental oxygen: Secondary | ICD-10-CM

## 2015-06-29 LAB — COMPREHENSIVE METABOLIC PANEL
ALT: 9 U/L (ref 0–35)
AST: 13 U/L (ref 0–37)
Albumin: 4.1 g/dL (ref 3.5–5.2)
Alkaline Phosphatase: 58 U/L (ref 39–117)
BUN: 26 mg/dL — ABNORMAL HIGH (ref 6–23)
CALCIUM: 9.7 mg/dL (ref 8.4–10.5)
CHLORIDE: 109 meq/L (ref 96–112)
CO2: 26 meq/L (ref 19–32)
CREATININE: 1.23 mg/dL — AB (ref 0.40–1.20)
GFR: 42.91 mL/min — ABNORMAL LOW (ref >60.00)
Glucose, Bld: 118 mg/dL — ABNORMAL HIGH (ref 70–99)
POTASSIUM: 4.7 meq/L (ref 3.5–5.1)
Sodium: 140 mEq/L (ref 135–145)
Total Bilirubin: 0.9 mg/dL (ref 0.2–1.2)
Total Protein: 6.2 g/dL (ref 6.0–8.3)

## 2015-06-29 LAB — TSH: TSH: 0.12 u[IU]/mL — ABNORMAL LOW (ref 0.35–4.50)

## 2015-06-29 MED ORDER — TRIAMCINOLONE ACETONIDE 0.025 % EX OINT: 1.0000 "application " | TOPICAL_OINTMENT | Freq: Two times a day (BID) | CUTANEOUS | Status: DC

## 2015-06-29 MED ORDER — POTASSIUM CHLORIDE CRYS ER 20 MEQ PO TBCR: 40.0000 meq | EXTENDED_RELEASE_TABLET | Freq: Two times a day (BID) | ORAL | Status: DC

## 2015-06-29 NOTE — Patient Instructions (Addendum)
  Test(s) ordered today. Your results will be released to MyChart (or called to you) after review, usually within 72hours after test completion. If any changes need to be made, you will be notified at that same time.   Medications reviewed and updated.  Changes include starting a steroid cream as needed.   Your prescription(s) have been submitted to your pharmacy. Please take as directed and contact our office if you believe you are having problem(s) with the medication(s).   Please followup in  One year

## 2015-06-29 NOTE — Progress Notes (Signed)
Pre visit review using our clinic review tool, if applicable. No additional management support is needed unless otherwise documented below in the visit note. 

## 2015-06-29 NOTE — Assessment & Plan Note (Signed)
On coumadin 

## 2015-06-29 NOTE — Assessment & Plan Note (Signed)
BP well controlled Current regimen effective and well tolerated Continue current medications at current doses cmp  

## 2015-06-29 NOTE — Progress Notes (Signed)
Subjective:    Patient ID: Danielle Adams, female    DOB: August 23, 1916, 82 y.o.   MRN: [id number]  HPI She is here to establish with a new pcp.  She is here for follow up.  Lives with daughter.  Dresses herself.  She uses a walker or cane.  She takes and manages her own medication.   There are no memory issues.   Hypertension: She is taking her medication daily. She is compliant with a low sodium diet.  She denies chest pain, palpitations, shortness of breath and regular headaches. She has occaeional headaches.  She has mild leg edema and takes lasix daily.  She is not exercising regularly.  She does not monitor her blood pressure at home.    Chronic diastolic heart failure:  She is taking her medication daily.  She denies shortness of breath.  She has chronic mild leg edema that is stable.    Afib, History of DVT:  She had a dvt years ago and was placed on coumadin.  She also has a history of Afib.  She follows with Arline Aspindy in our office for her coumadin.    Uses oxygen at night and not during the day.  She was placed on oxygen after she was hospitalized with pneumonia.    Hyperthyroidism:  She was on medication for her thyroid in the past, but was taken off of it years ago. She denies palpitations.  She tends to have constipation, but occasionally will have diarrhea.   Psoriasis:  She has a long history of psoriasis. She is currently applying eucerin cream.  She was using steroid cream years ago, but has not used it recently.  Her rash is better, but still bothers her.    Medications and allergies reviewed with patient and updated if appropriate.  Patient Active Problem List   Diagnosis Date Noted  . Hearing loss 09/07/2014  . Sciatica 11/26/2013  . Chronic diastolic congestive heart failure (HCC) 10/01/2013  . Hemorrhoids, external 05/01/2013  . Encounter for therapeutic drug monitoring 04/25/2013  . Pleural effusion 03/04/2013  . DVT (deep venous thrombosis) (HCC) 02/28/2013  . DNR  (do not resuscitate) 02/27/2013  . Allergic rhinitis, cause unspecified 01/25/2011  . VENOUS INSUFFICIENCY 06/21/2009  . Aortic valve disorders 01/12/2009  . ATRIAL FIBRILLATION, PAROXYSMAL 12/28/2008  . Essential and other specified forms of tremor 08/05/2008  . THYROID NODULE, RIGHT 12/11/2006  . Hyperthyroidism 12/11/2006  . Essential hypertension 12/11/2006    Current Outpatient Prescriptions on File Prior to Visit  Medication Sig Dispense Refill  . Calcium Carbonate-Vitamin D (CALTRATE 600+D) 600-400 MG-UNIT per tablet Take 1 tablet by mouth 2 (two) times daily.     . furosemide (LASIX) 40 MG tablet TAKE 1 TABLET BY MOUTH ALTERNATING WITH 80 MG DAILY 135 tablet 6  . labetalol (NORMODYNE) 200 MG tablet TAKE 1 TABLET BY MOUTH TWICE DAILY 180 tablet 0  . potassium chloride SA (K-DUR,KLOR-CON) 20 MEQ tablet TAKE 2 TABLETS BY MOUTH TWICE DAILY 120 tablet 0  . warfarin (COUMADIN) 1 MG tablet TAKE AS DIRECTED 240 tablet 0  . [DISCONTINUED] diltiazem (TIAZAC) 180 MG 24 hr capsule Take 1 capsule (180 mg total) by mouth daily. 30 capsule 11   No current facility-administered medications on file prior to visit.    Past Medical History  Diagnosis Date  . Atrial fibrillation (HCC)     chronic anticoag  . HTN (hypertension)   . Familial tremor   . Allergic rhinitis, cause unspecified 01/25/2011  .  Thyroid disease   . Chronic diastolic CHF (congestive heart failure) [facility]     Past Surgical History  Procedure Laterality Date  . Tonsillectomy    . Dilation and curettage of uterus    . Bunionectomy    . Laparoscopic ovarian cystectomy  04/2009    Social History   Social History  . Marital Status: Widowed    Spouse Name: N/A  . Number of Children: N/A  . Years of Education: N/A   Social History Main Topics  . Smoking status: Never Smoker   . Smokeless tobacco: Never Used  . Alcohol Use: No  . Drug Use: No  . Sexual Activity: Not Asked   Other Topics Concern  . None    Social History Narrative   [employer]. 1 year business school. Married 1948-widowed 1981. 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 Just moved to [place] August '08-living with daughter. Terminal care-does Not want cardiac resuscitation or mechanical ventilation, artifical nutrition or hydration, renal dialysis or major heroic intervention.     Family History  Problem Relation Age of Onset  . Pancreatic cancer Mother   . Coronary artery disease Father   . Diabetes Sister   . Diabetes Brother     Review of Systems  Constitutional: Negative for fever and appetite change.  Respiratory: Positive for cough (occ dry at cough night ). Negative for shortness of breath and wheezing.   Cardiovascular: Positive for leg swelling (mild). Negative for chest pain and palpitations.  Gastrointestinal: Positive for constipation. Negative for abdominal pain.  Neurological: Positive for headaches (occasional). Negative for dizziness and light-headedness.       Objective:   Filed Vitals:   06/29/15 1408  BP: 122/56  Pulse: 83  Temp: 97.5 F (36.4 C)   Filed Weights   06/29/15 1408  Weight: 148 lb 8 oz (67.359 kg)   Body mass index is 23.98 kg/(m^2).   Physical Exam Constitutional: Appears well-developed and well-nourished. No distress.  Essential tremor of head and hands.   Neck: Neck supple. No tracheal deviation present. No thyromegaly present.  No carotid bruit. No cervical adenopathy.   Cardiovascular: Normal rate, regular rhythm and normal heart sounds.   3/6 systolic murmur heard.  mild edema Pulmonary/Chest: Effort normal and breath sounds normal. No respiratory distress. No wheezes.  Skin; erythema left inner knee, no scaly plaques     Assessment & Plan:   See Problem List for Assessment and Plan of chronic medical problems.  F/u 1 year

## 2015-06-29 NOTE — Assessment & Plan Note (Signed)
Rate controlled, on coumadin Asymptomatic  Continue current medications

## 2015-06-29 NOTE — Assessment & Plan Note (Signed)
Will start triamcinolone cream - low dose - advised not to use it for more than two weeks Can continue eucerin

## 2015-06-29 NOTE — Assessment & Plan Note (Signed)
Taking labetalol Was on methimazole, but has been off of it for years Check tsh

## 2015-07-02 ENCOUNTER — Telehealth: Payer: Self-pay

## 2015-07-02 MED ORDER — METHIMAZOLE 5 MG PO TABS: 5.0000 mg | ORAL_TABLET | Freq: Every day | ORAL | Status: DC

## 2015-07-02 NOTE — Telephone Encounter (Signed)
Per lab results. PCP has restarted pt on methimazole 5 mg daily for thyroid over producing the thyroid hormone. ERx sent to pof.

## 2015-07-05 ENCOUNTER — Other Ambulatory Visit: Payer: Self-pay | Admitting: Internal Medicine

## 2015-07-05 DIAGNOSIS — E0590 Thyrotoxicosis, unspecified without thyrotoxic crisis or storm: Secondary | ICD-10-CM

## 2015-07-09 ENCOUNTER — Ambulatory Visit (INDEPENDENT_AMBULATORY_CARE_PROVIDER_SITE_OTHER): Payer: Medicare Other | Admitting: General Practice

## 2015-07-09 DIAGNOSIS — J90 Pleural effusion, not elsewhere classified: Secondary | ICD-10-CM

## 2015-07-09 DIAGNOSIS — Z5181 Encounter for therapeutic drug level monitoring: Secondary | ICD-10-CM

## 2015-07-09 LAB — POCT INR: INR: 3.8

## 2015-07-09 NOTE — Progress Notes (Signed)
I have reviewed and agree with the plan. 

## 2015-07-09 NOTE — Progress Notes (Signed)
Pre visit review using our clinic review tool, if applicable. No additional management support is needed unless otherwise documented below in the visit note. 

## 2015-07-21 ENCOUNTER — Other Ambulatory Visit: Payer: Self-pay | Admitting: Cardiovascular Disease

## 2015-07-23 ENCOUNTER — Telehealth: Payer: Self-pay | Admitting: Internal Medicine

## 2015-07-23 NOTE — Telephone Encounter (Signed)
i worry a little about her becoming fluid overloaded again - she might be able to get away with 40 alternating with 60 mg but we need to monitor for fluid overload (shortness of breath, wheeze, edema) - may need to f/u with me or cardio to re-evaluate after changing.

## 2015-07-23 NOTE — Telephone Encounter (Signed)
Spoke with pt. Informed her to call back next week to let us know the progress and we can schedule appt if needed.

## 2015-07-23 NOTE — Telephone Encounter (Signed)
Spoke with pt. She states that on the days that she takes double to amount of lasix it "tears her up" she would like to know if she can take only one every day. Please advise.

## 2015-07-23 NOTE — Telephone Encounter (Signed)
Pt Daughter called and would like to talk to nurse about pt meds.  Pt is having some problem with the meds that she is taking.    Best number [phone]

## 2015-08-06 ENCOUNTER — Ambulatory Visit (INDEPENDENT_AMBULATORY_CARE_PROVIDER_SITE_OTHER): Payer: Medicare Other | Admitting: General Practice

## 2015-08-06 DIAGNOSIS — J90 Pleural effusion, not elsewhere classified: Secondary | ICD-10-CM

## 2015-08-06 DIAGNOSIS — Z5181 Encounter for therapeutic drug level monitoring: Secondary | ICD-10-CM

## 2015-08-06 LAB — POCT INR: INR: 4.2

## 2015-08-06 NOTE — Progress Notes (Signed)
Pre visit review using our clinic review tool, if applicable. No additional management support is needed unless otherwise documented below in the visit note. 

## 2015-08-06 NOTE — Progress Notes (Signed)
I have reviewed and agree with the plan. 

## 2015-08-09 ENCOUNTER — Encounter: Payer: Self-pay | Admitting: Internal Medicine

## 2015-08-09 ENCOUNTER — Ambulatory Visit (INDEPENDENT_AMBULATORY_CARE_PROVIDER_SITE_OTHER): Payer: Medicare Other | Admitting: Internal Medicine

## 2015-08-09 VITALS — BP 116/58 | HR 41 | Temp 97.5°F | Resp 16 | Ht 66.0 in | Wt 146.5 lb

## 2015-08-09 DIAGNOSIS — N3000 Acute cystitis without hematuria: Secondary | ICD-10-CM

## 2015-08-09 DIAGNOSIS — R300 Dysuria: Secondary | ICD-10-CM

## 2015-08-09 MED ORDER — SULFAMETHOXAZOLE-TRIMETHOPRIM 800-160 MG PO TABS: 1.0000 | ORAL_TABLET | Freq: Two times a day (BID) | ORAL | Status: AC

## 2015-08-09 NOTE — Patient Instructions (Signed)
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
## 2015-08-09 NOTE — Progress Notes (Signed)
Pre visit review using our clinic review tool, if applicable. No additional management support is needed unless otherwise documented below in the visit note. 

## 2015-08-11 ENCOUNTER — Telehealth: Payer: Self-pay | Admitting: Emergency Medicine

## 2015-08-20 ENCOUNTER — Ambulatory Visit (INDEPENDENT_AMBULATORY_CARE_PROVIDER_SITE_OTHER): Payer: Medicare Other | Admitting: General Practice

## 2015-08-20 DIAGNOSIS — J90 Pleural effusion, not elsewhere classified: Secondary | ICD-10-CM | POA: Diagnosis not present

## 2015-08-20 DIAGNOSIS — Z5181 Encounter for therapeutic drug level monitoring: Secondary | ICD-10-CM | POA: Diagnosis not present

## 2015-08-20 LAB — POCT INR: INR: 3.8

## 2015-08-20 NOTE — Progress Notes (Signed)
I have reviewed and agree with the plan. 

## 2015-08-20 NOTE — Progress Notes (Signed)
Pre visit review using our clinic review tool, if applicable. No additional management support is needed unless otherwise documented below in the visit note. 

## 2015-08-22 NOTE — Patient Instructions (Addendum)
  We will call you with the results of the urine culture.    Medications reviewed and updated.  No changes recommended at this time.

## 2015-08-22 NOTE — Progress Notes (Signed)
Subjective:    Patient ID: Danielle Adams, female    DOB: 20-Jul-1916, 82 y.o.   MRN: [id number]  HPI She is here for follow up.  She was here 7/3 with dysuria.  At that time she had two weeks of worsening dysuria, frequency and urgency.  Her symptoms were consistent with an infection.  She could not give a urine infection.  She was prescribed Bactrim DS x 5 days. She did take the medication and her symptoms resolved.    She recently had increased urinary frequency, but that has resolved.  Her daughter was concerned she may have another UTI.  She denies increased urinary frequency now, dysuria, abdominal pain, hematuria, fever, and nausea.    She has had more frequent bowel movements with blood in the stool sometimes. .  The stool is solid, not diarrhea.  She sometimes has fecal incontinence.  The blood is not new - she has had this in the past due to hemorrhoids.  She thinks the increased bowel movements are from changes in her diet - she is eating more and trying to eat more fruits.  She denies abdominal pain or fever.  The increased stool has only bee present for one week at most. She denies diarrhea.   Medications and allergies reviewed with patient and updated if appropriate.  Patient Active Problem List   Diagnosis Date Noted  . Dysuria 08/09/2015  . Acute cystitis without hematuria 08/09/2015  . Psoriasis 06/29/2015  . On supplemental oxygen therapy 06/29/2015  . Hearing loss 09/07/2014  . Sciatica 11/26/2013  . Chronic diastolic congestive heart failure (HCC) 10/01/2013  . Hemorrhoids, external 05/01/2013  . Encounter for therapeutic drug monitoring 04/25/2013  . Pleural effusion 03/04/2013  . DVT (deep venous thrombosis) (HCC) 02/28/2013  . DNR (do not resuscitate) 02/27/2013  . Allergic rhinitis, cause unspecified 01/25/2011  . VENOUS INSUFFICIENCY 06/21/2009  . Aortic valve disorders 01/12/2009  . ATRIAL FIBRILLATION, PAROXYSMAL 12/28/2008  . Essential and other  specified forms of tremor 08/05/2008  . THYROID NODULE, RIGHT 12/11/2006  . Hyperthyroidism 12/11/2006  . Essential hypertension 12/11/2006    Current Outpatient Prescriptions on File Prior to Visit  Medication Sig Dispense Refill  . Calcium Carbonate-Vitamin D (CALTRATE 600+D) 600-400 MG-UNIT per tablet Take 1 tablet by mouth 2 (two) times daily.     . furosemide (LASIX) 40 MG tablet TAKE 1 TABLET(40MG ) BY MOUTH ALTERNATING WITH 2 TABLETS(80MG ) DAILY 135 tablet 1  . labetalol (NORMODYNE) 200 MG tablet TAKE 1 TABLET BY MOUTH TWICE DAILY 180 tablet 0  . methimazole (TAPAZOLE) 5 MG tablet Take 1 tablet (5 mg total) by mouth daily. 90 tablet 1  . potassium chloride SA (K-DUR,KLOR-CON) 20 MEQ tablet Take 2 tablets (40 mEq total) by mouth 2 (two) times daily. 120 tablet 5  . triamcinolone (KENALOG) 0.025 % ointment Apply 1 application topically 2 (two) times daily. Do not use for more than 2 weeks at a time. 30 g 3  . warfarin (COUMADIN) 1 MG tablet TAKE AS DIRECTED 240 tablet 0  . [DISCONTINUED] diltiazem (TIAZAC) 180 MG 24 hr capsule Take 1 capsule (180 mg total) by mouth daily. 30 capsule 11   No current facility-administered medications on file prior to visit.    Past Medical History  Diagnosis Date  . Atrial fibrillation (HCC)     chronic anticoag  . HTN (hypertension)   . Familial tremor   . Allergic rhinitis, cause unspecified 01/25/2011  . Thyroid disease   . Chronic  diastolic CHF (congestive heart failure) [facility](HCC)     Past Surgical History  Procedure Laterality Date  . Tonsillectomy    . Dilation and curettage of uterus    . Bunionectomy    . Laparoscopic ovarian cystectomy  04/2009    Social History   Social History  . Marital Status: Widowed    Spouse Name: N/A  . Number of Children: N/A  . Years of Education: N/A   Social History Main Topics  . Smoking status: Never Smoker   . Smokeless tobacco: Never Used  . Alcohol Use: No  . Drug Use: No  . Sexual Activity:  Not Asked   Other Topics Concern  . None   Social History Narrative   [employer]. 1 year business school. Married 1948-widowed 1981. 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 [place]living with daughter. Terminal care-does Not want cardiac resuscitation or mechanical ventilation, artifical nutrition or hydration, renal dialysis or major heroic intervention.     Family History  Problem Relation Age of Onset  . Pancreatic cancer Mother   . Coronary artery disease Father   . Diabetes Sister   . Diabetes Brother     Review of Systems  Constitutional: Negative for fever and appetite change.  Respiratory: Negative for shortness of breath.   Cardiovascular: Positive for leg swelling. Negative for chest pain and palpitations.  Gastrointestinal: Positive for blood in stool (chronic, intermittent - related to hemorrhoids in past). Negative for nausea and abdominal pain.       More frequent stool, but not always  Genitourinary: Negative for dysuria and hematuria.  Neurological: Negative for light-headedness and headaches.       Objective:   Filed Vitals:   08/23/15 1632  BP: 130/62  Pulse: 44  Temp: 98 F (36.7 C)   Filed Weights   08/23/15 1632  Weight: 145 lb (65.772 kg)   Body mass index is 23.41 kg/(m^2).   Physical Exam  Constitutional:  Elderly female in NAD  Eyes: Conjunctivae are normal.  Neck: No tracheal deviation present. No thyromegaly present.  Cardiovascular: Normal rate and regular rhythm.   Murmur (3/6 systolic murmur) heard. Pulmonary/Chest: Effort normal. No respiratory distress. She has no wheezes.  Abdominal: Soft. She exhibits no distension. There is no tenderness.  Musculoskeletal: She exhibits edema.  Lymphadenopathy:    She has no cervical adenopathy.  Skin: Skin is warm and dry.          Assessment & Plan:   See Problem List for Assessment and Plan of chronic medical  problems.

## 2015-08-23 ENCOUNTER — Other Ambulatory Visit (INDEPENDENT_AMBULATORY_CARE_PROVIDER_SITE_OTHER): Payer: Medicare Other

## 2015-08-23 ENCOUNTER — Ambulatory Visit (INDEPENDENT_AMBULATORY_CARE_PROVIDER_SITE_OTHER): Payer: Medicare Other | Admitting: Internal Medicine

## 2015-08-23 ENCOUNTER — Encounter: Payer: Self-pay | Admitting: Internal Medicine

## 2015-08-23 VITALS — BP 130/62 | HR 44 | Temp 98.0°F | Wt 145.0 lb

## 2015-08-23 DIAGNOSIS — N3000 Acute cystitis without hematuria: Secondary | ICD-10-CM

## 2015-08-23 DIAGNOSIS — K5900 Constipation, unspecified: Secondary | ICD-10-CM | POA: Insufficient documentation

## 2015-08-23 DIAGNOSIS — K644 Residual hemorrhoidal skin tags: Secondary | ICD-10-CM

## 2015-08-23 DIAGNOSIS — R194 Change in bowel habit: Secondary | ICD-10-CM | POA: Diagnosis not present

## 2015-08-23 DIAGNOSIS — K648 Other hemorrhoids: Secondary | ICD-10-CM

## 2015-08-23 LAB — URINALYSIS, ROUTINE W REFLEX MICROSCOPIC
Bilirubin Urine: NEGATIVE
Ketones, ur: NEGATIVE
Nitrite: NEGATIVE
SPECIFIC GRAVITY, URINE: 1.015 (ref 1.000–1.030)
TOTAL PROTEIN, URINE-UPE24: NEGATIVE
URINE GLUCOSE: NEGATIVE
UROBILINOGEN UA: 0.2 (ref 0.0–1.0)
pH: 5.5 (ref 5.0–8.0)

## 2015-08-23 LAB — POCT URINALYSIS DIPSTICK
BILIRUBIN UA: NEGATIVE
Blood, UA: POSITIVE
Glucose, UA: NEGATIVE
KETONES UA: NEGATIVE
Nitrite, UA: NEGATIVE
Protein, UA: NEGATIVE
Urobilinogen, UA: 0.2
pH, UA: 5.5

## 2015-08-23 NOTE — Progress Notes (Signed)
Pre visit review using our clinic review tool, if applicable. No additional management support is needed unless otherwise documented below in the visit note. 

## 2015-08-23 NOTE — Assessment & Plan Note (Signed)
Causing some bleeding due to increased bowels - likely related to change in diet Treat symptomatically

## 2015-08-23 NOTE — Assessment & Plan Note (Signed)
Successfully treated with bactrim based on symptoms Urine dip suggestive of uti Will check UA, UCx before treating since she has no symptoms and it may not be a good sample She will call if her symptoms change

## 2015-08-23 NOTE — Assessment & Plan Note (Signed)
Increased recently, not diarrhea No abdominal pain Some blood in stool - likely from hemorrhoids due to increased BM's Has changed her eating recently and that may be the cause For now just monitor Adjust diet  Follow up as needed

## 2015-08-25 LAB — URINE CULTURE

## 2015-08-26 ENCOUNTER — Telehealth: Payer: Self-pay | Admitting: Internal Medicine

## 2015-08-26 NOTE — Telephone Encounter (Signed)
Her urine culture does not show an obvious infection.  There was some bacteria, but it looks like this is from the outside skin.  If she is not having any symptoms no treatment is needed. If she has any symptoms in the future we should recheck her urine.

## 2015-08-27 NOTE — Telephone Encounter (Signed)
Spoke with pts daughter to inform.  

## 2015-08-28 ENCOUNTER — Other Ambulatory Visit: Payer: Self-pay | Admitting: Internal Medicine

## 2015-09-10 ENCOUNTER — Ambulatory Visit (INDEPENDENT_AMBULATORY_CARE_PROVIDER_SITE_OTHER): Payer: Medicare Other | Admitting: General Practice

## 2015-09-10 DIAGNOSIS — J90 Pleural effusion, not elsewhere classified: Secondary | ICD-10-CM

## 2015-09-10 DIAGNOSIS — Z5181 Encounter for therapeutic drug level monitoring: Secondary | ICD-10-CM | POA: Diagnosis not present

## 2015-09-10 LAB — POCT INR: INR: 2.1

## 2015-09-13 NOTE — Telephone Encounter (Signed)
t

## 2015-10-06 ENCOUNTER — Other Ambulatory Visit (INDEPENDENT_AMBULATORY_CARE_PROVIDER_SITE_OTHER): Payer: Medicare Other

## 2015-10-06 DIAGNOSIS — E0590 Thyrotoxicosis, unspecified without thyrotoxic crisis or storm: Secondary | ICD-10-CM | POA: Diagnosis not present

## 2015-10-06 LAB — TSH: TSH: 0.65 u[IU]/mL (ref 0.35–4.50)

## 2015-10-15 ENCOUNTER — Ambulatory Visit (INDEPENDENT_AMBULATORY_CARE_PROVIDER_SITE_OTHER): Payer: Medicare Other | Admitting: General Practice

## 2015-10-15 DIAGNOSIS — J90 Pleural effusion, not elsewhere classified: Secondary | ICD-10-CM

## 2015-10-15 DIAGNOSIS — Z5181 Encounter for therapeutic drug level monitoring: Secondary | ICD-10-CM | POA: Diagnosis not present

## 2015-10-15 LAB — POCT INR: INR: 1.6

## 2015-10-20 ENCOUNTER — Other Ambulatory Visit: Payer: Self-pay | Admitting: Internal Medicine

## 2015-10-20 ENCOUNTER — Other Ambulatory Visit: Payer: Self-pay | Admitting: General Practice

## 2015-10-20 MED ORDER — WARFARIN SODIUM 1 MG PO TABS: ORAL_TABLET | ORAL | 1 refills | Status: DC

## 2015-10-30 DIAGNOSIS — Z23 Encounter for immunization: Secondary | ICD-10-CM | POA: Diagnosis not present

## 2015-11-03 ENCOUNTER — Encounter: Payer: Self-pay | Admitting: Family

## 2015-11-03 ENCOUNTER — Ambulatory Visit (INDEPENDENT_AMBULATORY_CARE_PROVIDER_SITE_OTHER): Payer: Medicare Other | Admitting: General Practice

## 2015-11-03 ENCOUNTER — Ambulatory Visit (INDEPENDENT_AMBULATORY_CARE_PROVIDER_SITE_OTHER): Payer: Medicare Other | Admitting: Family

## 2015-11-03 DIAGNOSIS — R21 Rash and other nonspecific skin eruption: Secondary | ICD-10-CM | POA: Diagnosis not present

## 2015-11-03 DIAGNOSIS — Z5181 Encounter for therapeutic drug level monitoring: Secondary | ICD-10-CM | POA: Diagnosis not present

## 2015-11-03 DIAGNOSIS — J90 Pleural effusion, not elsewhere classified: Secondary | ICD-10-CM | POA: Diagnosis not present

## 2015-11-03 LAB — POCT INR: INR: 1.9

## 2015-11-03 MED ORDER — FLUCONAZOLE 100 MG PO TABS: 100.0000 mg | ORAL_TABLET | ORAL | 0 refills | Status: DC

## 2015-11-03 MED ORDER — METHIMAZOLE 5 MG PO TABS: 5.0000 mg | ORAL_TABLET | Freq: Every day | ORAL | 1 refills | Status: DC

## 2015-11-03 NOTE — Progress Notes (Signed)
Subjective:    Patient ID: Danielle Adams, female    DOB: [birth year], 82 y.o.   MRN: [id number]  Chief Complaint  Patient presents with  . Abrasion    has places on the inside of her thighs, red rash, no pain or itching     HPI:  Danielle Adams is a 82 y.o. female who  has a past medical history of Allergic rhinitis, cause unspecified (01/25/2011); Atrial fibrillation (HCC); Chronic diastolic CHF (congestive heart failure) (HCC); Familial tremor; HTN (hypertension); and Thyroid disease. and presents today for an acute office visit.  This is a new problem. Associated symptoms of a rash located on the inner aspect of her right thigh has been going on for about 6 weeks. Did have a similar spot located on the medial aspect of her left knee that is improved. There is no pain or itchiness. Described as red and some flaking skin.   Allergies  Allergen Reactions  . Claritin [Loratadine] Shortness Of Breath     Outpatient Medications Prior to Visit  Medication Sig Dispense Refill  . Calcium Carbonate-Vitamin D (CALTRATE 600+D) 600-400 MG-UNIT per tablet Take 1 tablet by mouth 2 (two) times daily.     . furosemide (LASIX) 40 MG tablet TAKE 1 TABLET(40MG ) BY MOUTH ALTERNATING WITH 2 TABLETS(80MG ) DAILY 135 tablet 1  . labetalol (NORMODYNE) 200 MG tablet Take 1 tablet (200 mg total) by mouth 2 (two) times daily. 180 tablet 1  . potassium chloride SA (K-DUR,KLOR-CON) 20 MEQ tablet Take 2 tablets (40 mEq total) by mouth 2 (two) times daily. 120 tablet 5  . triamcinolone (KENALOG) 0.025 % ointment Apply 1 application topically 2 (two) times daily. Do not use for more than 2 weeks at a time. 30 g 3  . warfarin (COUMADIN) 1 MG tablet TAKE AS DIRECTED 180 tablet 1  . methimazole (TAPAZOLE) 5 MG tablet Take 1 tablet (5 mg total) by mouth daily. 90 tablet 1   No facility-administered medications prior to visit.      Review of Systems  Constitutional: Negative for chills and fever.  Skin: Positive  for rash.  Neurological: Negative for weakness and numbness.      Objective:    BP 110/64 (BP Location: Left Arm, Patient Position: Sitting, Cuff Size: Normal)   Pulse (!) 35   Temp 97.9 F (36.6 C) (Oral)   Resp 14   Ht 5\' 6"  (1.676 m)   Wt 143 lb (64.9 kg)   SpO2 95%   BMI 23.08 kg/m  Nursing note and vital signs reviewed.  Physical Exam  Constitutional: She is oriented to person, place, and time. She appears well-developed and well-nourished. No distress.  Cardiovascular: Normal rate, regular rhythm, normal heart sounds and intact distal pulses.   Pulmonary/Chest: Effort normal and breath sounds normal.  Neurological: She is alert and oriented to person, place, and time.  Oval-shaped red area with tan center with clearly defined borders and white skin that appears to be peeling across 1/2 of the site. No scaling, tenderness or discharge.   Skin: Skin is warm and dry.  Psychiatric: She has a normal mood and affect. Her behavior is normal. Judgment and thought content normal.       Assessment & Plan:   Problem List Items Addressed This Visit      Musculoskeletal and Integument   Rash and nonspecific skin eruption    Resh appears consistent with possible fungal infection. Start fluconazole with instructions provided to monitor for bleeding secondary to  warfarin use. Continue previously prescribed triamcinolone cream. Follow-up if symptoms worsen or do not improve with consideration for dermatology if needed.      Relevant Medications   fluconazole (DIFLUCAN) 100 MG tablet    Other Visit Diagnoses   None.      I am having Danielle Adams start on fluconazole. I am also having her maintain her Calcium Carbonate-Vitamin D, triamcinolone, potassium chloride SA, furosemide, labetalol, warfarin, and methimazole.   Meds ordered this encounter  Medications  . methimazole (TAPAZOLE) 5 MG tablet    Sig: Take 1 tablet (5 mg total) by mouth daily.    Dispense:  90 tablet     Refill:  1  . fluconazole (DIFLUCAN) 100 MG tablet    Sig: Take 1 tablet (100 mg total) by mouth once a week.    Dispense:  2 tablet    Refill:  0    Order Specific Question:   Supervising Provider    Answer:   Hillard Danker A [[id number]     Follow-up: Return if symptoms worsen or fail to improve.  Jeanine Luz, FNP

## 2015-11-03 NOTE — Assessment & Plan Note (Signed)
Resh appears consistent with possible fungal infection. Start fluconazole with instructions provided to monitor for bleeding secondary to warfarin use. Continue previously prescribed triamcinolone cream. Follow-up if symptoms worsen or do not improve with consideration for dermatology if needed.

## 2015-11-03 NOTE — Patient Instructions (Signed)
Thank you for choosing [facility].  SUMMARY AND INSTRUCTIONS:  Medication:  Please start the fluconazole. Monitor for bleeding.  Continue with the triamcinalone cream.  Your prescription(s) have been submitted to your pharmacy or been printed and provided for you. Please take as directed and contact our office if you believe you are having problem(s) with the medication(s) or have any questions.  Follow up:  If your symptoms worsen or fail to improve, please contact our office for further instruction, or in case of emergency go directly to the emergency room at the closest medical facility.

## 2015-11-04 NOTE — Progress Notes (Signed)
I have reviewed and agree with the plan. 

## 2015-11-05 ENCOUNTER — Ambulatory Visit: Payer: Medicare Other

## 2015-11-30 ENCOUNTER — Encounter: Payer: Self-pay | Admitting: Internal Medicine

## 2015-11-30 ENCOUNTER — Ambulatory Visit (INDEPENDENT_AMBULATORY_CARE_PROVIDER_SITE_OTHER): Payer: Medicare Other | Admitting: Internal Medicine

## 2015-11-30 VITALS — BP 140/88 | HR 38 | Resp 16 | Ht 66.0 in | Wt 142.0 lb

## 2015-11-30 DIAGNOSIS — R001 Bradycardia, unspecified: Secondary | ICD-10-CM | POA: Diagnosis not present

## 2015-11-30 DIAGNOSIS — R21 Rash and other nonspecific skin eruption: Secondary | ICD-10-CM

## 2015-11-30 NOTE — Patient Instructions (Addendum)
  Monitor your legs - if they do not continue to get better please let me know.   No immunizations administered today.   Medications reviewed and updated.  No changes recommended at this time.

## 2015-11-30 NOTE — Progress Notes (Signed)
Pre visit review using our clinic review tool, if applicable. No additional management support is needed unless otherwise documented below in the visit note. 

## 2015-11-30 NOTE — Assessment & Plan Note (Signed)
Her heart rate is low here today. She is currently asymptomatic. She has had a couple of episodes of lightheadedness/dizziness, but these are not frequent. We discussed that her low heart rate could be causing these symptoms and one option would be to decrease her labetalol and her blood pressure increased we may need to add a different medication Her and her daughter both feel that would like to continue her medication as is and monitor for symptoms If lightheadedness/dizziness becomes more frequent or if she has any other side effects asked her to return so that we can discuss changes in her medication

## 2015-11-30 NOTE — Assessment & Plan Note (Signed)
No obvious cause-unlikely to be psoriasis or a fungal infection, but it didn't seem to respond lens to the triamcinolone cream Rash is much improved and continues to improve Since it is asymptomatic no further treatment needed She will continue to monitor and let me know if it does not continue to improve

## 2015-11-30 NOTE — Progress Notes (Signed)
Subjective:    Patient ID: Danielle Adams, female    DOB: 1916-06-25, 82 y.o.   MRN: [id number]  HPI She is here for an acute visit.   At the end of July she developed a rash in the back of her knees. She states the rash did not itch and is not painful. She was wearing knee-high socks at that time, but they were not new when she did not have any rash the front of her knee. She does have a history of psoriasis and initially thought it may have been psoriasis so she started using her triamcinolone cream. She will use the cream helped. The rash healed on the outside, but was not scaly like typical psoriasis rash. She came into see Sharlot Gowda one month ago and he was concerned about a possible fungal infection and prescribed fluconazole, which she took and she does not feel this helped at all.  She still has some residual discoloration higher in her posterior legs and on. She denies any itching or pain. The rash has never been symptomatic and she cannot think of any cause for it.   Her daughter states she has had a couple of episodes of dizziness/lightheadedness-"swimmy headed feeling". This typically goes away with rest. She has only had 2 episodes in the past year and it is not overly concerning. She wonders if it could be related to her low heart rate. She has not had any shortness of breath, chest pain, palpitations or headaches.  She does have chronic lower extremity edema, which is controlled with medication.    Medications and allergies reviewed with patient and updated if appropriate.  Patient Active Problem List   Diagnosis Date Noted  . Rash and nonspecific skin eruption 11/03/2015  . Change in bowel habits 08/23/2015  . Dysuria 08/09/2015  . Acute cystitis without hematuria 08/09/2015  . Psoriasis 06/29/2015  . On supplemental oxygen therapy 06/29/2015  . Hearing loss 09/07/2014  . Sciatica 11/26/2013  . Chronic diastolic congestive heart failure (HCC) 10/01/2013  . Hemorrhoids,  external 05/01/2013  . Encounter for therapeutic drug monitoring 04/25/2013  . Pleural effusion 03/04/2013  . DVT (deep venous thrombosis) (HCC) 02/28/2013  . DNR (do not resuscitate) 02/27/2013  . Allergic rhinitis, cause unspecified 01/25/2011  . VENOUS INSUFFICIENCY 06/21/2009  . Aortic valve disorders 01/12/2009  . ATRIAL FIBRILLATION, PAROXYSMAL 12/28/2008  . Essential and other specified forms of tremor 08/05/2008  . THYROID NODULE, RIGHT 12/11/2006  . Hyperthyroidism 12/11/2006  . Essential hypertension 12/11/2006    Current Outpatient Prescriptions on File Prior to Visit  Medication Sig Dispense Refill  . Calcium Carbonate-Vitamin D (CALTRATE 600+D) 600-400 MG-UNIT per tablet Take 1 tablet by mouth 2 (two) times daily.     . furosemide (LASIX) 40 MG tablet TAKE 1 TABLET(40MG ) BY MOUTH ALTERNATING WITH 2 TABLETS(80MG ) DAILY 135 tablet 1  . labetalol (NORMODYNE) 200 MG tablet Take 1 tablet (200 mg total) by mouth 2 (two) times daily. 180 tablet 1  . methimazole (TAPAZOLE) 5 MG tablet Take 1 tablet (5 mg total) by mouth daily. 90 tablet 1  . potassium chloride SA (K-DUR,KLOR-CON) 20 MEQ tablet Take 2 tablets (40 mEq total) by mouth 2 (two) times daily. 120 tablet 5  . triamcinolone (KENALOG) 0.025 % ointment Apply 1 application topically 2 (two) times daily. Do not use for more than 2 weeks at a time. 30 g 3  . warfarin (COUMADIN) 1 MG tablet TAKE AS DIRECTED 180 tablet 1  . [DISCONTINUED] diltiazem (  TIAZAC) 180 MG 24 hr capsule Take 1 capsule (180 mg total) by mouth daily. 30 capsule 11   No current facility-administered medications on file prior to visit.     Past Medical History:  Diagnosis Date  . Allergic rhinitis, cause unspecified 01/25/2011  . Atrial fibrillation (HCC)    chronic anticoag  . Chronic diastolic CHF (congestive heart failure) (HCC)   . Familial tremor   . HTN (hypertension)   . Thyroid disease     Past Surgical History:  Procedure Laterality Date    . BUNIONECTOMY    . DILATION AND CURETTAGE OF UTERUS    . LAPAROSCOPIC OVARIAN CYSTECTOMY  04/2009  . TONSILLECTOMY      Social History   Social History  . Marital status: Widowed    Spouse name: N/A  . Number of children: N/A  . Years of education: N/A   Social History Main Topics  . Smoking status: Never Smoker  . Smokeless tobacco: Never Used  . Alcohol use No  . Drug use: No  . Sexual activity: Not Asked   Other Topics Concern  . None   Social History Narrative   [employer]. 1 year business school. Married 1948-widowed 1981. 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 [place]living with daughter. Terminal care-does Not want cardiac resuscitation or mechanical ventilation, artifical nutrition or hydration, renal dialysis or major heroic intervention.     Family History  Problem Relation Age of Onset  . Pancreatic cancer Mother   . Coronary artery disease Father   . Diabetes Sister   . Diabetes Brother     Review of Systems  Constitutional: Negative for fever.  Cardiovascular: Positive for leg swelling (chronic, controlled). Negative for chest pain and palpitations.  Skin: Positive for color change and rash.  Neurological: Positive for dizziness and light-headedness (rare - recent episode). Negative for headaches.       Objective:   Vitals:   11/30/15 0932  BP: 140/88  Pulse: (!) 38  Resp: 16   Filed Weights   11/30/15 0932  Weight: 142 lb (64.4 kg)   Body mass index is 22.92 kg/m.   Physical Exam  Constitutional: She appears well-developed and well-nourished. No distress.  Neurological:  Chronic head tremor  Skin: Skin is warm. She is not diaphoretic.  Darkening of her skin posterior upper thighs and distal to right posterior knee, appears to be under the skin almost like a resolving bruise, non-tender, non-raised, non-blanching, no scaling, no skin breaks  Psychiatric: She has a  normal mood and affect. Her behavior is normal. Judgment and thought content normal.          Assessment & Plan:   See Problem List for Assessment and Plan of chronic medical problems.

## 2015-12-01 ENCOUNTER — Ambulatory Visit (INDEPENDENT_AMBULATORY_CARE_PROVIDER_SITE_OTHER): Payer: Medicare Other | Admitting: General Practice

## 2015-12-01 DIAGNOSIS — Z5181 Encounter for therapeutic drug level monitoring: Secondary | ICD-10-CM

## 2015-12-01 DIAGNOSIS — J90 Pleural effusion, not elsewhere classified: Secondary | ICD-10-CM

## 2015-12-01 LAB — POCT INR: INR: 2.2

## 2015-12-01 NOTE — Progress Notes (Signed)
I have reviewed and agree with the plan. 

## 2015-12-01 NOTE — Patient Instructions (Signed)
Pre visit review using our clinic review tool, if applicable. No additional management support is needed unless otherwise documented below in the visit note. 

## 2015-12-18 ENCOUNTER — Other Ambulatory Visit: Payer: Self-pay | Admitting: Internal Medicine

## 2015-12-27 ENCOUNTER — Telehealth: Payer: Self-pay | Admitting: Cardiovascular Disease

## 2015-12-27 NOTE — Telephone Encounter (Signed)
°  New Prob   Daughter states she received recall late for yearly follow up. Offered first available in March. Daughter scheduled appointment but requesting to speak to RN to see if maybe pt should be seen sooner. Please call.

## 2015-12-27 NOTE — Telephone Encounter (Signed)
I spoke with pt's daughter and appt made for pt to see Dr. Clifton JamesMcAlhany on 12/29/15 at 11:45.  Daughter reports pt is not having any heart problems but is due for yearly follow up. March appt cancelled.

## 2015-12-29 ENCOUNTER — Ambulatory Visit (INDEPENDENT_AMBULATORY_CARE_PROVIDER_SITE_OTHER): Payer: Medicare Other | Admitting: General Practice

## 2015-12-29 ENCOUNTER — Ambulatory Visit (INDEPENDENT_AMBULATORY_CARE_PROVIDER_SITE_OTHER): Payer: Medicare Other | Admitting: Cardiovascular Disease

## 2015-12-29 VITALS — BP 144/56 | HR 40 | Ht 68.0 in | Wt 145.4 lb

## 2015-12-29 DIAGNOSIS — I480 Paroxysmal atrial fibrillation: Secondary | ICD-10-CM

## 2015-12-29 DIAGNOSIS — J90 Pleural effusion, not elsewhere classified: Secondary | ICD-10-CM

## 2015-12-29 DIAGNOSIS — I5032 Chronic diastolic (congestive) heart failure: Secondary | ICD-10-CM

## 2015-12-29 DIAGNOSIS — I10 Essential (primary) hypertension: Secondary | ICD-10-CM | POA: Diagnosis not present

## 2015-12-29 DIAGNOSIS — Z5181 Encounter for therapeutic drug level monitoring: Secondary | ICD-10-CM

## 2015-12-29 LAB — POCT INR: INR: 1.9

## 2015-12-29 MED ORDER — LABETALOL HCL 200 MG PO TABS: 100.0000 mg | ORAL_TABLET | Freq: Two times a day (BID) | ORAL | 1 refills | Status: DC

## 2015-12-29 NOTE — Progress Notes (Signed)
Chief Complaint  Patient presents with  . aortic valve disorders  . Atrial Fibrillation      History of Present Illness: 82 yo female with history of HTN, Hypothyroidism, paroxysmal atrial fibrillation, chronic diastolic CHF, pleural effusion here today for cardiac follow up.  She was admitted to [facility] 02/25/13 with c/o dyspnea, LE edema, cough. She was found to be volume overloaded and felt to have a pneumonia. She was also found to have acute LE DVT, pleural effusion. She was diuresed with IV Lasix and treated for pneumonia. Echo with normal LV size and function, mild AS, mild AI, mild MR. She had rate controlled atrial fibrillation during the hospitalization. She was started on Cardizem for rate control of atrial fib and coumadin for anti-coagulation given atrial fib and DVT. Pulmonary followed her in the hospital for a pleural effusion and performed diagnostic thoracentesis. No chest tube was placed. She was discharged to [facility] nursing facility. I saw her February 2015 and she had recurrence of lower ext edema. Her Lasix was increased. INR has been followed in [place] primary care office.   She is here today for follow up. She is doing well overall. No chest pain or SOB. She has occasional blood on her toilet paper which has been due to chronic hemorrhoids. No LE edema. No palpitations. No dizziness, near syncope or syncope.   Primary Care Physician: Pincus Sanes, MD   Past Medical History:  Diagnosis Date  . Allergic rhinitis, cause unspecified 01/25/2011  . Atrial fibrillation (HCC)    chronic anticoag  . Chronic diastolic CHF (congestive heart failure) (HCC)   . Familial tremor   . HTN (hypertension)   . Thyroid disease     Past Surgical History:  Procedure Laterality Date  . BUNIONECTOMY    . DILATION AND CURETTAGE OF UTERUS    . LAPAROSCOPIC OVARIAN CYSTECTOMY  04/2009  . TONSILLECTOMY      Current Outpatient Prescriptions  Medication Sig Dispense Refill  . Calcium  Carbonate-Vitamin D (CALTRATE 600+D) 600-400 MG-UNIT per tablet Take 1 tablet by mouth 2 (two) times daily.     . furosemide (LASIX) 40 MG tablet TAKE 1 TABLET(40MG ) BY MOUTH ALTERNATING WITH 2 TABLETS(80MG ) DAILY 135 tablet 1  . labetalol (NORMODYNE) 200 MG tablet Take 1 tablet (200 mg total) by mouth 2 (two) times daily. 180 tablet 1  . methimazole (TAPAZOLE) 5 MG tablet Take 1 tablet (5 mg total) by mouth daily. 90 tablet 1  . potassium chloride SA (K-DUR,KLOR-CON) 20 MEQ tablet TAKE 2 TABLETS(40 MEQ) BY MOUTH TWICE DAILY 120 tablet 2  . triamcinolone (KENALOG) 0.025 % ointment Apply 1 application topically 2 (two) times daily. Do not use for more than 2 weeks at a time. 30 g 3  . warfarin (COUMADIN) 1 MG tablet TAKE AS DIRECTED 180 tablet 1   No current facility-administered medications for this visit.     Allergies  Allergen Reactions  . Claritin [Loratadine] Shortness Of Breath    Social History   Social History  . Marital status: Widowed    Spouse name: N/A  . Number of children: N/A  . Years of education: N/A   Occupational History  . Not on file.   Social History Main Topics  . Smoking status: Never Smoker  . Smokeless tobacco: Never Used  . Alcohol use No  . Drug use: No  . Sexual activity: Not on file   Other Topics Concern  . Not on file   Social  History Narrative   [employer]. 1 year business school. Married 1948-widowed 1981. 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 [place]living with daughter. Terminal care-does Not want cardiac resuscitation or mechanical ventilation, artifical nutrition or hydration, renal dialysis or major heroic intervention.     Family History  Problem Relation Age of Onset  . Pancreatic cancer Mother   . Coronary artery disease Father   . Diabetes Sister   . Diabetes Brother     Review of Systems:  As stated in the HPI and otherwise negative.   BP (!) 144/56    Pulse (!) 40   Ht 5\' 8"  (1.727 m)   Wt 145 lb 6.4 oz (66 kg)   BMI 22.11 kg/m   Physical Examination: General: Well developed, well nourished, NAD  HEENT: OP clear, mucus membranes moist  SKIN: warm, dry. No rashes. Neuro: No focal deficits  Musculoskeletal: Muscle strength 5/5 all ext  Psychiatric: Mood and affect normal  Neck: No JVD, no carotid bruits, no thyromegaly, no lymphadenopathy.  Lungs:Clear bilaterally, no wheezes, rhonci, crackles Cardiovascular: Irregular irregular No murmurs, gallops or rubs. Abdomen:Soft. Bowel sounds present. Non-tender.  Extremities: 2+ bilateral lower extremity edema.   Echo 02/26/13: Left ventricle: The cavity size was normal. Wall thickness was increased in a pattern of mild LVH. Systolic function was normal. The estimated ejection fraction was in the range of 60% to 65%. Wall motion was normal; there were no regional wall motion abnormalities. - Aortic valve: There was mild stenosis. Mild regurgitation. Valve area: 1.06cm^2(VTI). Valve area: 0.98cm^2 (Vmax). - Mitral valve: Mild regurgitation. - Left atrium: The atrium was moderately dilated. - Right atrium: The atrium was moderately dilated. - Tricuspid valve: Moderate regurgitation. - Pulmonary arteries: PA peak pressure: 42mm Hg (S). - Pericardium, extracardiac: A trivial pericardial effusion was identified posterior to the heart.  EKG:  EKG is ordered today. The ekg ordered today demonstrates Atrial fib, rate 38 bpm.   Recent Labs: 06/29/2015: ALT 9; BUN 26; Creatinine, Ser 1.23; Potassium 4.7; Sodium 140 10/06/2015: TSH 0.65   Lipid Panel    Component Value Date/Time   CHOL 119 03/04/2013 1639     Wt Readings from Last 3 Encounters:  12/29/15 145 lb 6.4 oz (66 kg)  11/30/15 142 lb (64.4 kg)  11/03/15 143 lb (64.9 kg)     Other studies Reviewed: Additional studies/ records that were reviewed today include: . Review of the above records demonstrates:    Assessment and  Plan:   1. Atrial fibrillation, paroxysmal: She is in atrial fib today with heart rate of 38 bpm. She has no dizziness, near syncope or syncope. I will reduce her Labetalol to 100 mg BID. I have discussed her bradycardia and how she is not likely a candidate for a pacemaker given her advanced age. She states that she would not even consider a pacemaker if it was offered. She has been anti-coagulated with coumadin. Her INR is being followed in primary care office. No bleeding issues.   2. Chronic diastolic CHF: No dyspnea or LE edema. Will continue Lasix 40 mg daily alternating with 80 mg on every other day.   3. HTN: BP controlled. No changes.   4. H/o DVT: Continue coumadin. Her other indication for coumadin is her atrial fibrillation.   Current medicines are reviewed at length with the patient today.  The patient does not have concerns regarding medicines.  The following changes have been made:  no change  Labs/ tests ordered today include:  No orders of the defined types were placed in this encounter.   Disposition:   FU with me in 12 months  Signed, Verne Carrowhristopher Ardys Hataway, MD 12/29/2015 12:12 PM    [facility] [address], [place], [place]  [address] Phone: [phone]; Fax: [phone]

## 2015-12-29 NOTE — Patient Instructions (Signed)
Medication Instructions:  Your physician has recommended you make the following change in your medication: Decrease Labetalol to 100 mg by mouth twice daily.    Labwork: none  Testing/Procedures: none  Follow-Up: Your physician recommends that you schedule a follow-up appointment in: 12 months.  Please call us in August 2018 to schedule this appt.    Any Other Special Instructions Will Be Listed Below (If Applicable).  Check heart rate and keep record of readings.  Call us next week with these readings.    If you need a refill on your cardiac medications before your next appointment, please call your pharmacy.

## 2015-12-31 NOTE — Progress Notes (Signed)
I have reviewed and agree with the plan. 

## 2016-01-05 ENCOUNTER — Other Ambulatory Visit: Payer: Self-pay

## 2016-01-05 ENCOUNTER — Telehealth: Payer: Self-pay | Admitting: Cardiovascular Disease

## 2016-01-05 DIAGNOSIS — I5032 Chronic diastolic (congestive) heart failure: Secondary | ICD-10-CM

## 2016-01-05 NOTE — Telephone Encounter (Signed)
New Message  Pt daughter call wanting to give pt bp 150/74 and pulse 52. Please call back if needed

## 2016-01-05 NOTE — Telephone Encounter (Signed)
Pt's daughter called to let Dr. Clifton JamesMcalhany know about pt's pulse after decreasing her Labetalol 200 mg BID dose to 100 mg twice a day. Pt's BP 150/74 pulse is 52 beats/minute, up from 40 beats/minute on 11/22. Daughter states that pt is doing well. Pt does not needs medication refill now because she just got refill of the 200 mg dose and she is cutting the pill in half.

## 2016-01-05 NOTE — Telephone Encounter (Signed)
Great.  Thanks

## 2016-01-13 ENCOUNTER — Other Ambulatory Visit: Payer: Self-pay

## 2016-01-21 ENCOUNTER — Ambulatory Visit (INDEPENDENT_AMBULATORY_CARE_PROVIDER_SITE_OTHER): Payer: Medicare Other | Admitting: General Practice

## 2016-01-21 ENCOUNTER — Other Ambulatory Visit: Payer: Self-pay | Admitting: Cardiovascular Disease

## 2016-01-21 DIAGNOSIS — Z5181 Encounter for therapeutic drug level monitoring: Secondary | ICD-10-CM

## 2016-01-21 DIAGNOSIS — J90 Pleural effusion, not elsewhere classified: Secondary | ICD-10-CM

## 2016-01-21 LAB — POCT INR: INR: 1.9

## 2016-01-23 NOTE — Progress Notes (Signed)
I have reviewed and agree with the plan. 

## 2016-01-26 ENCOUNTER — Ambulatory Visit: Payer: Medicare Other

## 2016-02-18 ENCOUNTER — Ambulatory Visit (INDEPENDENT_AMBULATORY_CARE_PROVIDER_SITE_OTHER): Payer: Medicare Other | Admitting: General Practice

## 2016-02-18 DIAGNOSIS — Z5181 Encounter for therapeutic drug level monitoring: Secondary | ICD-10-CM

## 2016-02-18 DIAGNOSIS — J90 Pleural effusion, not elsewhere classified: Secondary | ICD-10-CM

## 2016-02-18 LAB — POCT INR: INR: 2.3

## 2016-02-18 NOTE — Patient Instructions (Signed)
Pre visit review using our clinic review tool, if applicable. No additional management support is needed unless otherwise documented below in the visit note. 

## 2016-02-19 NOTE — Progress Notes (Signed)
I have reviewed and agree with the plan. 

## 2016-03-17 ENCOUNTER — Ambulatory Visit (INDEPENDENT_AMBULATORY_CARE_PROVIDER_SITE_OTHER): Payer: Medicare Other | Admitting: General Practice

## 2016-03-17 DIAGNOSIS — Z5181 Encounter for therapeutic drug level monitoring: Secondary | ICD-10-CM | POA: Diagnosis not present

## 2016-03-17 LAB — POCT INR: INR: 2.3

## 2016-03-17 NOTE — Progress Notes (Signed)
I have reviewed and agree with the plan. 

## 2016-03-17 NOTE — Patient Instructions (Signed)
Pre visit review using our clinic review tool, if applicable. No additional management support is needed unless otherwise documented below in the visit note. 

## 2016-03-27 ENCOUNTER — Other Ambulatory Visit: Payer: Self-pay | Admitting: Internal Medicine

## 2016-04-07 ENCOUNTER — Ambulatory Visit: Payer: Medicare Other | Admitting: Cardiovascular Disease

## 2016-04-14 ENCOUNTER — Ambulatory Visit: Payer: Medicare Other

## 2016-04-14 ENCOUNTER — Other Ambulatory Visit: Payer: Self-pay | Admitting: Family

## 2016-04-14 ENCOUNTER — Other Ambulatory Visit: Payer: Self-pay | Admitting: Internal Medicine

## 2016-04-14 NOTE — Telephone Encounter (Signed)
Please advise 

## 2016-04-19 ENCOUNTER — Ambulatory Visit (INDEPENDENT_AMBULATORY_CARE_PROVIDER_SITE_OTHER): Payer: Medicare Other | Admitting: General Practice

## 2016-04-19 DIAGNOSIS — Z5181 Encounter for therapeutic drug level monitoring: Secondary | ICD-10-CM

## 2016-04-19 LAB — POCT INR: INR: 2.6

## 2016-04-19 NOTE — Progress Notes (Signed)
I have reviewed and agree with the plan. 

## 2016-04-19 NOTE — Patient Instructions (Signed)
Pre visit review using our clinic review tool, if applicable. No additional management support is needed unless otherwise documented below in the visit note. 

## 2016-05-06 ENCOUNTER — Other Ambulatory Visit: Payer: Self-pay | Admitting: Internal Medicine

## 2016-05-22 DIAGNOSIS — H353132 Nonexudative age-related macular degeneration, bilateral, intermediate dry stage: Secondary | ICD-10-CM | POA: Diagnosis not present

## 2016-05-22 DIAGNOSIS — H52203 Unspecified astigmatism, bilateral: Secondary | ICD-10-CM | POA: Diagnosis not present

## 2016-05-22 DIAGNOSIS — H04123 Dry eye syndrome of bilateral lacrimal glands: Secondary | ICD-10-CM | POA: Diagnosis not present

## 2016-05-22 DIAGNOSIS — H16103 Unspecified superficial keratitis, bilateral: Secondary | ICD-10-CM | POA: Diagnosis not present

## 2016-05-24 ENCOUNTER — Ambulatory Visit (INDEPENDENT_AMBULATORY_CARE_PROVIDER_SITE_OTHER): Payer: Medicare Other | Admitting: General Practice

## 2016-05-24 DIAGNOSIS — Z5181 Encounter for therapeutic drug level monitoring: Secondary | ICD-10-CM

## 2016-05-24 DIAGNOSIS — I82409 Acute embolism and thrombosis of unspecified deep veins of unspecified lower extremity: Secondary | ICD-10-CM

## 2016-05-24 LAB — POCT INR: INR: 2.5

## 2016-05-24 NOTE — Progress Notes (Signed)
I have reviewed and agree with the plan. 

## 2016-05-24 NOTE — Patient Instructions (Signed)
Pre visit review using our clinic review tool, if applicable. No additional management support is needed unless otherwise documented below in the visit note. 

## 2016-06-08 ENCOUNTER — Other Ambulatory Visit: Payer: Self-pay | Admitting: Internal Medicine

## 2016-06-28 ENCOUNTER — Ambulatory Visit (INDEPENDENT_AMBULATORY_CARE_PROVIDER_SITE_OTHER): Payer: Medicare Other | Admitting: General Practice

## 2016-06-28 DIAGNOSIS — Z5181 Encounter for therapeutic drug level monitoring: Secondary | ICD-10-CM

## 2016-06-28 LAB — POCT INR: INR: 4.7

## 2016-06-28 NOTE — Patient Instructions (Signed)
Pre visit review using our clinic review tool, if applicable. No additional management support is needed unless otherwise documented below in the visit note. 

## 2016-06-28 NOTE — Progress Notes (Signed)
I have reviewed and agree with the plan. 

## 2016-07-12 ENCOUNTER — Other Ambulatory Visit: Payer: Self-pay | Admitting: Internal Medicine

## 2016-07-14 ENCOUNTER — Ambulatory Visit (INDEPENDENT_AMBULATORY_CARE_PROVIDER_SITE_OTHER): Payer: Medicare Other | Admitting: General Practice

## 2016-07-14 DIAGNOSIS — Z5181 Encounter for therapeutic drug level monitoring: Secondary | ICD-10-CM | POA: Diagnosis not present

## 2016-07-14 LAB — POCT INR: INR: 2.6

## 2016-07-14 NOTE — Progress Notes (Signed)
I have reviewed and agree with the plan. 

## 2016-07-14 NOTE — Patient Instructions (Signed)
Pre visit review using our clinic review tool, if applicable. No additional management support is needed unless otherwise documented below in the visit note. 

## 2016-07-18 ENCOUNTER — Encounter: Payer: Self-pay | Admitting: Internal Medicine

## 2016-07-18 ENCOUNTER — Ambulatory Visit (INDEPENDENT_AMBULATORY_CARE_PROVIDER_SITE_OTHER): Payer: Medicare Other | Admitting: Internal Medicine

## 2016-07-18 ENCOUNTER — Other Ambulatory Visit (INDEPENDENT_AMBULATORY_CARE_PROVIDER_SITE_OTHER): Payer: Medicare Other

## 2016-07-18 VITALS — BP 154/70 | HR 55 | Temp 97.9°F | Resp 16 | Wt 146.0 lb

## 2016-07-18 DIAGNOSIS — E0590 Thyrotoxicosis, unspecified without thyrotoxic crisis or storm: Secondary | ICD-10-CM | POA: Diagnosis not present

## 2016-07-18 DIAGNOSIS — I10 Essential (primary) hypertension: Secondary | ICD-10-CM

## 2016-07-18 DIAGNOSIS — I480 Paroxysmal atrial fibrillation: Secondary | ICD-10-CM

## 2016-07-18 DIAGNOSIS — Z23 Encounter for immunization: Secondary | ICD-10-CM | POA: Diagnosis not present

## 2016-07-18 DIAGNOSIS — Z0000 Encounter for general adult medical examination without abnormal findings: Secondary | ICD-10-CM

## 2016-07-18 DIAGNOSIS — I5032 Chronic diastolic (congestive) heart failure: Secondary | ICD-10-CM

## 2016-07-18 LAB — COMPREHENSIVE METABOLIC PANEL
ALBUMIN: 4.1 g/dL (ref 3.5–5.2)
ALK PHOS: 71 U/L (ref 39–117)
ALT: 9 U/L (ref 0–35)
AST: 16 U/L (ref 0–37)
BILIRUBIN TOTAL: 0.7 mg/dL (ref 0.2–1.2)
BUN: 26 mg/dL — ABNORMAL HIGH (ref 6–23)
CALCIUM: 9.7 mg/dL (ref 8.4–10.5)
CO2: 30 mEq/L (ref 19–32)
Chloride: 107 mEq/L (ref 96–112)
Creatinine, Ser: 1.03 mg/dL (ref 0.40–1.20)
GFR: 52.54 mL/min — AB (ref >60.00)
GLUCOSE: 125 mg/dL — AB (ref 70–99)
Potassium: 3.9 mEq/L (ref 3.5–5.1)
Sodium: 144 mEq/L (ref 135–145)
TOTAL PROTEIN: 6.6 g/dL (ref 6.0–8.3)

## 2016-07-18 LAB — CBC WITH DIFFERENTIAL/PLATELET
BASOS ABS: 0.1 10*3/uL (ref 0.0–0.1)
Basophils Relative: 0.7 % (ref 0.0–3.0)
Eosinophils Absolute: 0.2 10*3/uL (ref 0.0–0.7)
Eosinophils Relative: 2.5 % (ref 0.0–5.0)
HEMATOCRIT: 40.9 % (ref 36.0–46.0)
HEMOGLOBIN: 13.4 g/dL (ref 12.0–15.0)
LYMPHS PCT: 17.4 % (ref 12.0–46.0)
Lymphs Abs: 1.5 10*3/uL (ref 0.7–4.0)
MCHC: 32.8 g/dL (ref 30.0–36.0)
MCV: 92.1 fl (ref 78.0–100.0)
MONOS PCT: 7.4 % (ref 3.0–12.0)
Monocytes Absolute: 0.7 10*3/uL (ref 0.1–1.0)
Neutro Abs: 6.3 10*3/uL (ref 1.4–7.7)
Neutrophils Relative %: 72 % (ref 43.0–77.0)
Platelets: 197 10*3/uL (ref 150.0–400.0)
RBC: 4.44 Mil/uL (ref 3.87–5.11)
RDW: 15.3 % (ref 11.5–15.5)
WBC: 8.8 10*3/uL (ref 4.0–10.5)

## 2016-07-18 LAB — TSH: TSH: 0.38 u[IU]/mL (ref 0.35–4.50)

## 2016-07-18 MED ORDER — POTASSIUM CHLORIDE CRYS ER 20 MEQ PO TBCR: 40.0000 meq | EXTENDED_RELEASE_TABLET | Freq: Two times a day (BID) | ORAL | 3 refills | Status: DC

## 2016-07-18 NOTE — Patient Instructions (Addendum)
  [name] , Thank you for taking time to come for your Medicare Wellness Visit. I appreciate your ongoing commitment to your health goals. Please review the following plan we discussed and let me know if I can assist you in the future.   These are the goals we discussed: Goals    None      This is a list of the screening recommended for you and due dates:  Health Maintenance  Topic Date Due  . Pneumonia vaccines (2 of 2 - PCV13) today  . Tetanus Vaccine  today  . DEXA scan (bone density measurement)  10/08/2018*  . Flu Shot  09/06/2016  *Topic was postponed. The date shown is not the original due date.     Test(s) ordered today. Your results will be released to MyChart (or called to you) after review, usually within 72hours after test completion. If any changes need to be made, you will be notified at that same time.  All other Health Maintenance issues reviewed.   All recommended immunizations and age-appropriate screenings are up-to-date or discussed.  prevnar and tetanus immunizations administered today.   Medications reviewed and updated.  No changes recommended at this time.    Please followup in one year

## 2016-07-18 NOTE — Assessment & Plan Note (Signed)
Check tsh  Titrate med dose if needed  

## 2016-07-18 NOTE — Progress Notes (Signed)
Subjective:    Patient ID: Danielle Adams, female    DOB: [birth year], 83 y.o.   MRN: [id number]  HPI Here for medicare wellness exam and follow up of her chronic medical problems.   I have personally reviewed and have noted 1.The patient's medical and social history 2.Their use of alcohol, tobacco or illicit drugs 3.Their current medications and supplements 4.The patient's functional ability including ADL's, fall risks, home safety risks and hearing or visual impairment. 5.Diet and physical activities 6.Evidence for depression or mood disorders 7.Care team reviewed and updated -None   Hyperthyroidism: She is not currently taking her medication. She has been out of her medication for over a month. The pharmacy did not refill it and she is unsure why.  We were not contacted by the pharmacy. She denies any symptoms since stopping the medication.  AFib:  She follows with cardiology. She is taking labetalol and warfarin. She denies any chest pain, palpitations and shortness of breath.  Chronic diastolic congestive heart failure: She is taking the Lasix and potassium daily. She takes her labetalol daily. She was hoping she could come off of some of the medication. She denies chest pain, shortness of breath, palpitations and lower extremity edema.  Foot pain: She has pain in her feet from calluses and thickened, elongated toenails. She currently filed her toenails and her calluses. She is not interested in seeing podiatry, wondered if there is anything else she could try.   Are there smokers in your home (other than you)? No  Risk Factors Exercise: None Dietary issues discussed:   Well balanced, 3 meals a day  Cardiac risk factors: advanced age, hypertension  Depression Screen  Have you felt down, depressed or hopeless? No  Have you felt little interest or pleasure in doing things?  No  Activities of Daily Living In your  present state of health, do you have any difficulty performing the following activities?:  Driving? Yes - no longer driving Managing money?  No-manages her own money Feeding yourself? No Getting from bed to chair? No Climbing a flight of stairs? Yes Preparing food and eating?: Yes-no longer cooks, able to eat on her own Bathing or showering? yes Getting dressed: No Getting to/using the toilet? No Moving around from place to place: Yes-uses a walker at home or wheelchair for long distances  In the past year have you fallen or had a near fall?: No   Are you sexually active?  No  Do you have more than one partner?  N/A  Hearing Difficulties: yes - wears hearing aids Do you often ask people to speak up or repeat themselves? yes Do you experience ringing or noises in your ears? No Do you have difficulty understanding soft or whispered voices? yes Vision:              Any change in vision: No             Up to date with eye exam: Up-to-date Memory:  Do you feel that you have a problem with memory? No  Do you often misplace items? No  Do you feel safe at home?  Yes  Cognitive Testing  Alert, Orientated? Yes  Normal Appearance? Yes  Recall of three objects?  Yes  Can perform simple calculations? Jules Husbands does her own finances  Displays appropriate judgment? Yes  Can read the correct time from a watch face? Yes   Advanced Directives have been discussed with the patient? Yes-DO NOT RESUSCITATE   Medications  and allergies reviewed with patient and updated if appropriate.  Patient Active Problem List   Diagnosis Date Noted  . Bradycardia 11/30/2015  . Rash and nonspecific skin eruption 11/03/2015  . Change in bowel habits 08/23/2015  . Psoriasis 06/29/2015  . On supplemental oxygen therapy 06/29/2015  . Hearing loss 09/07/2014  . Sciatica 11/26/2013  . Chronic diastolic congestive heart failure (HCC) 10/01/2013  . Hemorrhoids, external 05/01/2013  . Encounter for therapeutic  drug monitoring 04/25/2013  . Pleural effusion 03/04/2013  . DVT (deep venous thrombosis) (HCC) 02/28/2013  . DNR (do not resuscitate) 02/27/2013  . Allergic rhinitis, cause unspecified 01/25/2011  . VENOUS INSUFFICIENCY 06/21/2009  . Aortic valve disorders 01/12/2009  . ATRIAL FIBRILLATION, PAROXYSMAL 12/28/2008  . Essential and other specified forms of tremor 08/05/2008  . THYROID NODULE, RIGHT 12/11/2006  . Hyperthyroidism 12/11/2006  . Essential hypertension 12/11/2006    Current Outpatient Prescriptions on File Prior to Visit  Medication Sig Dispense Refill  . Calcium Carbonate-Vitamin D (CALTRATE 600+D) 600-400 MG-UNIT per tablet Take 1 tablet by mouth 2 (two) times daily.     . furosemide (LASIX) 40 MG tablet Take 1 tablet by mouth daily alternating with 2 tablets by mouth daily. 135 tablet 3  . labetalol (NORMODYNE) 200 MG tablet Take 0.5 tablets (100 mg total) by mouth 2 (two) times daily. 90 tablet 1  . methimazole (TAPAZOLE) 5 MG tablet TAKE 1 TABLET(5 MG) BY MOUTH DAILY 90 tablet 0  . potassium chloride SA (K-DUR,KLOR-CON) 20 MEQ tablet Take 2 tablets (40 mEq total) by mouth 2 (two) times daily. 120 tablet 0  . triamcinolone (KENALOG) 0.025 % ointment Apply 1 application topically 2 (two) times daily. Do not use for more than 2 weeks at a time. 30 g 3  . warfarin (COUMADIN) 1 MG tablet TAKE AS DIRECTED 180 tablet 0  . [DISCONTINUED] diltiazem (TIAZAC) 180 MG 24 hr capsule Take 1 capsule (180 mg total) by mouth daily. 30 capsule 11   No current facility-administered medications on file prior to visit.     Past Medical History:  Diagnosis Date  . Allergic rhinitis, cause unspecified 01/25/2011  . Atrial fibrillation (HCC)    chronic anticoag  . Chronic diastolic CHF (congestive heart failure) (HCC)   . Familial tremor   . HTN (hypertension)   . Thyroid disease     Past Surgical History:  Procedure Laterality Date  . BUNIONECTOMY    . DILATION AND CURETTAGE OF  UTERUS    . LAPAROSCOPIC OVARIAN CYSTECTOMY  04/2009  . TONSILLECTOMY      Social History   Social History  . Marital status: Widowed    Spouse name: N/A  . Number of children: N/A  . Years of education: N/A   Social History Main Topics  . Smoking status: Never Smoker  . Smokeless tobacco: Never Used  . Alcohol use No  . Drug use: No  . Sexual activity: Not Asked   Other Topics Concern  . None   Social History Narrative   [employer]. 1 year business school. Married 1948-widowed 1981. 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 Just moved to [facility] August '08-living with daughter. Terminal care-does Not want cardiac resuscitation or mechanical ventilation, artifical nutrition or hydration, renal dialysis or major heroic intervention.     Family History  Problem Relation Age of Onset  . Pancreatic cancer Mother   . Coronary artery disease Father   . Diabetes Sister   . Diabetes Brother  Review of Systems  Constitutional: Negative for appetite change, chills and fever.  HENT: Positive for hearing loss. Negative for tinnitus.   Eyes: Negative for visual disturbance.  Respiratory: Negative for cough, shortness of breath and wheezing.   Cardiovascular: Negative for chest pain, palpitations and leg swelling.  Gastrointestinal: Positive for constipation. Negative for abdominal pain and nausea.  Neurological: Negative for light-headedness and headaches.       Objective:   Vitals:   07/18/16 1413  BP: (!) 154/70  Pulse: (!) 55  Resp: 16  Temp: 97.9 F (36.6 C)   Filed Weights   07/18/16 1413  Weight: 146 lb (66.2 kg)   Body mass index is 22.2 kg/m.  Wt Readings from Last 3 Encounters:  07/18/16 146 lb (66.2 kg)  12/29/15 145 lb 6.4 oz (66 kg)  11/30/15 142 lb (64.4 kg)     Physical Exam Constitutional: Appears well-developed and well-nourished. No distress.  HENT:  Head: Normocephalic and atraumatic.  Neck: Neck  supple. No tracheal deviation present. No thyromegaly present.  No cervical lymphadenopathy Cardiovascular: Normal rate, regular rhythm and normal heart sounds.   3/6 systolic murmur heard. No carotid bruit .  No edema Pulmonary/Chest: Effort normal and breath sounds normal. No respiratory distress. No has no wheezes. No rales.  Abdomen: soft, non tender, non distended Skin: Skin is warm and dry. Not diaphoretic.  Psychiatric: Normal mood and affect. Behavior is normal.         Assessment & Plan:   Wellness Exam: Immunizations tdap and prevnar today, discussed shingrix Colonoscopy - not indicated due to age Mammogram   - no indicated due to age Dexa -  Declined  Eye exam  Up to date  Hearing loss  - yes, wears hearing aids Memory concerns/difficulties  none Independent of ADLs partially independent - -lives with daughter   Patient received copy of preventative screening tests/immunizations recommended for the next 5-10 years.  See Problem List for Assessment and Plan of chronic medical problems.  FU annually

## 2016-07-18 NOTE — Assessment & Plan Note (Addendum)
Blood pressure slightly elevated here today Overall seems to be fairly controlled given her age Continue current medications  BP Readings from Last 3 Encounters:  07/18/16 (!) 154/70  12/29/15 (!) 144/56  11/30/15 140/88

## 2016-07-18 NOTE — Assessment & Plan Note (Signed)
Following with cardiology Rate control Asymptomatic Continue current medications

## 2016-07-18 NOTE — Assessment & Plan Note (Signed)
Euvolemic on exam CMP today Continue Lasix, potassium and labetalol

## 2016-07-19 ENCOUNTER — Telehealth: Payer: Self-pay | Admitting: Internal Medicine

## 2016-07-19 MED ORDER — METHIMAZOLE 5 MG PO TABS: ORAL_TABLET | ORAL | 3 refills | Status: DC

## 2016-07-19 NOTE — Telephone Encounter (Signed)
Sent to pharmacy 

## 2016-07-19 NOTE — Telephone Encounter (Signed)
Daughter was given test results, she would like to go ahead and put her mother on the medication.

## 2016-08-11 ENCOUNTER — Ambulatory Visit (INDEPENDENT_AMBULATORY_CARE_PROVIDER_SITE_OTHER): Payer: Medicare Other | Admitting: General Practice

## 2016-08-11 DIAGNOSIS — Z5181 Encounter for therapeutic drug level monitoring: Secondary | ICD-10-CM

## 2016-08-11 LAB — POCT INR: INR: 2.8

## 2016-08-11 NOTE — Patient Instructions (Signed)
Pre visit review using our clinic review tool, if applicable. No additional management support is needed unless otherwise documented below in the visit note. 

## 2016-09-08 ENCOUNTER — Ambulatory Visit (INDEPENDENT_AMBULATORY_CARE_PROVIDER_SITE_OTHER): Payer: Medicare Other | Admitting: General Practice

## 2016-09-08 DIAGNOSIS — Z5181 Encounter for therapeutic drug level monitoring: Secondary | ICD-10-CM

## 2016-09-08 LAB — POCT INR: INR: 2.2

## 2016-09-08 NOTE — Progress Notes (Signed)
I have reviewed and agree with the plan. 

## 2016-09-08 NOTE — Patient Instructions (Signed)
Pre visit review using our clinic review tool, if applicable. No additional management support is needed unless otherwise documented below in the visit note. 

## 2016-10-18 ENCOUNTER — Telehealth: Payer: Self-pay | Admitting: Internal Medicine

## 2016-10-18 DIAGNOSIS — H9193 Unspecified hearing loss, bilateral: Secondary | ICD-10-CM

## 2016-10-18 NOTE — Telephone Encounter (Signed)
Daughter called in and said that pt is needing a referral to [facility] so she can upgrade her hering aids  Fax number is [phone] Attn Stefannie

## 2016-10-18 NOTE — Telephone Encounter (Signed)
Ref ordered.

## 2016-10-20 ENCOUNTER — Ambulatory Visit: Payer: Medicare Other

## 2016-10-26 DIAGNOSIS — H6122 Impacted cerumen, left ear: Secondary | ICD-10-CM | POA: Diagnosis not present

## 2016-10-26 DIAGNOSIS — H903 Sensorineural hearing loss, bilateral: Secondary | ICD-10-CM | POA: Diagnosis not present

## 2016-10-27 ENCOUNTER — Ambulatory Visit (INDEPENDENT_AMBULATORY_CARE_PROVIDER_SITE_OTHER): Payer: Medicare Other | Admitting: General Practice

## 2016-10-27 ENCOUNTER — Other Ambulatory Visit: Payer: Self-pay | Admitting: Internal Medicine

## 2016-10-27 DIAGNOSIS — Z7901 Long term (current) use of anticoagulants: Secondary | ICD-10-CM | POA: Diagnosis not present

## 2016-10-27 DIAGNOSIS — Z5181 Encounter for therapeutic drug level monitoring: Secondary | ICD-10-CM

## 2016-10-27 LAB — POCT INR: INR: 2

## 2016-10-27 NOTE — Progress Notes (Signed)
I have reviewed and agree with the plan. 

## 2016-10-27 NOTE — Patient Instructions (Signed)
Pre visit review using our clinic review tool, if applicable. No additional management support is needed unless otherwise documented below in the visit note. 

## 2016-10-28 DIAGNOSIS — Z23 Encounter for immunization: Secondary | ICD-10-CM | POA: Diagnosis not present

## 2016-10-30 ENCOUNTER — Other Ambulatory Visit: Payer: Self-pay | Admitting: General Practice

## 2016-10-30 MED ORDER — WARFARIN SODIUM 1 MG PO TABS: ORAL_TABLET | ORAL | 0 refills | Status: DC

## 2016-10-30 NOTE — Telephone Encounter (Signed)
Routing to cindy boyd to handle

## 2016-11-02 ENCOUNTER — Other Ambulatory Visit: Payer: Self-pay | Admitting: Internal Medicine

## 2016-11-07 DIAGNOSIS — Z23 Encounter for immunization: Secondary | ICD-10-CM | POA: Diagnosis not present

## 2016-12-08 ENCOUNTER — Ambulatory Visit (INDEPENDENT_AMBULATORY_CARE_PROVIDER_SITE_OTHER): Payer: Medicare Other | Admitting: General Practice

## 2016-12-08 DIAGNOSIS — Z7901 Long term (current) use of anticoagulants: Secondary | ICD-10-CM

## 2016-12-08 LAB — POCT INR: INR: 1.9

## 2016-12-08 NOTE — Patient Instructions (Signed)
Pre visit review using our clinic review tool, if applicable. No additional management support is needed unless otherwise documented below in the visit note. 

## 2016-12-09 NOTE — Progress Notes (Signed)
Agree with management.  Locklan Canoy J Cookie Pore, MD  

## 2017-01-13 ENCOUNTER — Other Ambulatory Visit: Payer: Self-pay | Admitting: Cardiovascular Disease

## 2017-01-18 ENCOUNTER — Ambulatory Visit: Payer: Medicare Other | Admitting: Cardiovascular Disease

## 2017-01-19 ENCOUNTER — Ambulatory Visit: Payer: Medicare Other

## 2017-01-22 ENCOUNTER — Other Ambulatory Visit: Payer: Self-pay | Admitting: Internal Medicine

## 2017-01-26 ENCOUNTER — Ambulatory Visit (INDEPENDENT_AMBULATORY_CARE_PROVIDER_SITE_OTHER): Payer: Medicare Other | Admitting: General Practice

## 2017-01-26 DIAGNOSIS — Z7901 Long term (current) use of anticoagulants: Secondary | ICD-10-CM | POA: Diagnosis not present

## 2017-01-26 LAB — POCT INR: INR: 1.7

## 2017-01-26 NOTE — Patient Instructions (Addendum)
Pre visit review using our clinic review tool, if applicable. No additional management support is needed unless otherwise documented below in the visit note.  Take 3 tablets today (12/21) and then change dosage and take 2 tablets every day.  Re-check in 3 to 4 weeks.

## 2017-01-28 NOTE — Progress Notes (Signed)
Agree with management.  Danielle Helmkamp J Yakima Kreitzer, MD  

## 2017-02-14 DIAGNOSIS — H04123 Dry eye syndrome of bilateral lacrimal glands: Secondary | ICD-10-CM | POA: Diagnosis not present

## 2017-02-14 DIAGNOSIS — H16103 Unspecified superficial keratitis, bilateral: Secondary | ICD-10-CM | POA: Diagnosis not present

## 2017-02-14 DIAGNOSIS — Z961 Presence of intraocular lens: Secondary | ICD-10-CM | POA: Diagnosis not present

## 2017-02-14 DIAGNOSIS — H353132 Nonexudative age-related macular degeneration, bilateral, intermediate dry stage: Secondary | ICD-10-CM | POA: Diagnosis not present

## 2017-02-23 ENCOUNTER — Ambulatory Visit (INDEPENDENT_AMBULATORY_CARE_PROVIDER_SITE_OTHER): Payer: Medicare Other | Admitting: General Practice

## 2017-02-23 DIAGNOSIS — Z7901 Long term (current) use of anticoagulants: Secondary | ICD-10-CM

## 2017-02-23 DIAGNOSIS — I824Y9 Acute embolism and thrombosis of unspecified deep veins of unspecified proximal lower extremity: Secondary | ICD-10-CM | POA: Diagnosis not present

## 2017-02-23 LAB — POCT INR: INR: 2.4

## 2017-02-23 NOTE — Patient Instructions (Addendum)
Pre visit review using our clinic review tool, if applicable. No additional management support is needed unless otherwise documented below in the visit note.  Continue to take 2 tablets every day.  Re-check in 4 weeks.   

## 2017-02-23 NOTE — Progress Notes (Signed)
Agree with management.  Stacy J Burns, MD  

## 2017-03-23 ENCOUNTER — Ambulatory Visit (INDEPENDENT_AMBULATORY_CARE_PROVIDER_SITE_OTHER): Payer: Medicare Other | Admitting: General Practice

## 2017-03-23 DIAGNOSIS — I82409 Acute embolism and thrombosis of unspecified deep veins of unspecified lower extremity: Secondary | ICD-10-CM

## 2017-03-23 DIAGNOSIS — Z7901 Long term (current) use of anticoagulants: Secondary | ICD-10-CM

## 2017-03-23 LAB — POCT INR: INR: 2.3

## 2017-03-23 NOTE — Patient Instructions (Addendum)
Pre visit review using our clinic review tool, if applicable. No additional management support is needed unless otherwise documented below in the visit note.  Continue to take 2 tablets every day.  Re-check in 4 weeks.   

## 2017-03-23 NOTE — Progress Notes (Signed)
Agree with management.  Liddy Deam J Hawke Villalpando, MD  

## 2017-04-18 ENCOUNTER — Other Ambulatory Visit: Payer: Self-pay | Admitting: Internal Medicine

## 2017-04-18 ENCOUNTER — Other Ambulatory Visit: Payer: Self-pay | Admitting: General Practice

## 2017-04-18 MED ORDER — WARFARIN SODIUM 1 MG PO TABS: ORAL_TABLET | ORAL | 0 refills | Status: DC

## 2017-04-19 ENCOUNTER — Ambulatory Visit: Payer: Medicare Other | Admitting: Cardiovascular Disease

## 2017-04-20 ENCOUNTER — Ambulatory Visit (INDEPENDENT_AMBULATORY_CARE_PROVIDER_SITE_OTHER): Payer: Medicare Other | Admitting: General Practice

## 2017-04-20 DIAGNOSIS — Z7901 Long term (current) use of anticoagulants: Secondary | ICD-10-CM | POA: Diagnosis not present

## 2017-04-20 LAB — POCT INR: INR: 2.1

## 2017-04-20 NOTE — Patient Instructions (Addendum)
Pre visit review using our clinic review tool, if applicable. No additional management support is needed unless otherwise documented below in the visit note.  Continue to take 2 tablets every day.  Re-check in 6 weeks.   

## 2017-04-22 NOTE — Progress Notes (Signed)
Agree with management.  Raigan Baria J Reida Hem, MD  

## 2017-05-18 DIAGNOSIS — H6122 Impacted cerumen, left ear: Secondary | ICD-10-CM | POA: Diagnosis not present

## 2017-05-18 DIAGNOSIS — H903 Sensorineural hearing loss, bilateral: Secondary | ICD-10-CM | POA: Diagnosis not present

## 2017-06-01 ENCOUNTER — Ambulatory Visit (INDEPENDENT_AMBULATORY_CARE_PROVIDER_SITE_OTHER): Payer: Medicare Other | Admitting: General Practice

## 2017-06-01 DIAGNOSIS — Z7901 Long term (current) use of anticoagulants: Secondary | ICD-10-CM | POA: Diagnosis not present

## 2017-06-01 LAB — POCT INR: INR: 2.2

## 2017-06-01 NOTE — Patient Instructions (Addendum)
Pre visit review using our clinic review tool, if applicable. No additional management support is needed unless otherwise documented below in the visit note.  Continue to take 2 tablets every day.  Re-check in 6 weeks.   

## 2017-07-12 ENCOUNTER — Other Ambulatory Visit: Payer: Self-pay | Admitting: General Practice

## 2017-07-12 ENCOUNTER — Other Ambulatory Visit: Payer: Self-pay | Admitting: Internal Medicine

## 2017-07-12 ENCOUNTER — Other Ambulatory Visit: Payer: Self-pay | Admitting: Cardiovascular Disease

## 2017-07-12 MED ORDER — WARFARIN SODIUM 1 MG PO TABS: ORAL_TABLET | ORAL | 1 refills | Status: DC

## 2017-07-13 ENCOUNTER — Ambulatory Visit (INDEPENDENT_AMBULATORY_CARE_PROVIDER_SITE_OTHER): Payer: Medicare Other | Admitting: General Practice

## 2017-07-13 DIAGNOSIS — Z7901 Long term (current) use of anticoagulants: Secondary | ICD-10-CM | POA: Diagnosis not present

## 2017-07-13 DIAGNOSIS — I82499 Acute embolism and thrombosis of other specified deep vein of unspecified lower extremity: Secondary | ICD-10-CM

## 2017-07-13 LAB — POCT INR: INR: 3.3 — AB (ref 2.0–3.0)

## 2017-07-13 NOTE — Progress Notes (Signed)
Agree with management.  Gailyn Crook J Donnisha Besecker, MD  

## 2017-07-13 NOTE — Patient Instructions (Addendum)
Pre visit review using our clinic review tool, if applicable. No additional management support is needed unless otherwise documented below in the visit note.  Hold coumadin today and then continue to take 2 tablets every day.  Re-check in 6 weeks.

## 2017-07-20 ENCOUNTER — Other Ambulatory Visit: Payer: Self-pay | Admitting: Emergency Medicine

## 2017-07-20 MED ORDER — METHIMAZOLE 5 MG PO TABS
ORAL_TABLET | ORAL | 0 refills | Status: DC
Start: 2017-07-20 — End: 2017-08-29

## 2017-08-02 ENCOUNTER — Encounter

## 2017-08-02 ENCOUNTER — Encounter: Payer: Self-pay | Admitting: Cardiovascular Disease

## 2017-08-02 ENCOUNTER — Ambulatory Visit (INDEPENDENT_AMBULATORY_CARE_PROVIDER_SITE_OTHER): Payer: Medicare Other | Admitting: Cardiovascular Disease

## 2017-08-02 VITALS — BP 130/68 | HR 39 | Ht 68.0 in | Wt 146.0 lb

## 2017-08-02 DIAGNOSIS — I10 Essential (primary) hypertension: Secondary | ICD-10-CM

## 2017-08-02 DIAGNOSIS — I5032 Chronic diastolic (congestive) heart failure: Secondary | ICD-10-CM

## 2017-08-02 DIAGNOSIS — I481 Persistent atrial fibrillation: Secondary | ICD-10-CM

## 2017-08-02 DIAGNOSIS — I4819 Other persistent atrial fibrillation: Secondary | ICD-10-CM

## 2017-08-02 NOTE — Progress Notes (Signed)
Chief Complaint  Patient presents with  . Follow-up    Atrial fibrillation   History of Present Illness: 84 yo female with history of HTN, Hypothyroidism, paroxysmal atrial fibrillation, chronic diastolic CHF, pleural effusion here today for cardiac follow up.  [facility] with c/o dyspnea, LE edema, cough. She was found to be volume overloaded and felt to have a pneumonia. She was also found to have acute LE DVT, pleural effusion. She was diuresed with IV Lasix and treated for pneumonia. Echo with normal LV size and function, mild AS, mild AI, mild MR. She had rate controlled atrial fibrillation during the hospitalization. She was started on Cardizem for rate control of atrial fib and coumadin for anti-coagulation given atrial fib and DVT. Pulmonary followed her in the hospital for a pleural effusion and performed diagnostic thoracentesis. No chest tube was placed. I saw her February 2015 and she had recurrence of lower ext edema. Her Lasix was increased. INR has been followed in [place] primary care office. She has since had the Cardizem stopped due to bradycardia. She had also been on Labetalol most recently and tolerating this well. She lives with her daughter.   She is here today for follow up. The patient denies any chest pain, dyspnea, palpitations, lower extremity edema, orthopnea, PND, dizziness, near syncope or syncope.   Primary Care Physician: Pincus SanesBurns, Stacy J, MD   Past Medical History:  Diagnosis Date  . Allergic rhinitis, cause unspecified 01/25/2011  . Atrial fibrillation (HCC)    chronic anticoag  . Chronic diastolic CHF (congestive heart failure) (HCC)   . Familial tremor   . HTN (hypertension)   . Thyroid disease     Past Surgical History:  Procedure Laterality Date  . BUNIONECTOMY    . DILATION AND CURETTAGE OF UTERUS    . LAPAROSCOPIC OVARIAN CYSTECTOMY  04/2009  . TONSILLECTOMY      Current Outpatient Medications  Medication Sig Dispense  Refill  . Calcium Carbonate-Vitamin D (CALTRATE 600+D) 600-400 MG-UNIT per tablet Take 1 tablet by mouth 2 (two) times daily.     . furosemide (LASIX) 40 MG tablet TAKE 1 TABLET BY MOUTH DAILY ALTERNATING WITH 2 TABLETS BY MOUTH DAILY Please keep upcoming appointment for further refills 135 tablet 0  . labetalol (NORMODYNE) 200 MG tablet Take 0.5 tablets (100 mg total) by mouth 2 (two) times daily. 90 tablet 1  . methimazole (TAPAZOLE) 5 MG tablet TAKE 1 TABLET(5 MG) BY MOUTH DAILY  -- Office visit needed for further refills 90 tablet 0  . potassium chloride SA (K-DUR,KLOR-CON) 20 MEQ tablet Take 2 tablets (40 mEq total) by mouth 2 (two) times daily. 480 tablet 3  . triamcinolone (KENALOG) 0.025 % ointment Apply 1 application topically 2 (two) times daily. Do not use for more than 2 weeks at a time. 30 g 3  . warfarin (COUMADIN) 1 MG tablet Take 2 tablets daily or TAKE AS DIRECTED BY CLINIC 180 tablet 1   No current facility-administered medications for this visit.     Allergies  Allergen Reactions  . Claritin [Loratadine] Shortness Of Breath    Social History   Socioeconomic History  . Marital status: Widowed    Spouse name: Not on file  . Number of children: Not on file  . Years of education: Not on file  . Highest education level: Not on file  Occupational History  . Not on file  Social Needs  . Financial resource strain: Not on  file  . Food insecurity:    Worry: Not on file    Inability: Not on file  . Transportation needs:    Medical: Not on file    Non-medical: Not on file  Tobacco Use  . Smoking status: Never Smoker  . Smokeless tobacco: Never Used  Substance and Sexual Activity  . Alcohol use: No  . Drug use: No  . Sexual activity: Not on file  Lifestyle  . Physical activity:    Days per week: Not on file    Minutes per session: Not on file  . Stress: Not on file  Relationships  . Social connections:    Talks on phone: Not on file    Gets together: Not on  file    Attends religious service: Not on file    Active member of club or organization: Not on file    Attends meetings of clubs or organizations: Not on file    Relationship status: Not on file  . Intimate partner violence:    Fear of current or ex partner: Not on file    Emotionally abused: Not on file    Physically abused: Not on file    Forced sexual activity: Not on file  Other Topics Concern  . Not on file  Social History Narrative   [employer]. 1 year business school. Married 1948-widowed 1981. 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 Just moved to [facility] August '08-living with daughter. Terminal care-does Not want cardiac resuscitation or mechanical ventilation, artifical nutrition or hydration, renal dialysis or major heroic intervention.     Family History  Problem Relation Age of Onset  . Pancreatic cancer Mother   . Coronary artery disease Father   . Diabetes Sister   . Diabetes Brother     Review of Systems:  As stated in the HPI and otherwise negative.   BP 130/68   Pulse (!) 39   Ht 5\' 8"  (1.727 m)   Wt 146 lb (66.2 kg)   SpO2 97%   BMI 22.20 kg/m   Physical Examination:  General: Well developed, well nourished, NAD  HEENT: OP clear, mucus membranes moist  SKIN: warm, dry. No rashes. Neuro: No focal deficits  Musculoskeletal: Muscle strength 5/5 all ext  Psychiatric: Mood and affect normal  Neck: No JVD, no carotid bruits, no thyromegaly, no lymphadenopathy.  Lungs:Clear bilaterally, no wheezes, rhonci, crackles Cardiovascular: Irregular, brady. Systolic murmur noted.  Abdomen:Soft. Bowel sounds present. Non-tender.  Extremities: No lower extremity edema. Pulses are 2 + in the bilateral DP/PT.  Echo 02/26/13: Left ventricle: The cavity size was normal. Wall thickness was increased in a pattern of mild LVH. Systolic function was normal. The estimated ejection fraction was in the range of 60% to 65%. Wall  motion was normal; there were no regional wall motion abnormalities. - Aortic valve: There was mild stenosis. Mild regurgitation. Valve area: 1.06cm^2(VTI). Valve area: 0.98cm^2 (Vmax). - Mitral valve: Mild regurgitation. - Left atrium: The atrium was moderately dilated. - Right atrium: The atrium was moderately dilated. - Tricuspid valve: Moderate regurgitation. - Pulmonary arteries: PA peak pressure: 42mm Hg (S). - Pericardium, extracardiac: A trivial pericardial effusion was identified posterior to the heart.  EKG:  EKG is ordered today. The ekg ordered today demonstrates atrial fib, rate 39 bpm. IVCD. Poor R wave progression  Recent Labs: No results found for requested labs within last 8760 hours.   Lipid Panel    Component Value Date/Time   CHOL 119  03/04/2013 1639     Wt Readings from Last 3 Encounters:  08/02/17 146 lb (66.2 kg)  07/18/16 146 lb (66.2 kg)  12/29/15 145 lb 6.4 oz (66 kg)     Other studies Reviewed: Additional studies/ records that were reviewed today include: . Review of the above records demonstrates:   Assessment and Plan:   1. Atrial fibrillation, persistent:  She is in atrial fibrillation today. She is bradycardic and has been taking Labetalol 100 mg po BID. She is on coumadin. INR followed in primary care. Given her heart rate of 39 bpm, I discussed stopping her beta blocker but she does not wish to do this. She states that she feels great with no dizziness and doesn't want to change anything. I have also discussed stopping coumadin given her advanced age and fall risk but she wishes to continue coumadin to limit stroke risk.   2. Chronic diastolic CHF: Volume status is ok today. Will continue Lasix 40 mg alternating with 80 mg every other day  3. HTN: BP is controlled. No changes   4. H/o DVT: She is on chronic coumadin therapy  Current medicines are reviewed at length with the patient today.  The patient does not have concerns regarding  medicines.  The following changes have been made:  no change  Labs/ tests ordered today include:   Orders Placed This Encounter  Procedures  . EKG 12-Lead    Disposition:   FU with me in 12 months  Signed, Verne Carrow, MD 08/02/2017 3:09 PM    [facility] [address] [place], [place], [place]  [address] Phone: [phone]; Fax: [phone]

## 2017-08-02 NOTE — Patient Instructions (Signed)

## 2017-08-24 ENCOUNTER — Ambulatory Visit (INDEPENDENT_AMBULATORY_CARE_PROVIDER_SITE_OTHER): Payer: Medicare Other | Admitting: General Practice

## 2017-08-24 DIAGNOSIS — Z7901 Long term (current) use of anticoagulants: Secondary | ICD-10-CM | POA: Diagnosis not present

## 2017-08-24 LAB — POCT INR: INR: 3.6 — AB (ref 2.0–3.0)

## 2017-08-24 NOTE — Patient Instructions (Addendum)
Pre visit review using our clinic review tool, if applicable. No additional management support is needed unless otherwise documented below in the visit note.  Hold coumadin today and then take 2 tablets daily except 1 tablet on Wednesdays.  Re-check in 4 weeks.

## 2017-08-27 ENCOUNTER — Ambulatory Visit: Payer: Self-pay

## 2017-08-27 NOTE — Telephone Encounter (Signed)
Pt's daughter called on her behalf.  Requested an appt. For lower extremity swelling.  Reported has bilateral foot and ankle swelling, with left greater than right.  Questioned about pain or localized redness/ inflammation; daughter stated "other than areas of  Psoriasis, she doesn't have any redness  of the swollen ankles or feet."  Stated the pt. usually wears knee high socks, and the elastic would leave an indentation.  Stated she has, since, stopped using those socks.  Stated the swelling was worse over the weekend, but looks better today.  Stated the pt. has some shortness of breath with activity. Denied shortness of breath at rest.  Stated the pt. feels she has been sitting for long intervals, without walking, and questions if this has caused the increase in swelling?  Daughter reported that her foot and ankle swelling has been a little worse over past 2 weeks, but stated, today, it looks better than it has.  As Triage nurse was looking for an appt., daughter stated that she would like to have her checked for congestion also.  Upon further questioning, daugther stated the pt. "has mild congestion, sneezing, and an occasional cough."  Questioned about chest congestion and sneezing severity.  Stated "I just want to have her checked so she doesn't end up with pneumonia."   Care advice given per protocol.  Daughter verb. Understanding and agrees with plan.          Reason for Disposition . [1] MILD swelling of both ankles (i.e., pedal edema) AND [2] new onset or worsening  Answer Assessment - Initial Assessment Questions 1. ONSET: "When did the swelling start?" (e.g., minutes, hours, days)     Worsening over the past 2 weeks 2. LOCATION: "What part of the leg is swollen?"  "Are both legs swollen or just one leg?"     Bilateral leg swelling with left greater than right to the calf area.  3. SEVERITY: "How bad is the swelling?" (e.g., localized; mild, moderate, severe)  - Localized - small area of  swelling localized to one leg  - MILD pedal edema - swelling limited to foot and ankle, pitting edema < 1/4 inch (6 mm) deep, rest and elevation eliminate most or all swelling  - MODERATE edema - swelling of lower leg to knee, pitting edema > 1/4 inch (6 mm) deep, rest and elevation only partially reduce swelling  - SEVERE edema - swelling extends above knee, facial or hand swelling present      Mild swelling; foot and ankles with left > right 4. REDNESS: "Does the swelling look red or infected?"     Denied redness, other than areas of psoriasis  5. PAIN: "Is the swelling painful to touch?" If so, ask: "How painful is it?"   (Scale 1-10; mild, moderate or severe)     Denied pain 6. FEVER: "Do you have a fever?" If so, ask: "What is it, how was it measured, and when did it start?"      Denied fever/ chills 7. CAUSE: "What do you think is causing the leg swelling?"     Pt. feels she has been sitting a lot more; trying to walk more  8. MEDICAL HISTORY: "Do you have a history of heart failure, kidney disease, liver failure, or cancer?"     Atrial Fibrillation 9. RECURRENT SYMPTOM: "Have you had leg swelling before?" If so, ask: "When was the last time?" "What happened that time?"     This has been ongoing ; worsening over  the weekend  10. OTHER SYMPTOMS: "Do you have any other symptoms?" (e.g., chest pain, difficulty breathing)       If she walks too long, has some shortness of breath; denied heaviness in chest or heart racing  Protocols used: LEG SWELLING AND EDEMA-A-AH

## 2017-08-29 ENCOUNTER — Other Ambulatory Visit (INDEPENDENT_AMBULATORY_CARE_PROVIDER_SITE_OTHER): Payer: Medicare Other

## 2017-08-29 ENCOUNTER — Ambulatory Visit (INDEPENDENT_AMBULATORY_CARE_PROVIDER_SITE_OTHER): Payer: Medicare Other | Admitting: Family

## 2017-08-29 ENCOUNTER — Encounter: Payer: Self-pay | Admitting: Family

## 2017-08-29 VITALS — BP 126/64 | HR 52 | Temp 97.5°F | Ht 68.0 in | Wt 149.0 lb

## 2017-08-29 DIAGNOSIS — L409 Psoriasis, unspecified: Secondary | ICD-10-CM

## 2017-08-29 DIAGNOSIS — I739 Peripheral vascular disease, unspecified: Secondary | ICD-10-CM

## 2017-08-29 DIAGNOSIS — B070 Plantar wart: Secondary | ICD-10-CM | POA: Diagnosis not present

## 2017-08-29 DIAGNOSIS — R600 Localized edema: Secondary | ICD-10-CM

## 2017-08-29 DIAGNOSIS — E0590 Thyrotoxicosis, unspecified without thyrotoxic crisis or storm: Secondary | ICD-10-CM | POA: Diagnosis not present

## 2017-08-29 DIAGNOSIS — J309 Allergic rhinitis, unspecified: Secondary | ICD-10-CM | POA: Diagnosis not present

## 2017-08-29 LAB — TSH: TSH: 0.86 u[IU]/mL (ref 0.35–4.50)

## 2017-08-29 MED ORDER — TRIAMCINOLONE ACETONIDE 0.025 % EX OINT: 1.0000 "application " | TOPICAL_OINTMENT | Freq: Two times a day (BID) | CUTANEOUS | 1 refills | Status: DC

## 2017-08-29 NOTE — Progress Notes (Signed)
Danielle Adams is a 84 y.o. female with the following history as recorded in [employer]:  Patient Active Problem List   Diagnosis Date Noted  . Long term (current) use of anticoagulants 10/27/2016  . Bradycardia 11/30/2015  . Rash and nonspecific skin eruption 11/03/2015  . Change in bowel habits 08/23/2015  . Psoriasis 06/29/2015  . On supplemental oxygen therapy 06/29/2015  . Hearing loss 09/07/2014  . Sciatica 11/26/2013  . Chronic diastolic congestive heart failure (HCC) 10/01/2013  . Hemorrhoids, external 05/01/2013  . Encounter for therapeutic drug monitoring 04/25/2013  . DVT (deep venous thrombosis) (HCC) 02/28/2013  . DNR (do not resuscitate) 02/27/2013  . Allergic rhinitis, cause unspecified 01/25/2011  . VENOUS INSUFFICIENCY 06/21/2009  . Aortic valve disorders 01/12/2009  . ATRIAL FIBRILLATION, PAROXYSMAL 12/28/2008  . Essential and other specified forms of tremor 08/05/2008  . THYROID NODULE, RIGHT 12/11/2006  . Hyperthyroidism 12/11/2006  . Essential hypertension 12/11/2006    Current Outpatient Medications  Medication Sig Dispense Refill  . Calcium Carbonate-Vitamin D (CALTRATE 600+D) 600-400 MG-UNIT per tablet Take 1 tablet by mouth 2 (two) times daily.     . furosemide (LASIX) 40 MG tablet TAKE 1 TABLET BY MOUTH DAILY ALTERNATING WITH 2 TABLETS BY MOUTH DAILY Please keep upcoming appointment for further refills 135 tablet 0  . labetalol (NORMODYNE) 200 MG tablet Take 0.5 tablets (100 mg total) by mouth 2 (two) times daily. 90 tablet 1  . potassium chloride SA (K-DUR,KLOR-CON) 20 MEQ tablet Take 2 tablets (40 mEq total) by mouth 2 (two) times daily. 480 tablet 3  . warfarin (COUMADIN) 1 MG tablet Take 2 tablets daily or TAKE AS DIRECTED BY CLINIC 180 tablet 1  . triamcinolone (KENALOG) 0.025 % ointment Apply 1 application topically 2 (two) times daily. Do not use for more than 2 weeks at a time. 80 g 1   No current facility-administered medications for this visit.     Allergies: Claritin [loratadine]  Past Medical History:  Diagnosis Date  . Allergic rhinitis, cause unspecified 01/25/2011  . Atrial fibrillation (HCC)    chronic anticoag  . Chronic diastolic CHF (congestive heart failure) (HCC)   . Familial tremor   . HTN (hypertension)   . Thyroid disease     Past Surgical History:  Procedure Laterality Date  . BUNIONECTOMY    . DILATION AND CURETTAGE OF UTERUS    . LAPAROSCOPIC OVARIAN CYSTECTOMY  04/2009  . TONSILLECTOMY      Family History  Problem Relation Age of Onset  . Pancreatic cancer Mother   . Coronary artery disease Father   . Diabetes Sister   . Diabetes Brother     Social History   Tobacco Use  . Smoking status: Never Smoker  . Smokeless tobacco: Never Used  Substance Use Topics  . Alcohol use: No    Subjective:  Patient is accompanied by her daughter today; numerous concerns:  1) Increased swelling in lower extremities x weeks; noticing discoloration in the right leg- ankle/ right foot is "white" compared to skin color of lower leg; on Coumadin- at therapeutic dose; 2) Requesting to see foot specialist- patient is concerned about 2 warts on bottom of her right foot 3) Requesting updated steroid cream for treatment of her psoriasis 4) Increased congestion/ sneezing x 1 month; does not want medication; just wants to be sure that does not have pneumonia; 5) Needs to get TSH re-checked; per daughter, has not been on Methimazole "for a while." Patient prefers not to take medication  unless absolutely necessary.     Objective:  Vitals:   08/29/17 1332  BP: 126/64  Pulse: (!) 52  Temp: (!) 97.5 F (36.4 C)  TempSrc: Oral  SpO2: 96%  Weight: 149 lb 0.6 oz (67.6 kg)  Height: 5\' 8"  (1.727 m)    General: Well developed, well nourished, in no acute distress  Skin : Warm and dry. Psoriasis plaques noted on lower right thigh/ inner left thigh Head: Normocephalic and atraumatic  Eyes: Sclera and conjunctiva clear; pupils  round and reactive to light; extraocular movements intact  Ears: External normal; canals clear; tympanic membranes normal  Oropharynx: Pink, supple. No suspicious lesions  Neck: Supple without thyromegaly, adenopathy  Lungs: Respirations unlabored; clear to auscultation bilaterally without wheeze, rales, rhonchi  CVS exam:  irregularly irregular rhythm with rate 52.  Extremities:  Edema/ not pitting, + cyanosis both feet ( Right greater than left), no clubbing   Neurologic: Alert and oriented; speech intact; face symmetrical; moves all extremities well; CNII-XII intact without focal deficit  Assessment:  1. PAD (peripheral artery disease) (HCC)   2. Pedal edema   3. Plantar wart   4. Hyperthyroidism   5. Psoriasis   6. Allergic rhinitis, unspecified seasonality, unspecified trigger     Plan:  1. & 2. Update arterial studies; refer to vascular specialist;  3. Refer to podiatry as requested; 4. Update TSH today; 5. Refill on Triamcinolone ointment to use as directed for psoriasis; 6. Reassurance that do not hear pneumonia; patient defers any type of medication; follow-up as needed.  No follow-ups on file.  Orders Placed This Encounter  Procedures  . TSH    Standing Status:   Future    Number of Occurrences:   1    Standing Expiration Date:   08/29/2018  . Ambulatory referral to Vascular Surgery    Referral Priority:   Routine    Referral Type:   Surgical    Referral Reason:   Specialty Services Required    Requested Specialty:   Vascular Surgery    Number of Visits Requested:   1  . Ambulatory referral to Podiatry    Referral Priority:   Routine    Referral Type:   Consultation    Referral Reason:   Specialty Services Required    Requested Specialty:   Podiatry    Number of Visits Requested:   1    Requested Prescriptions   Signed Prescriptions Disp Refills  . triamcinolone (KENALOG) 0.025 % ointment 80 g 1    Sig: Apply 1 application topically 2 (two) times daily. Do not  use for more than 2 weeks at a time.

## 2017-09-03 ENCOUNTER — Other Ambulatory Visit: Payer: Self-pay

## 2017-09-03 DIAGNOSIS — I739 Peripheral vascular disease, unspecified: Secondary | ICD-10-CM

## 2017-09-12 ENCOUNTER — Ambulatory Visit (INDEPENDENT_AMBULATORY_CARE_PROVIDER_SITE_OTHER): Payer: Medicare Other | Admitting: Podiatry

## 2017-09-12 ENCOUNTER — Encounter: Payer: Self-pay | Admitting: Podiatry

## 2017-09-12 VITALS — BP 113/88 | HR 89 | Resp 16

## 2017-09-12 DIAGNOSIS — B351 Tinea unguium: Secondary | ICD-10-CM | POA: Diagnosis not present

## 2017-09-12 DIAGNOSIS — M79676 Pain in unspecified toe(s): Secondary | ICD-10-CM | POA: Diagnosis not present

## 2017-09-12 DIAGNOSIS — M21619 Bunion of unspecified foot: Secondary | ICD-10-CM | POA: Diagnosis not present

## 2017-09-12 DIAGNOSIS — Q828 Other specified congenital malformations of skin: Secondary | ICD-10-CM | POA: Diagnosis not present

## 2017-09-12 DIAGNOSIS — M79609 Pain in unspecified limb: Principal | ICD-10-CM

## 2017-09-12 NOTE — Progress Notes (Signed)
   Subjective:    Patient ID: Danielle Adams, female    DOB: May 13, 1916, 84 y.o.   MRN: [id number]  HPI    Review of Systems  All other systems reviewed and are negative.      Objective:   Physical Exam        Assessment & Plan:

## 2017-09-13 NOTE — Progress Notes (Signed)
Subjective:   Patient ID: Danielle KarvonenElise Adams, female   DOB: 84 y.o.   MRN: [id number]   HPI Patient presents with caregiver with elongated nailbeds 1-5 both feet that are painful and she cannot cut and lesions sub-first fifth metatarsal right that are painful and make it hard to walk on comfortably.  Patient does not smoke likes to be active   Review of Systems  All other systems reviewed and are negative.       Objective:  Physical Exam  Constitutional: She appears well-developed and well-nourished.  Cardiovascular: Intact distal pulses.  Pulmonary/Chest: Effort normal.  Musculoskeletal: Normal range of motion.  Neurological: She is alert.  Skin: Skin is warm.  Nursing note and vitals reviewed.   Neurovascular status found to be intact for her age with diminished range of motion subtalar midtarsal joint and diminished muscle strength.  Patient is found to have thickened nailbeds 1-5 both feet with elongation dystrophic changes and pain with palpation with subungual debris and is noted to have lesion sub-first fifth metatarsal right that are painful     Assessment:  Chronic nail disease with pain 1-5 both feet and lesion formation x2 right     Plan:  H&P initial evaluation done in today I debrided nailbeds 1-5 both feet with no iatrogenic bleeding and lesions sub-first fifth metatarsal right with instructions on supportive shoe gear usage

## 2017-09-18 ENCOUNTER — Ambulatory Visit (INDEPENDENT_AMBULATORY_CARE_PROVIDER_SITE_OTHER): Payer: Medicare Other | Admitting: General Practice

## 2017-09-18 DIAGNOSIS — Z7901 Long term (current) use of anticoagulants: Secondary | ICD-10-CM | POA: Diagnosis not present

## 2017-09-18 DIAGNOSIS — I824Y9 Acute embolism and thrombosis of unspecified deep veins of unspecified proximal lower extremity: Secondary | ICD-10-CM

## 2017-09-18 LAB — POCT INR: INR: 2.3 (ref 2.0–3.0)

## 2017-09-18 NOTE — Progress Notes (Signed)
Agree with management.  Danielle Adams J Deborah Dondero, MD  

## 2017-09-18 NOTE — Patient Instructions (Addendum)
Pre visit review using our clinic review tool, if applicable. No additional management support is needed unless otherwise documented below in the visit note.  Continue to take 2 tablets daily except 1 tablet on Wednesdays.  Re-check in 4 weeks.    

## 2017-10-15 DIAGNOSIS — H353132 Nonexudative age-related macular degeneration, bilateral, intermediate dry stage: Secondary | ICD-10-CM | POA: Diagnosis not present

## 2017-10-15 DIAGNOSIS — H04123 Dry eye syndrome of bilateral lacrimal glands: Secondary | ICD-10-CM | POA: Diagnosis not present

## 2017-10-15 DIAGNOSIS — H52203 Unspecified astigmatism, bilateral: Secondary | ICD-10-CM | POA: Diagnosis not present

## 2017-10-15 DIAGNOSIS — H43813 Vitreous degeneration, bilateral: Secondary | ICD-10-CM | POA: Diagnosis not present

## 2017-10-19 ENCOUNTER — Ambulatory Visit (INDEPENDENT_AMBULATORY_CARE_PROVIDER_SITE_OTHER): Payer: Medicare Other | Admitting: General Practice

## 2017-10-19 DIAGNOSIS — I825Y9 Chronic embolism and thrombosis of unspecified deep veins of unspecified proximal lower extremity: Secondary | ICD-10-CM

## 2017-10-19 DIAGNOSIS — Z7901 Long term (current) use of anticoagulants: Secondary | ICD-10-CM

## 2017-10-19 LAB — POCT INR: INR: 2.6 (ref 2.0–3.0)

## 2017-10-19 NOTE — Progress Notes (Signed)
Agree with management.  Jashae Wiggs J Izaah Westman, MD  

## 2017-10-19 NOTE — Patient Instructions (Addendum)
Pre visit review using our clinic review tool, if applicable. No additional management support is needed unless otherwise documented below in the visit note.  Continue to take 2 tablets daily except 1 tablet on Wednesdays.  Re-check in 4 weeks.    

## 2017-10-20 DIAGNOSIS — Z23 Encounter for immunization: Secondary | ICD-10-CM | POA: Diagnosis not present

## 2017-10-28 ENCOUNTER — Other Ambulatory Visit: Payer: Self-pay | Admitting: Internal Medicine

## 2017-10-28 ENCOUNTER — Other Ambulatory Visit: Payer: Self-pay | Admitting: Cardiovascular Disease

## 2017-10-30 ENCOUNTER — Other Ambulatory Visit: Payer: Self-pay

## 2017-10-30 ENCOUNTER — Ambulatory Visit (INDEPENDENT_AMBULATORY_CARE_PROVIDER_SITE_OTHER): Payer: Medicare Other | Admitting: Vascular Surgery

## 2017-10-30 ENCOUNTER — Encounter: Payer: Self-pay | Admitting: Vascular Surgery

## 2017-10-30 ENCOUNTER — Ambulatory Visit (HOSPITAL_COMMUNITY)
Admission: RE | Admit: 2017-10-30 | Discharge: 2017-10-30 | Disposition: A | Discharge status: Home / Self Care | Payer: Medicare Other | Source: Ambulatory Visit | Attending: Vascular Surgery | Admitting: Vascular Surgery

## 2017-10-30 ENCOUNTER — Encounter

## 2017-10-30 VITALS — BP 147/70 | HR 43 | Temp 98.1°F | Resp 18 | Ht 68.0 in | Wt 148.0 lb

## 2017-10-30 DIAGNOSIS — I82501 Chronic embolism and thrombosis of unspecified deep veins of right lower extremity: Secondary | ICD-10-CM | POA: Diagnosis not present

## 2017-10-30 DIAGNOSIS — I739 Peripheral vascular disease, unspecified: Secondary | ICD-10-CM | POA: Diagnosis not present

## 2017-10-30 NOTE — Progress Notes (Signed)
[facility]  Patient name: Danielle Adams MRN: [id number] DOB: 08-14-1916 Sex: female  REASON FOR CONSULT: Evaluation of swelling and color changes right lower extremity  HPI: Danielle Adams is a 84 y.o. female, who is here today for evaluation of color changes in her right lower extremity and also swelling.  There was concern regarding potential arterial insufficiency.  She is here today with 2 of her daughters.  She is alert and oriented and will celebrate her 89th birthday in January.  She did suffer a DVT in the popliteal and tibial veins in January 2015.  She was initiated on warfarin.  She is still on warfarin and I suspect this is related to her atrial fibrillation.  She is in a wheelchair today with some overall generalized weakness.  Her daughter was concerned regarding some color changes in her feet can be pale at times and also occasionally cyanotic.  She does have swelling more so in her right leg than her left leg.  No skin changes of venous hypertension.  She has no history of arterial occlusive disease.  Past Medical History:  Diagnosis Date  . Allergic rhinitis, cause unspecified 01/25/2011  . Atrial fibrillation (HCC)    chronic anticoag  . Chronic diastolic CHF (congestive heart failure) (HCC)   . DVT (deep venous thrombosis) (HCC)   . Familial tremor   . HTN (hypertension)   . Thyroid disease     Family History  Problem Relation Age of Onset  . Pancreatic cancer Mother   . Coronary artery disease Father   . Heart disease Father   . Diabetes Sister   . Diabetes Brother     SOCIAL HISTORY: Social History   Socioeconomic History  . Marital status: Widowed    Spouse name: Not on file  . Number of children: Not on file  . Years of education: Not on file  . Highest education level: Not on file  Occupational History  . Not on file  Social Needs  . Financial resource strain: Not on file  . Food  insecurity:    Worry: Not on file    Inability: Not on file  . Transportation needs:    Medical: Not on file    Non-medical: Not on file  Tobacco Use  . Smoking status: Never Smoker  . Smokeless tobacco: Never Used  Substance and Sexual Activity  . Alcohol use: No  . Drug use: No  . Sexual activity: Not on file  Lifestyle  . Physical activity:    Days per week: Not on file    Minutes per session: Not on file  . Stress: Not on file  Relationships  . Social connections:    Talks on phone: Not on file    Gets together: Not on file    Attends religious service: Not on file    Active member of club or organization: Not on file    Attends meetings of clubs or organizations: Not on file    Relationship status: Not on file  . Intimate partner violence:    Fear of current or ex partner: Not on file    Emotionally abused: Not on file    Physically abused: Not on file    Forced sexual activity: Not on file  Other Topics Concern  . Not on file  Social History Narrative   [employer]. 1 year business school. Married 1948-widowed 1981. 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 Just  moved to [facility] August '08-living with daughter. Terminal care-does Not want cardiac resuscitation or mechanical ventilation, artifical nutrition or hydration, renal dialysis or major heroic intervention.     Allergies  Allergen Reactions  . Claritin [Loratadine] Shortness Of Breath    Current Outpatient Medications  Medication Sig Dispense Refill  . Calcium Carbonate-Vitamin D (CALTRATE 600+D) 600-400 MG-UNIT per tablet Take 1 tablet by mouth 2 (two) times daily.     . furosemide (LASIX) 40 MG tablet TAKE 1 TABLET BY MOUTH EVERY DAY ALTERNATING WITH 2 TABLETS BY MOUTH EVERY DAY 135 tablet 2  . labetalol (NORMODYNE) 200 MG tablet Take 0.5 tablets (100 mg total) by mouth 2 (two) times daily. 90 tablet 1  . potassium chloride SA (K-DUR,KLOR-CON) 20 MEQ tablet Take 2  tablets (40 mEq total) by mouth 2 (two) times daily. -- Office visit needed for further refills 120 tablet 0  . triamcinolone (KENALOG) 0.025 % ointment Apply 1 application topically 2 (two) times daily. Do not use for more than 2 weeks at a time. 80 g 1  . warfarin (COUMADIN) 1 MG tablet Take 2 tablets daily or TAKE AS DIRECTED BY CLINIC 180 tablet 1   No current facility-administered medications for this visit.     REVIEW OF SYSTEMS:  [X]  denotes positive finding, [ ]  denotes negative finding Cardiac  Comments:  Chest pain or chest pressure:    Shortness of breath upon exertion:    Short of breath when lying flat:    Irregular heart rhythm: x       Vascular    Pain in calf, thigh, or hip brought on by ambulation:    Pain in feet at night that wakes you up from your sleep:     Blood clot in your veins:    Leg swelling:  x       Pulmonary    Oxygen at home: x   Productive cough:     Wheezing:         Neurologic    Sudden weakness in arms or legs:     Sudden numbness in arms or legs:     Sudden onset of difficulty speaking or slurred speech:    Temporary loss of vision in one eye:     Problems with dizziness:         Gastrointestinal    Blood in stool:     Vomited blood:         Genitourinary    Burning when urinating:     Blood in urine:        Psychiatric    Major depression:         Hematologic    Bleeding problems:    Problems with blood clotting too easily:        Skin    Rashes or ulcers:        Constitutional    Fever or chills:      PHYSICAL EXAM: Vitals:   10/30/17 1108 10/30/17 1110  BP: (!) 148/68 (!) 147/70  Pulse: (!) 43   Resp: 18   Temp: 98.1 F (36.7 C)   TempSrc: Oral   SpO2: 90%   Weight: 148 lb (67.1 kg)   Height: 5\' 8"  (1.727 m)     GENERAL: The patient is a well-nourished female, in no acute distress. The vital signs are documented above. CARDIOVASCULAR: 2+ radial and 2+ dorsalis pedis pulses bilaterally.  Pitting edema both  lower extremities right greater than left PULMONARY: There  is good air exchange  ABDOMEN: Soft and non-tender  MUSCULOSKELETAL: There are no major deformities or cyanosis. NEUROLOGIC: No focal weakness or paresthesias are detected. SKIN: There are no ulcers or rashes noted. PSYCHIATRIC: The patient has a normal affect.  DATA:  I do have a duplex for review from her 2015 DVT with right popliteal and tibial vein involvement.  He did undergo arterial studies in our office today revealing normal ankle arm index and normal waveforms in the arterial pedal vessels bilaterally  MEDICAL ISSUES: Long discussion with the patient and her daughters present.  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  This is mild.  I did explain the need for elevation which she is currently doing.  Also discussed compression.  She is a try this in the past and is been completely unsuccessful.  Unable to tolerate these with constriction around the popliteal area.  She was relieved with the discussion that there is no arterial disease or limb threatening issues.  Will see us again on an as-needed basis   Larina Earthlyodd F. Barnard Sharps, MD [facility] Office Tel [phone] Pager [phone]

## 2017-11-10 ENCOUNTER — Other Ambulatory Visit: Payer: Self-pay | Admitting: Internal Medicine

## 2017-11-11 ENCOUNTER — Other Ambulatory Visit: Payer: Self-pay | Admitting: Internal Medicine

## 2017-11-19 ENCOUNTER — Other Ambulatory Visit: Payer: Self-pay | Admitting: Internal Medicine

## 2017-11-19 NOTE — Telephone Encounter (Signed)
Pt has an appt sched for wen 11/21/17, and would like to have enough medication for 2 days.

## 2017-11-19 NOTE — Telephone Encounter (Signed)
Per office policy sent 30 day to local pharmacy until appt.../lmb  

## 2017-11-20 ENCOUNTER — Ambulatory Visit (INDEPENDENT_AMBULATORY_CARE_PROVIDER_SITE_OTHER): Payer: Medicare Other | Admitting: General Practice

## 2017-11-20 DIAGNOSIS — Z7901 Long term (current) use of anticoagulants: Secondary | ICD-10-CM

## 2017-11-20 DIAGNOSIS — I824Y9 Acute embolism and thrombosis of unspecified deep veins of unspecified proximal lower extremity: Secondary | ICD-10-CM

## 2017-11-20 LAB — POCT INR: INR: 2.4 (ref 2.0–3.0)

## 2017-11-20 NOTE — Progress Notes (Signed)
Subjective:    Patient ID: Danielle Adams, female    DOB: 12/04/1916, 84 y.o.   MRN: [id number]  HPI The patient is here for follow up.  She is here with her daughter.  Afib, Hypertension, h/o DVT: she is following with Cardiology and last saw Dr Clifton James in June.  She was bradycardic at that time.  Dr Clifton James discussed stopped warfarin due to age and stopping the metoprolol due to bradycardiac, but she wished to continue both and felt well on the medications.  She is taking her medication daily. She is compliant with a low sodium diet.  She denies chest pain, palpitations, shortness of breath and regular headaches. She is ambulating around her house.  She does not monitor her blood pressure at home.      Hyperthyroidism: Had blood work done in July after being off the medication for a while and TSH was in the normal range.     Overall she feels well and has no concerns.  Medications and allergies reviewed with patient and updated if appropriate.  Patient Active Problem List   Diagnosis Date Noted  . Long term (current) use of anticoagulants 10/27/2016  . Bradycardia 11/30/2015  . Rash and nonspecific skin eruption 11/03/2015  . Change in bowel habits 08/23/2015  . Psoriasis 06/29/2015  . On supplemental oxygen therapy 06/29/2015  . Hearing loss 09/07/2014  . Sciatica 11/26/2013  . Chronic diastolic congestive heart failure (HCC) 10/01/2013  . Hemorrhoids, external 05/01/2013  . Encounter for therapeutic drug monitoring 04/25/2013  . DVT (deep venous thrombosis) (HCC) 02/28/2013  . DNR (do not resuscitate) 02/27/2013  . Allergic rhinitis, cause unspecified 01/25/2011  . VENOUS INSUFFICIENCY 06/21/2009  . Aortic valve disorders 01/12/2009  . ATRIAL FIBRILLATION, PAROXYSMAL 12/28/2008  . Essential and other specified forms of tremor 08/05/2008  . THYROID NODULE, RIGHT 12/11/2006  . Hyperthyroidism 12/11/2006  . Essential hypertension 12/11/2006    Current Outpatient  Medications on File Prior to Visit  Medication Sig Dispense Refill  . Calcium Carbonate-Vitamin D (CALTRATE 600+D) 600-400 MG-UNIT per tablet Take 1 tablet by mouth 2 (two) times daily.     . furosemide (LASIX) 40 MG tablet TAKE 1 TABLET BY MOUTH EVERY DAY ALTERNATING WITH 2 TABLETS BY MOUTH EVERY DAY 135 tablet 2  . potassium chloride SA (K-DUR,KLOR-CON) 20 MEQ tablet Take 2 tablets (40 mEq total) by mouth 2 (two) times daily. -- Office visit needed for further refills 120 tablet 0  . triamcinolone (KENALOG) 0.025 % ointment Apply 1 application topically 2 (two) times daily. Do not use for more than 2 weeks at a time. 80 g 1  . warfarin (COUMADIN) 1 MG tablet Take 2 tablets daily or TAKE AS DIRECTED BY CLINIC 180 tablet 1  . [DISCONTINUED] diltiazem (TIAZAC) 180 MG 24 hr capsule Take 1 capsule (180 mg total) by mouth daily. 30 capsule 11   No current facility-administered medications on file prior to visit.     Past Medical History:  Diagnosis Date  . Allergic rhinitis, cause unspecified 01/25/2011  . Atrial fibrillation (HCC)    chronic anticoag  . Chronic diastolic CHF (congestive heart failure) (HCC)   . DVT (deep venous thrombosis) (HCC)   . Familial tremor   . HTN (hypertension)   . Thyroid disease     Past Surgical History:  Procedure Laterality Date  . BUNIONECTOMY    . DILATION AND CURETTAGE OF UTERUS    . LAPAROSCOPIC OVARIAN CYSTECTOMY  04/2009  . TONSILLECTOMY  Social History   Socioeconomic History  . Marital status: Widowed    Spouse name: Not on file  . Number of children: Not on file  . Years of education: Not on file  . Highest education level: Not on file  Occupational History  . Not on file  Social Needs  . Financial resource strain: Not on file  . Food insecurity:    Worry: Not on file    Inability: Not on file  . Transportation needs:    Medical: Not on file    Non-medical: Not on file  Tobacco Use  . Smoking status: Never Smoker  .  Smokeless tobacco: Never Used  Substance and Sexual Activity  . Alcohol use: No  . Drug use: No  . Sexual activity: Not on file  Lifestyle  . Physical activity:    Days per week: Not on file    Minutes per session: Not on file  . Stress: Not on file  Relationships  . Social connections:    Talks on phone: Not on file    Gets together: Not on file    Attends religious service: Not on file    Active member of club or organization: Not on file    Attends meetings of clubs or organizations: Not on file    Relationship status: Not on file  Other Topics Concern  . Not on file  Social History Narrative   [employer]. 1 year business school. Married 1948-widowed 1981. 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 Just moved to [facility] August '08-living with daughter. Terminal care-does Not want cardiac resuscitation or mechanical ventilation, artifical nutrition or hydration, renal dialysis or major heroic intervention.     Family History  Problem Relation Age of Onset  . Pancreatic cancer Mother   . Coronary artery disease Father   . Heart disease Father   . Diabetes Sister   . Diabetes Brother     Review of Systems  Constitutional: Negative for appetite change, chills and fever.  Respiratory: Negative for cough, shortness of breath and wheezing.   Cardiovascular: Positive for leg swelling (Minimal). Negative for chest pain and palpitations.  Gastrointestinal: Positive for constipation and diarrhea. Negative for abdominal pain and nausea.  Neurological: Negative for light-headedness and headaches.  Psychiatric/Behavioral: Negative for dysphoric mood. The patient is not nervous/anxious.        Objective:   Vitals:   11/21/17 1350  BP: (!) 146/72  Pulse: 66  Resp: 16  Temp: 98.3 F (36.8 C)  SpO2: 97%   BP Readings from Last 3 Encounters:  11/21/17 (!) 146/72  10/30/17 (!) 147/70  09/12/17 113/88   Wt Readings from Last 3  Encounters:  11/21/17 143 lb (64.9 kg)  10/30/17 148 lb (67.1 kg)  08/29/17 149 lb 0.6 oz (67.6 kg)   Body mass index is 21.74 kg/m.   Physical Exam    Constitutional: Appears well-developed and well-nourished. No distress.  HENT:  Head: Normocephalic and atraumatic.  Neck: Neck supple. No tracheal deviation present. No thyromegaly present.  No cervical lymphadenopathy Cardiovascular: Normal rate, regular rhythm and normal heart sounds.   3/6 systolic murmur heard. No carotid bruit .  Trace bilateral lower extremity edema Pulmonary/Chest: Effort normal and breath sounds normal. No respiratory distress. No has no wheezes. No rales.  Skin: Skin is warm and dry. Not diaphoretic.  Psychiatric: Normal mood and affect. Behavior is normal.      Assessment & Plan:    See Problem  List for Assessment and Plan of chronic medical problems.

## 2017-11-20 NOTE — Progress Notes (Signed)
Agree with management.  [name] [name], MD  

## 2017-11-20 NOTE — Patient Instructions (Addendum)
Pre visit review using our clinic review tool, if applicable. No additional management support is needed unless otherwise documented below in the visit note.  Continue to take 2 tablets daily except 1 tablet on Wednesdays.   Re-check in 6 weeks. 

## 2017-11-21 ENCOUNTER — Ambulatory Visit (INDEPENDENT_AMBULATORY_CARE_PROVIDER_SITE_OTHER): Payer: Medicare Other | Admitting: Internal Medicine

## 2017-11-21 ENCOUNTER — Encounter: Payer: Self-pay | Admitting: Internal Medicine

## 2017-11-21 VITALS — BP 146/72 | HR 66 | Temp 98.3°F | Resp 16 | Ht 68.0 in | Wt 143.0 lb

## 2017-11-21 DIAGNOSIS — Z9981 Dependence on supplemental oxygen: Secondary | ICD-10-CM | POA: Diagnosis not present

## 2017-11-21 DIAGNOSIS — I10 Essential (primary) hypertension: Secondary | ICD-10-CM | POA: Diagnosis not present

## 2017-11-21 DIAGNOSIS — I480 Paroxysmal atrial fibrillation: Secondary | ICD-10-CM

## 2017-11-21 DIAGNOSIS — E0590 Thyrotoxicosis, unspecified without thyrotoxic crisis or storm: Secondary | ICD-10-CM

## 2017-11-21 DIAGNOSIS — Z7901 Long term (current) use of anticoagulants: Secondary | ICD-10-CM | POA: Diagnosis not present

## 2017-11-21 MED ORDER — LABETALOL HCL 200 MG PO TABS: 100.0000 mg | ORAL_TABLET | Freq: Two times a day (BID) | ORAL | 1 refills | Status: DC

## 2017-11-21 NOTE — Patient Instructions (Addendum)
Medications reviewed and updated.  Changes include :   none  Your prescription(s) have been submitted to your pharmacy. Please take as directed and contact our office if you believe you are having problem(s) with the medication(s).   Please followup in one year   

## 2017-11-21 NOTE — Assessment & Plan Note (Signed)
She had blood work done in July after being off medication for a while and her TSH was normal Medication was not restarted and we will continue to monitor off medication

## 2017-11-21 NOTE — Assessment & Plan Note (Signed)
Takes warfarin daily as prescribed-Cindy monitors her level Cardiology did discuss with her possibly discontinuing given her age, but she wanted to continue it No falls

## 2017-11-21 NOTE — Assessment & Plan Note (Signed)
Uses oxygen at night only 

## 2017-11-21 NOTE — Assessment & Plan Note (Signed)
Follows with cardiology Has occasional palpitations Cardiology did discuss with her stopping the labetalol and warfarin given her age, but she wanted to continue both and feels well on both Labetalol renewed Continue warfarin

## 2017-11-23 ENCOUNTER — Ambulatory Visit: Payer: Medicare Other

## 2017-11-23 DIAGNOSIS — H6062 Unspecified chronic otitis externa, left ear: Secondary | ICD-10-CM | POA: Diagnosis not present

## 2017-11-23 DIAGNOSIS — H6122 Impacted cerumen, left ear: Secondary | ICD-10-CM | POA: Diagnosis not present

## 2017-12-01 ENCOUNTER — Other Ambulatory Visit: Payer: Self-pay | Admitting: Internal Medicine

## 2017-12-13 ENCOUNTER — Encounter: Payer: Self-pay | Admitting: Podiatry

## 2017-12-13 ENCOUNTER — Ambulatory Visit (INDEPENDENT_AMBULATORY_CARE_PROVIDER_SITE_OTHER): Payer: Medicare Other | Admitting: Podiatry

## 2017-12-13 DIAGNOSIS — B351 Tinea unguium: Secondary | ICD-10-CM | POA: Diagnosis not present

## 2017-12-13 DIAGNOSIS — I739 Peripheral vascular disease, unspecified: Secondary | ICD-10-CM

## 2017-12-13 DIAGNOSIS — L84 Corns and callosities: Secondary | ICD-10-CM

## 2017-12-13 DIAGNOSIS — M79674 Pain in right toe(s): Secondary | ICD-10-CM

## 2017-12-13 DIAGNOSIS — M79675 Pain in left toe(s): Secondary | ICD-10-CM | POA: Diagnosis not present

## 2017-12-13 NOTE — Patient Instructions (Signed)

## 2017-12-27 ENCOUNTER — Other Ambulatory Visit: Payer: Self-pay

## 2017-12-30 ENCOUNTER — Encounter: Payer: Self-pay | Admitting: Podiatry

## 2017-12-30 NOTE — Progress Notes (Signed)
Subjective: Danielle Adams is a 84 y.o. y.o. female who presents today with painful, discolored, thick toenails and painful calluses  which interfere with daily activities. Pain is aggravated when wearing enclosed shoe gear. Pain is relieved with periodic professional debridement.  Danielle Adams has dx of PAD and is followed by Cardiology.  Last ABIs, September, 2019 revealed abnormal toe brachial index. Right ABI: 1.04; Left ABI 1.07 Right TBI: 0.66; Right TBI 0.68  Pt also presents with painful callus formation noted  Objective: Vascular Examination: Capillary refill time <4 seconds x 10 digits Dorsalis pedis pulses 1/4 b/l Posterior tibial pulses 1/4 b/l No digital hair x 10 digits Skin temperature gradient WNL b/l Trace edema BLE  Dermatological Examination: Skin thin and atrophic b/l  Toenails 1-5 b/l discolored, thick, dystrophic with subungual debris and pain with palpation to nailbeds due to thickness of nails.  Hyperkeratotic lesions submet head 1, 5 right foot, dorsal left 2nd digit PIPJ  Musculoskeletal: Muscle strength 5/5 to all LE muscle groups  Neurological: Sensation intact with 10 gram monofilament.  Assessment: 1. Painful onychomycosis toenails 1-5 b/l in patient on blood thinner.  2. Hyperkeratotic lesions  submet head 1, 5 right foot, dorsal left 2nd digit PIPJ 3. PAD (abnormal toe pressures)  Plan: 1. Toenails 1-5 b/l were debrided in length and girth without iatrogenic bleeding. 2. Hyperkeratotic lesion pared  submet head 1, 5 right foot, dorsal left 2nd digit PIPJ 3. Patient to continue soft, supportive shoe gear 4. Patient to report any pedal injuries to medical professional immediately. 5. Avoid self trimming due to use of blood thinner. 6. Follow up 3 months. Patient/POA to call should there be a concern in the interim.

## 2018-01-01 ENCOUNTER — Ambulatory Visit: Payer: Medicare Other

## 2018-01-08 ENCOUNTER — Ambulatory Visit (INDEPENDENT_AMBULATORY_CARE_PROVIDER_SITE_OTHER): Payer: Medicare Other | Admitting: General Practice

## 2018-01-08 DIAGNOSIS — Z7901 Long term (current) use of anticoagulants: Secondary | ICD-10-CM

## 2018-01-08 DIAGNOSIS — I824Y9 Acute embolism and thrombosis of unspecified deep veins of unspecified proximal lower extremity: Secondary | ICD-10-CM

## 2018-01-08 LAB — POCT INR: INR: 2.6 (ref 2.0–3.0)

## 2018-01-08 NOTE — Patient Instructions (Addendum)
Pre visit review using our clinic review tool, if applicable. No additional management support is needed unless otherwise documented below in the visit note.  Continue to take 2 tablets daily except 1 tablet on Wednesdays.   Re-check in 6 weeks. 

## 2018-01-08 NOTE — Progress Notes (Signed)
Agree with management.  Jnya Brossard J Yamilett Anastos, MD  

## 2018-01-29 ENCOUNTER — Other Ambulatory Visit: Payer: Self-pay | Admitting: Internal Medicine

## 2018-02-19 ENCOUNTER — Ambulatory Visit (INDEPENDENT_AMBULATORY_CARE_PROVIDER_SITE_OTHER): Payer: Medicare Other | Admitting: General Practice

## 2018-02-19 DIAGNOSIS — Z7901 Long term (current) use of anticoagulants: Secondary | ICD-10-CM | POA: Diagnosis not present

## 2018-02-19 DIAGNOSIS — I824Y9 Acute embolism and thrombosis of unspecified deep veins of unspecified proximal lower extremity: Secondary | ICD-10-CM

## 2018-02-19 LAB — POCT INR: INR: 1.9 — AB (ref 2.0–3.0)

## 2018-02-19 NOTE — Patient Instructions (Signed)
Pre visit review using our clinic review tool, if applicable. No additional management support is needed unless otherwise documented below in the visit note.  Continue to take 2 tablets daily except 1 tablet on Wednesdays.  Re-check in 4 weeks.    

## 2018-02-19 NOTE — Progress Notes (Signed)
Agree with management.  Stacy J Burns, MD  

## 2018-03-13 ENCOUNTER — Ambulatory Visit: Payer: Medicare Other | Admitting: Podiatry

## 2018-03-19 ENCOUNTER — Ambulatory Visit (INDEPENDENT_AMBULATORY_CARE_PROVIDER_SITE_OTHER): Payer: Medicare Other | Admitting: General Practice

## 2018-03-19 DIAGNOSIS — Z7901 Long term (current) use of anticoagulants: Secondary | ICD-10-CM | POA: Diagnosis not present

## 2018-03-19 DIAGNOSIS — I824Y9 Acute embolism and thrombosis of unspecified deep veins of unspecified proximal lower extremity: Secondary | ICD-10-CM

## 2018-03-19 LAB — POCT INR: INR: 2.1 (ref 2.0–3.0)

## 2018-03-19 NOTE — Patient Instructions (Addendum)
Pre visit review using our clinic review tool, if applicable. No additional management support is needed unless otherwise documented below in the visit note.  Continue to take 2 tablets daily except 1 tablet on Wednesdays.  Re-check in 4 weeks.    

## 2018-03-19 NOTE — Progress Notes (Signed)
Agree with management.  Jabari Swoveland J Tamera Pingley, MD  

## 2018-03-23 ENCOUNTER — Other Ambulatory Visit: Payer: Self-pay | Admitting: Internal Medicine

## 2018-03-24 ENCOUNTER — Other Ambulatory Visit: Payer: Self-pay

## 2018-03-24 ENCOUNTER — Emergency Department (HOSPITAL_COMMUNITY): Payer: Medicare Other

## 2018-03-24 ENCOUNTER — Encounter (HOSPITAL_COMMUNITY): Payer: Self-pay

## 2018-03-24 ENCOUNTER — Inpatient Hospital Stay (HOSPITAL_COMMUNITY)
Admission: EM | Admit: 2018-03-24 | Discharge: 2018-03-27 | DRG: 552 | Disposition: A | Discharge status: Skilled Nursing Facility | Payer: Medicare Other | Attending: Internal Medicine | Admitting: Internal Medicine

## 2018-03-24 DIAGNOSIS — E079 Disorder of thyroid, unspecified: Secondary | ICD-10-CM | POA: Diagnosis present

## 2018-03-24 DIAGNOSIS — M25552 Pain in left hip: Secondary | ICD-10-CM | POA: Diagnosis not present

## 2018-03-24 DIAGNOSIS — R278 Other lack of coordination: Secondary | ICD-10-CM | POA: Diagnosis not present

## 2018-03-24 DIAGNOSIS — Z9981 Dependence on supplemental oxygen: Secondary | ICD-10-CM | POA: Diagnosis not present

## 2018-03-24 DIAGNOSIS — M545 Low back pain: Secondary | ICD-10-CM | POA: Diagnosis not present

## 2018-03-24 DIAGNOSIS — I4891 Unspecified atrial fibrillation: Secondary | ICD-10-CM | POA: Diagnosis present

## 2018-03-24 DIAGNOSIS — I480 Paroxysmal atrial fibrillation: Secondary | ICD-10-CM | POA: Diagnosis present

## 2018-03-24 DIAGNOSIS — Y92019 Unspecified place in single-family (private) house as the place of occurrence of the external cause: Secondary | ICD-10-CM | POA: Diagnosis not present

## 2018-03-24 DIAGNOSIS — Z800 Family history of malignant neoplasm of digestive organs: Secondary | ICD-10-CM | POA: Diagnosis not present

## 2018-03-24 DIAGNOSIS — W010XXA Fall on same level from slipping, tripping and stumbling without subsequent striking against object, initial encounter: Secondary | ICD-10-CM | POA: Diagnosis present

## 2018-03-24 DIAGNOSIS — Z8249 Family history of ischemic heart disease and other diseases of the circulatory system: Secondary | ICD-10-CM

## 2018-03-24 DIAGNOSIS — M2550 Pain in unspecified joint: Secondary | ICD-10-CM | POA: Diagnosis not present

## 2018-03-24 DIAGNOSIS — S329XXS Fracture of unspecified parts of lumbosacral spine and pelvis, sequela: Secondary | ICD-10-CM

## 2018-03-24 DIAGNOSIS — S329XXA Fracture of unspecified parts of lumbosacral spine and pelvis, initial encounter for closed fracture: Secondary | ICD-10-CM | POA: Diagnosis present

## 2018-03-24 DIAGNOSIS — M6281 Muscle weakness (generalized): Secondary | ICD-10-CM | POA: Diagnosis not present

## 2018-03-24 DIAGNOSIS — Z7901 Long term (current) use of anticoagulants: Secondary | ICD-10-CM | POA: Diagnosis not present

## 2018-03-24 DIAGNOSIS — R2689 Other abnormalities of gait and mobility: Secondary | ICD-10-CM | POA: Diagnosis not present

## 2018-03-24 DIAGNOSIS — M25562 Pain in left knee: Secondary | ICD-10-CM | POA: Diagnosis present

## 2018-03-24 DIAGNOSIS — G250 Essential tremor: Secondary | ICD-10-CM | POA: Diagnosis present

## 2018-03-24 DIAGNOSIS — Z833 Family history of diabetes mellitus: Secondary | ICD-10-CM

## 2018-03-24 DIAGNOSIS — I824Y9 Acute embolism and thrombosis of unspecified deep veins of unspecified proximal lower extremity: Secondary | ICD-10-CM | POA: Diagnosis not present

## 2018-03-24 DIAGNOSIS — Z7401 Bed confinement status: Secondary | ICD-10-CM | POA: Diagnosis not present

## 2018-03-24 DIAGNOSIS — R41841 Cognitive communication deficit: Secondary | ICD-10-CM | POA: Diagnosis not present

## 2018-03-24 DIAGNOSIS — S299XXA Unspecified injury of thorax, initial encounter: Secondary | ICD-10-CM | POA: Diagnosis not present

## 2018-03-24 DIAGNOSIS — I130 Hypertensive heart and chronic kidney disease with heart failure and stage 1 through stage 4 chronic kidney disease, or unspecified chronic kidney disease: Secondary | ICD-10-CM | POA: Diagnosis present

## 2018-03-24 DIAGNOSIS — N183 Chronic kidney disease, stage 3 (moderate): Secondary | ICD-10-CM | POA: Diagnosis present

## 2018-03-24 DIAGNOSIS — W19XXXA Unspecified fall, initial encounter: Secondary | ICD-10-CM

## 2018-03-24 DIAGNOSIS — S8992XA Unspecified injury of left lower leg, initial encounter: Secondary | ICD-10-CM | POA: Diagnosis not present

## 2018-03-24 DIAGNOSIS — M5489 Other dorsalgia: Secondary | ICD-10-CM | POA: Diagnosis not present

## 2018-03-24 DIAGNOSIS — K5900 Constipation, unspecified: Secondary | ICD-10-CM | POA: Diagnosis not present

## 2018-03-24 DIAGNOSIS — Z888 Allergy status to other drugs, medicaments and biological substances status: Secondary | ICD-10-CM

## 2018-03-24 DIAGNOSIS — Z66 Do not resuscitate: Secondary | ICD-10-CM | POA: Diagnosis present

## 2018-03-24 DIAGNOSIS — H9190 Unspecified hearing loss, unspecified ear: Secondary | ICD-10-CM | POA: Diagnosis not present

## 2018-03-24 DIAGNOSIS — S300XXA Contusion of lower back and pelvis, initial encounter: Secondary | ICD-10-CM | POA: Diagnosis present

## 2018-03-24 DIAGNOSIS — L409 Psoriasis, unspecified: Secondary | ICD-10-CM | POA: Diagnosis not present

## 2018-03-24 DIAGNOSIS — J309 Allergic rhinitis, unspecified: Secondary | ICD-10-CM | POA: Diagnosis not present

## 2018-03-24 DIAGNOSIS — S3339XA Dislocation of other parts of lumbar spine and pelvis, initial encounter: Secondary | ICD-10-CM | POA: Diagnosis present

## 2018-03-24 DIAGNOSIS — S3219XA Other fracture of sacrum, initial encounter for closed fracture: Secondary | ICD-10-CM | POA: Diagnosis not present

## 2018-03-24 DIAGNOSIS — S0990XA Unspecified injury of head, initial encounter: Secondary | ICD-10-CM | POA: Diagnosis not present

## 2018-03-24 DIAGNOSIS — Z86718 Personal history of other venous thrombosis and embolism: Secondary | ICD-10-CM | POA: Diagnosis not present

## 2018-03-24 DIAGNOSIS — I82409 Acute embolism and thrombosis of unspecified deep veins of unspecified lower extremity: Secondary | ICD-10-CM | POA: Diagnosis present

## 2018-03-24 DIAGNOSIS — N390 Urinary tract infection, site not specified: Secondary | ICD-10-CM | POA: Diagnosis not present

## 2018-03-24 DIAGNOSIS — S3210XD Unspecified fracture of sacrum, subsequent encounter for fracture with routine healing: Secondary | ICD-10-CM | POA: Diagnosis not present

## 2018-03-24 DIAGNOSIS — Z9181 History of falling: Secondary | ICD-10-CM | POA: Diagnosis not present

## 2018-03-24 DIAGNOSIS — M25561 Pain in right knee: Secondary | ICD-10-CM | POA: Diagnosis present

## 2018-03-24 DIAGNOSIS — I5032 Chronic diastolic (congestive) heart failure: Secondary | ICD-10-CM | POA: Diagnosis not present

## 2018-03-24 DIAGNOSIS — S3210XA Unspecified fracture of sacrum, initial encounter for closed fracture: Principal | ICD-10-CM | POA: Diagnosis present

## 2018-03-24 DIAGNOSIS — S8991XA Unspecified injury of right lower leg, initial encounter: Secondary | ICD-10-CM | POA: Diagnosis not present

## 2018-03-24 DIAGNOSIS — R52 Pain, unspecified: Secondary | ICD-10-CM | POA: Diagnosis not present

## 2018-03-24 DIAGNOSIS — S79912A Unspecified injury of left hip, initial encounter: Secondary | ICD-10-CM | POA: Diagnosis not present

## 2018-03-24 DIAGNOSIS — K219 Gastro-esophageal reflux disease without esophagitis: Secondary | ICD-10-CM | POA: Diagnosis not present

## 2018-03-24 LAB — BASIC METABOLIC PANEL
Anion gap: 8 (ref 5–15)
BUN: 26 mg/dL — ABNORMAL HIGH (ref 8–23)
CHLORIDE: 108 mmol/L (ref 98–111)
CO2: 25 mmol/L (ref 22–32)
Calcium: 9.5 mg/dL (ref 8.9–10.3)
Creatinine, Ser: 1.32 mg/dL — ABNORMAL HIGH (ref 0.44–1.00)
GFR calc Af Amer: 38 mL/min — ABNORMAL LOW (ref >60)
GFR calc non Af Amer: 33 mL/min — ABNORMAL LOW (ref >60)
Glucose, Bld: 113 mg/dL — ABNORMAL HIGH (ref 70–99)
Potassium: 4.3 mmol/L (ref 3.5–5.1)
SODIUM: 141 mmol/L (ref 135–145)

## 2018-03-24 LAB — CBC WITH DIFFERENTIAL/PLATELET
Abs Immature Granulocytes: 0.07 10*3/uL (ref 0.00–0.07)
BASOS ABS: 0.1 10*3/uL (ref 0.0–0.1)
Basophils Relative: 1 %
EOS PCT: 3 %
Eosinophils Absolute: 0.3 10*3/uL (ref 0.0–0.5)
HEMATOCRIT: 38.8 % (ref 36.0–46.0)
Hemoglobin: 11.9 g/dL — ABNORMAL LOW (ref 12.0–15.0)
Immature Granulocytes: 1 %
LYMPHS PCT: 17 %
Lymphs Abs: 1.7 10*3/uL (ref 0.7–4.0)
MCH: 29.3 pg (ref 26.0–34.0)
MCHC: 30.7 g/dL (ref 30.0–36.0)
MCV: 95.6 fL (ref 80.0–100.0)
Monocytes Absolute: 1.1 10*3/uL — ABNORMAL HIGH (ref 0.1–1.0)
Monocytes Relative: 11 %
Neutro Abs: 6.7 10*3/uL (ref 1.7–7.7)
Neutrophils Relative %: 67 %
Platelets: 167 10*3/uL (ref 150–400)
RBC: 4.06 MIL/uL (ref 3.87–5.11)
RDW: 13.7 % (ref 11.5–15.5)
WBC: 9.9 10*3/uL (ref 4.0–10.5)
nRBC: 0 % (ref 0.0–0.2)

## 2018-03-24 LAB — TYPE AND SCREEN
ABO/RH(D): O POS
Antibody Screen: NEGATIVE

## 2018-03-24 LAB — ABO/RH: ABO/RH(D): O POS

## 2018-03-24 LAB — PROTIME-INR
INR: 2.3
Prothrombin Time: 25 seconds — ABNORMAL HIGH (ref 11.4–15.2)

## 2018-03-24 MED ORDER — ONDANSETRON HCL 4 MG PO TABS: 4.0000 mg | ORAL_TABLET | Freq: Four times a day (QID) | ORAL | Status: DC | PRN

## 2018-03-24 MED ORDER — ACETAMINOPHEN 325 MG PO TABS
650.0000 mg | ORAL_TABLET | Freq: Four times a day (QID) | ORAL | Status: DC | PRN
  Administered 2018-03-25 – 2018-03-27 (×4): 650 mg via ORAL
  Filled 2018-03-24 (×4): qty 2

## 2018-03-24 MED ORDER — ACETAMINOPHEN 650 MG RE SUPP: 650.0000 mg | Freq: Four times a day (QID) | RECTAL | Status: DC | PRN

## 2018-03-24 MED ORDER — OXYCODONE HCL 5 MG PO TABS
5.0000 mg | ORAL_TABLET | ORAL | Status: DC | PRN
  Administered 2018-03-25 – 2018-03-27 (×9): 5 mg via ORAL
  Filled 2018-03-24 (×9): qty 1

## 2018-03-24 MED ORDER — SODIUM CHLORIDE 0.9 % IV SOLN: 250.0000 mL | INTRAVENOUS | Status: DC | PRN

## 2018-03-24 MED ORDER — FENTANYL CITRATE (PF) 100 MCG/2ML IJ SOLN
50.0000 ug | INTRAMUSCULAR | Status: DC | PRN
  Administered 2018-03-24: 50 ug via INTRAVENOUS
  Filled 2018-03-24: qty 2

## 2018-03-24 MED ORDER — ONDANSETRON HCL 4 MG/2ML IJ SOLN: 4.0000 mg | Freq: Four times a day (QID) | INTRAMUSCULAR | Status: DC | PRN

## 2018-03-24 MED ORDER — SODIUM CHLORIDE 0.9% FLUSH
3.0000 mL | Freq: Two times a day (BID) | INTRAVENOUS | Status: DC
  Administered 2018-03-24 – 2018-03-27 (×4): 3 mL via INTRAVENOUS

## 2018-03-24 MED ORDER — SODIUM CHLORIDE 0.9% FLUSH: 3.0000 mL | INTRAVENOUS | Status: DC | PRN

## 2018-03-24 MED ORDER — DOCUSATE SODIUM 100 MG PO CAPS
100.0000 mg | ORAL_CAPSULE | Freq: Two times a day (BID) | ORAL | Status: DC
  Administered 2018-03-24 – 2018-03-27 (×6): 100 mg via ORAL
  Filled 2018-03-24 (×6): qty 1

## 2018-03-24 MED ORDER — FENTANYL CITRATE (PF) 100 MCG/2ML IJ SOLN
50.0000 ug | Freq: Once | INTRAMUSCULAR | Status: AC
  Administered 2018-03-24: 25 ug via INTRAVENOUS
  Filled 2018-03-24: qty 2

## 2018-03-24 MED ORDER — FENTANYL CITRATE (PF) 100 MCG/2ML IJ SOLN
25.0000 ug | Freq: Once | INTRAMUSCULAR | Status: AC
  Administered 2018-03-24: 25 ug via INTRAVENOUS

## 2018-03-24 NOTE — ED Provider Notes (Signed)
[facility] Provider Note   CSN: [id number] Arrival date & time: 03/24/18  1625     History   Chief Complaint Chief Complaint  Patient presents with  . Hip Pain  . Fall  . Knee Pain    HPI Dannielle Karvonenlise Scarano is a [age] y.o. female.  HPI Patient presents after a fall.  Tripped and fell while using her walker.  Pain in her left hip and buttock.  Did hit her head.  On anticoagulation.  Has had some right knee pain that was there prior to the fall.  Hurts behind the knee.  Has not been able to walk since the fall.  No headache.  No confusion.  Otherwise active.  No chest or abdominal pain. Past Medical History:  Diagnosis Date  . Allergic rhinitis, cause unspecified 01/25/2011  . Atrial fibrillation (HCC)    chronic anticoag  . Chronic diastolic CHF (congestive heart failure) (HCC)   . DVT (deep venous thrombosis) (HCC)   . Familial tremor   . HTN (hypertension)   . Thyroid disease     Patient Active Problem List   Diagnosis Date Noted  . Long term (current) use of anticoagulants 10/27/2016  . Bradycardia 11/30/2015  . Change in bowel habits 08/23/2015  . Psoriasis 06/29/2015  . On supplemental oxygen therapy 06/29/2015  . Hearing loss 09/07/2014  . Sciatica 11/26/2013  . Chronic diastolic congestive heart failure (HCC) 10/01/2013  . Hemorrhoids, external 05/01/2013  . Encounter for therapeutic drug monitoring 04/25/2013  . DVT (deep venous thrombosis) (HCC) 02/28/2013  . DNR (do not resuscitate) 02/27/2013  . Allergic rhinitis, cause unspecified 01/25/2011  . VENOUS INSUFFICIENCY 06/21/2009  . Aortic valve disorders 01/12/2009  . ATRIAL FIBRILLATION, PAROXYSMAL 12/28/2008  . Essential and other specified forms of tremor 08/05/2008  . THYROID NODULE, RIGHT 12/11/2006  . Hyperthyroidism 12/11/2006  . Essential hypertension 12/11/2006    Past Surgical History:  Procedure Laterality Date  . BUNIONECTOMY    . DILATION AND CURETTAGE  OF UTERUS    . LAPAROSCOPIC OVARIAN CYSTECTOMY  04/2009  . TONSILLECTOMY       OB History    Gravida  3   Para  3   Term      Preterm      AB      Living        SAB      TAB      Ectopic      Multiple      Live Births           Obstetric Comments  NSVD         Home Medications    Prior to Admission medications   Medication Sig Start Date End Date Taking? Authorizing Provider  Calcium Carbonate-Vitamin D (CALTRATE 600+D) 600-400 MG-UNIT per tablet Take 1 tablet by mouth 2 (two) times daily.     [provider]  furosemide (LASIX) 40 MG tablet TAKE 1 TABLET BY MOUTH EVERY DAY ALTERNATING WITH 2 TABLETS BY MOUTH EVERY DAY 10/29/17   Kathleene HazelMcAlhany, Christopher D, MD  labetalol (NORMODYNE) 200 MG tablet Take 0.5 tablets (100 mg total) by mouth 2 (two) times daily. 11/21/17   Pincus SanesBurns, Stacy J, MD  potassium chloride SA (K-DUR,KLOR-CON) 20 MEQ tablet TAKE 2 TABLETS BY MOUTH TWICE DAILY 01/29/18   Pincus SanesBurns, Stacy J, MD  triamcinolone (KENALOG) 0.025 % ointment Apply 1 application topically 2 (two) times daily. Do not use for more than 2 weeks at a  time. 08/29/17   Olive Bass, FNP  warfarin (COUMADIN) 1 MG tablet Take 2 tablets daily or TAKE AS DIRECTED BY CLINIC 07/12/17   Pincus Sanes, MD  diltiazem [facility]) 180 MG 24 hr capsule Take 1 capsule (180 mg total) by mouth daily. 06/22/10 07/12/11  Norins, Rosalyn Gess, MD    Family History Family History  Problem Relation Age of Onset  . Pancreatic cancer Mother   . Coronary artery disease Father   . Heart disease Father   . Diabetes Sister   . Diabetes Brother     Social History Social History   Tobacco Use  . Smoking status: Never Smoker  . Smokeless tobacco: Never Used  Substance Use Topics  . Alcohol use: No  . Drug use: No     Allergies   Claritin [loratadine]   Review of Systems Review of Systems  Constitutional: Negative for activity change.  HENT: Negative for congestion.   Respiratory:  Negative for shortness of breath.   Cardiovascular: Negative for chest pain.  Gastrointestinal: Negative for abdominal distention.  Musculoskeletal:       Pelvis/buttock pain particular left side.  Left hip pain.  Left knee pain and right knee pain.  No neck pain.  Neurological: Negative for weakness.     Physical Exam Updated Vital Signs BP (!) 182/81   Pulse 63   Temp 97.7 F (36.5 C) (Oral)   Resp 16   SpO2 99%   Physical Exam HENT:     Head: Normocephalic.     Mouth/Throat:     Mouth: Mucous membranes are moist.  Eyes:     Pupils: Pupils are equal, round, and reactive to light.  Neck:     Musculoskeletal: Neck supple.  Cardiovascular:     Rate and Rhythm: Normal rate.  Pulmonary:     Effort: Pulmonary effort is normal.  Abdominal:     Palpations: Abdomen is soft.  Musculoskeletal:     Comments:  tender left posterior pelvis.  Tenderness left hip with decreased range of motion.  Some tenderness over left knee.  Good range of motion in right knee.  Some tenderness posteriorly on right knee.  No tenderness over ankles.  Painless range of motion.  Skin:    Capillary Refill: Capillary refill takes less than 2 seconds.     Coloration: Skin is not jaundiced.  Neurological:     Mental Status: She is alert and oriented to person, place, and time.      ED Treatments / Results  Labs (all labs ordered are listed, but only abnormal results are displayed) Labs Reviewed  BASIC METABOLIC PANEL - Abnormal; Notable for the following components:      Result Value   Glucose, Bld 113 (*)    BUN 26 (*)    Creatinine, Ser 1.32 (*)    GFR calc non Af Amer 33 (*)    GFR calc Af Amer 38 (*)    All other components within normal limits  CBC WITH DIFFERENTIAL/PLATELET - Abnormal; Notable for the following components:   Hemoglobin 11.9 (*)    Monocytes Absolute 1.1 (*)    All other components within normal limits  PROTIME-INR - Abnormal; Notable for the following components:    Prothrombin Time 25.0 (*)    All other components within normal limits  TYPE AND SCREEN  ABO/RH    EKG EKG Interpretation  Date/Time:  Sunday March 24 2018 16:27:02 EST Ventricular Rate:  62 PR Interval:  QRS Duration: 158 QT Interval:  421 QTC Calculation: 428 R Axis:   85 Text Interpretation:  Atrial fibrillation Right bundle branch block Confirmed by Benjiman Core [phone]) on 03/24/2018 5:38:49 PM   Radiology Dg Chest 1 View  Result Date: 03/24/2018 CLINICAL DATA:  Fall, hip and knee pain. EXAM: CHEST  1 VIEW COMPARISON:  Chest x-rays dated 05/01/2013 and 02/25/2013. FINDINGS: Cardiomegaly appears stable. Coarse lung markings bilaterally, similar to previous exams, suggesting some degree of chronic interstitial lung disease. No confluent opacity to suggest a developing pneumonia or pulmonary edema, although the LEFT lower lung is obscured by the heart. No pneumothorax seen. No evidence of acute osseous fracture or dislocation. IMPRESSION: Stable chest x-ray. No acute findings. Electronically Signed   By: Bary Richard M.D.   On: 03/24/2018 18:17   Ct Head Wo Contrast  Result Date: 03/24/2018 CLINICAL DATA:  Fall today, hit posterior head on floor. EXAM: CT HEAD WITHOUT CONTRAST TECHNIQUE: Contiguous axial images were obtained from the base of the skull through the vertex without intravenous contrast. COMPARISON:  Head CT dated 05/10/2014. FINDINGS: Brain: Generalized age related parenchymal volume loss with commensurate dilatation of the ventricles and sulci. Mild chronic small vessel ischemic changes within the periventricular white matter regions. No mass, hemorrhage, edema or other evidence of acute parenchymal abnormality. No extra-axial hemorrhage. Vascular: Chronic calcified atherosclerotic changes of the large vessels at the skull base. No unexpected hyperdense vessel. Skull: Normal. Negative for fracture or focal lesion. Sinuses/Orbits: No acute finding. Other: No scalp  hematoma. IMPRESSION: 1. No acute findings. No intracranial mass, hemorrhage or edema. No skull fracture. 2. Mild chronic small vessel ischemic changes in the white matter. Electronically Signed   By: Bary Richard M.D.   On: 03/24/2018 18:23   Ct Pelvis Wo Contrast  Result Date: 03/24/2018 CLINICAL DATA:  Fall, thigh pain and sacral pain. EXAM: CT PELVIS WITHOUT CONTRAST TECHNIQUE: Multidetector CT imaging of the pelvis was performed following the standard protocol without intravenous contrast. COMPARISON:  None. FINDINGS: Displaced/comminuted fracture of the LEFT sacrum, with up to 5 mm fracture diastasis (at the anterior-superior aspect of the fracture line) and possible comminution within the posterior-inferior sacrum (coronal series 8, images 45 and 46). No fracture seen within the RIGHT sacrum. No fracture appreciated elsewhere within the osseous pelvis or about either hip joint. There is also a slightly displaced fracture within the transverse process of L5 vertebral body (axial series 7, images 23 through 25). No fracture extension into the L5 vertebral body or pedicles. Fluid/hemorrhage within the LEFT hemipelvis. No dilated large or small bowel loops within the pelvis. No free intraperitoneal air appreciated. Bladder is unremarkable. IMPRESSION: 1. Displaced/comminuted fracture of the LEFT sacrum, with up to 5 mm fracture diastasis (at the anterior-superior aspect of the fracture line) and possible comminution within the posterior-inferior sacrum. 2. Slightly displaced fracture within the transverse process of L5 vertebral body. 3. Moderate amount of associated free fluid, presumably hemorrhage, along the LEFT pelvic sidewall. Would consider CT angiogram of the pelvis to exclude active hemorrhage. These results were called by telephone at the time of interpretation on 03/24/2018 at 8:07 pm to Dr. Benjiman Core , who verbally acknowledged these results. Electronically Signed   By: Bary Richard M.D.    On: 03/24/2018 20:10   Dg Knee Complete 4 Views Left  Result Date: 03/24/2018 CLINICAL DATA:  Fall, knee pain. EXAM: LEFT KNEE - COMPLETE 4+ VIEW COMPARISON:  None. FINDINGS: No fracture line or displaced fracture fragment  seen. Moderate DJD. No appreciable joint effusion and adjacent soft tissues are unremarkable. IMPRESSION: No acute findings. Moderate DJD. Electronically Signed   By: Bary RichardStan  Maynard M.D.   On: 03/24/2018 18:19   Dg Knee Complete 4 Views Right  Result Date: 03/24/2018 CLINICAL DATA:  Fall, knee pain. EXAM: RIGHT KNEE - COMPLETE 4+ VIEW COMPARISON:  None. FINDINGS: No fracture line or displaced fracture fragment seen. DJD, mild to moderate in degree. No appreciable joint effusion and adjacent soft tissues are unremarkable. IMPRESSION: Negative. Electronically Signed   By: Bary RichardStan  Maynard M.D.   On: 03/24/2018 18:20   Dg Hip Unilat With Pelvis 2-3 Views Left  Result Date: 03/24/2018 CLINICAL DATA:  Fall, LEFT thigh pain, hip pain and knee pain. EXAM: DG HIP (WITH OR WITHOUT PELVIS) 2-3V LEFT COMPARISON:  None. FINDINGS: Single view of the pelvis and two views of the LEFT hip are provided. Alignment appears normal. No fracture line or displaced fracture fragment appreciated. Portion of the sacrum is obscured by overlying bowel gas. Soft tissues about the pelvis and LEFT hip are unremarkable. IMPRESSION: Negative. Electronically Signed   By: Bary RichardStan  Maynard M.D.   On: 03/24/2018 18:19    Procedures Procedures (including critical care time)  Medications Ordered in ED Medications  fentaNYL (SUBLIMAZE) injection 50 mcg (25 mcg Intravenous Given 03/24/18 1850)  fentaNYL (SUBLIMAZE) injection 25 mcg (25 mcg Intravenous Given 03/24/18 1950)     Initial Impression / Assessment and Plan / ED Course  I have reviewed the triage vital signs and the nursing notes.  Pertinent labs & imaging results that were available during my care of the patient were reviewed by me and considered in my  medical decision making (see chart for details).     Patient with fall.  Left sacral fracture.  Also transverse process fracture.  Both her weight-bear as tolerated.  However does have some pelvic bleeding from the fracture.  On Coumadin for DVT and atrial fibrillation.  INR therapeutic.  Discussed with Dr. Charlann Boxerlin.  Pelvic fracture and transverse process fracture weight-bear as tolerated.  At this point do not think that we need CT angiography to evaluate the pelvic bleeding.  Likely is from the fracture but after discussion with Dr. Charlann Boxerlin think we can monitor hemoglobins and hold Coumadin.  Patient lives with a family member at home I think she would likely require placement with this also.  Will admit to hospitalist.  Final Clinical Impressions(s) / ED Diagnoses   Final diagnoses:  Fall, initial encounter  Closed fracture of sacrum, unspecified portion of sacrum, initial encounter [facility](HCC)    ED Discharge Orders    None       Benjiman CorePickering, Jaeleigh Monaco, MD 03/24/18 2040

## 2018-03-24 NOTE — ED Triage Notes (Signed)
To room via EMS from home.  Pt was walking using walker, tripped and fell in hallway.  C/o  Left thigh pain, left hip pain and sacral pain.  Pt did hit left side of head, no lumps or bleeding.   EMS VS BP 150/86 HR 70 RR 18  Daughter states onset yesterday pt started c/o left knee pain and  Has been using heating pad today.

## 2018-03-24 NOTE — ED Notes (Signed)
Patient transported to CT 

## 2018-03-24 NOTE — H&P (Signed)
History and Physical    Danielle Adams ZOX:[id number] DOB: 1916-03-16 DOA: 03/24/2018  PCP: Pincus SanesBurns, Stacy J, MD  Patient coming from: Home  Chief Complaint: Larey SeatFell and now in pain in pelvis  HPI: Danielle Adams is a 85 y.o. female with medical history significant of A. fib on Coumadin, history of DVT 5 years ago, chronic diastolic congestive heart failure, familiar tremor, hypertension was walking with her walker today and lost her balance and fell injuring her pelvis.  Patient lives with her daughter who takes care of her at home.  She hardly takes any medications.  She did not have loss of consciousness during the fall.  She denies any recent illnesses.  No nausea vomiting or diarrhea.  No bleeding issues.  Today on imaging shows that she is got a pelvic fracture with an associated hematoma in the pelvis.  She also has a vertebral fracture.  Orthopedic surgery was called Dr. Constance Goltzlen advised weightbearing as tolerated and to just monitor her hemoglobin.  Patient is being referred for admission for intractable pain secondary to pelvic fracture.  Review of Systems: As per HPI otherwise 10 point review of systems negative.   Past Medical History:  Diagnosis Date  . Allergic rhinitis, cause unspecified 01/25/2011  . Atrial fibrillation (HCC)    chronic anticoag  . Chronic diastolic CHF (congestive heart failure) (HCC)   . DVT (deep venous thrombosis) (HCC)   . Familial tremor   . HTN (hypertension)   . Thyroid disease     Past Surgical History:  Procedure Laterality Date  . BUNIONECTOMY    . DILATION AND CURETTAGE OF UTERUS    . LAPAROSCOPIC OVARIAN CYSTECTOMY  04/2009  . TONSILLECTOMY       reports that she has never smoked. She has never used smokeless tobacco. She reports that she does not drink alcohol or use drugs.  Allergies  Allergen Reactions  . Claritin [Loratadine] Shortness Of Breath    Family History  Problem Relation Age of Onset  . Pancreatic cancer Mother   . Coronary  artery disease Father   . Heart disease Father   . Diabetes Sister   . Diabetes Brother     Prior to Admission medications   Medication Sig Start Date End Date Taking? Authorizing Provider  Calcium Carbonate-Vitamin D (CALTRATE 600+D) 600-400 MG-UNIT per tablet Take 1 tablet by mouth 2 (two) times daily.     [provider]  furosemide (LASIX) 40 MG tablet TAKE 1 TABLET BY MOUTH EVERY DAY ALTERNATING WITH 2 TABLETS BY MOUTH EVERY DAY 10/29/17   Kathleene HazelMcAlhany, Christopher D, MD  labetalol (NORMODYNE) 200 MG tablet Take 0.5 tablets (100 mg total) by mouth 2 (two) times daily. 11/21/17   Pincus SanesBurns, Stacy J, MD  potassium chloride SA (K-DUR,KLOR-CON) 20 MEQ tablet TAKE 2 TABLETS BY MOUTH TWICE DAILY 01/29/18   Pincus SanesBurns, Stacy J, MD  triamcinolone (KENALOG) 0.025 % ointment Apply 1 application topically 2 (two) times daily. Do not use for more than 2 weeks at a time. 08/29/17   Olive BassMurray, Laura Woodruff, FNP  warfarin (COUMADIN) 1 MG tablet Take 2 tablets daily or TAKE AS DIRECTED BY CLINIC 07/12/17   Pincus SanesBurns, Stacy J, MD  diltiazem [facility](TIAZAC) 180 MG 24 hr capsule Take 1 capsule (180 mg total) by mouth daily. 06/22/10 07/12/11  Jacques NavyNorins, Michael E, MD    Physical Exam: Vitals:   03/24/18 1635  BP: (!) 182/81  Pulse: 63  Resp: 16  Temp: 97.7 F (36.5 C)  TempSrc: Oral  SpO2: 99%      Constitutional: NAD, calm, comfortable Vitals:   03/24/18 1635  BP: (!) 182/81  Pulse: 63  Resp: 16  Temp: 97.7 F (36.5 C)  TempSrc: Oral  SpO2: 99%   Eyes: PERRL, lids and conjunctivae normal ENMT: Mucous membranes are moist. Posterior pharynx clear of any exudate or lesions.Normal dentition.  Neck: normal, supple, no masses, no thyromegaly Respiratory: clear to auscultation bilaterally, no wheezing, no crackles. Normal respiratory effort. No accessory muscle use.  Cardiovascular: Regular rate and rhythm, no murmurs / rubs / gallops. No extremity edema. 2+ pedal pulses. No carotid bruits.  Abdomen: no tenderness,  no masses palpated. No hepatosplenomegaly. Bowel sounds positive.  Musculoskeletal: no clubbing / cyanosis. No joint deformity upper and lower extremities. Good ROM, no contractures. Normal muscle tone.  Skin: no rashes, lesions, ulcers. No induration Neurologic: CN 2-12 grossly intact. Sensation intact, DTR normal. Strength 5/5 in all 4.  Psychiatric: Normal judgment and insight. Alert and oriented x 3. Normal mood.    Labs on Admission: I have personally reviewed following labs and imaging studies  CBC: Recent Labs  Lab 03/24/18 1724  WBC 9.9  NEUTROABS 6.7  HGB 11.9*  HCT 38.8  MCV 95.6  PLT 167   Basic Metabolic Panel: Recent Labs  Lab 03/24/18 1724  NA 141  K 4.3  CL 108  CO2 25  GLUCOSE 113*  BUN 26*  CREATININE 1.32*  CALCIUM 9.5   GFR: CrCl cannot be calculated (Unknown ideal weight.). Liver Function Tests: No results for input(s): AST, ALT, ALKPHOS, BILITOT, PROT, ALBUMIN in the last 168 hours. No results for input(s): LIPASE, AMYLASE in the last 168 hours. No results for input(s): AMMONIA in the last 168 hours. Coagulation Profile: Recent Labs  Lab 03/19/18 03/24/18 1724  INR 2.1 2.30   Cardiac Enzymes: No results for input(s): CKTOTAL, CKMB, CKMBINDEX, TROPONINI in the last 168 hours. BNP (last 3 results) No results for input(s): PROBNP in the last 8760 hours. HbA1C: No results for input(s): HGBA1C in the last 72 hours. CBG: No results for input(s): GLUCAP in the last 168 hours. Lipid Profile: No results for input(s): CHOL, HDL, LDLCALC, TRIG, CHOLHDL, LDLDIRECT in the last 72 hours. Thyroid Function Tests: No results for input(s): TSH, T4TOTAL, FREET4, T3FREE, THYROIDAB in the last 72 hours. Anemia Panel: No results for input(s): VITAMINB12, FOLATE, FERRITIN, TIBC, IRON, RETICCTPCT in the last 72 hours. Urine analysis:    Component Value Date/Time   COLORURINE YELLOW 08/23/2015 1703   APPEARANCEUR CLEAR 08/23/2015 1703   LABSPEC 1.015  08/23/2015 1703   PHURINE 5.5 08/23/2015 1703   GLUCOSEU NEGATIVE 08/23/2015 1703   HGBUR SMALL (A) 08/23/2015 1703   BILIRUBINUR NEGATIVE 08/23/2015 1703   BILIRUBINUR neg 08/23/2015 1640   KETONESUR NEGATIVE 08/23/2015 1703   PROTEINUR neg 08/23/2015 1640   PROTEINUR 30 (A) 02/25/2013 2243   UROBILINOGEN 0.2 08/23/2015 1703   NITRITE NEGATIVE 08/23/2015 1703   LEUKOCYTESUR LARGE (A) 08/23/2015 1703   Sepsis Labs: !!!!!!!!!!!!!!!!!!!!!!!!!!!!!!!!!!!!!!!!!!!! @LABRCNTIP (procalcitonin:4,lacticidven:4) )No results found for this or any previous visit (from the past 240 hour(s)).   Radiological Exams on Admission: Dg Chest 1 View  Result Date: 03/24/2018 CLINICAL DATA:  Fall, hip and knee pain. EXAM: CHEST  1 VIEW COMPARISON:  Chest x-rays dated 05/01/2013 and 02/25/2013. FINDINGS: Cardiomegaly appears stable. Coarse lung markings bilaterally, similar to previous exams, suggesting some degree of chronic interstitial lung disease. No confluent opacity to suggest a developing pneumonia or pulmonary edema, although the LEFT  lower lung is obscured by the heart. No pneumothorax seen. No evidence of acute osseous fracture or dislocation. IMPRESSION: Stable chest x-ray. No acute findings. Electronically Signed   By: Bary Richard M.D.   On: 03/24/2018 18:17   Ct Head Wo Contrast  Result Date: 03/24/2018 CLINICAL DATA:  Fall today, hit posterior head on floor. EXAM: CT HEAD WITHOUT CONTRAST TECHNIQUE: Contiguous axial images were obtained from the base of the skull through the vertex without intravenous contrast. COMPARISON:  Head CT dated 05/10/2014. FINDINGS: Brain: Generalized age related parenchymal volume loss with commensurate dilatation of the ventricles and sulci. Mild chronic small vessel ischemic changes within the periventricular white matter regions. No mass, hemorrhage, edema or other evidence of acute parenchymal abnormality. No extra-axial hemorrhage. Vascular: Chronic calcified  atherosclerotic changes of the large vessels at the skull base. No unexpected hyperdense vessel. Skull: Normal. Negative for fracture or focal lesion. Sinuses/Orbits: No acute finding. Other: No scalp hematoma. IMPRESSION: 1. No acute findings. No intracranial mass, hemorrhage or edema. No skull fracture. 2. Mild chronic small vessel ischemic changes in the white matter. Electronically Signed   By: Bary Richard M.D.   On: 03/24/2018 18:23   Ct Pelvis Wo Contrast  Result Date: 03/24/2018 CLINICAL DATA:  Fall, thigh pain and sacral pain. EXAM: CT PELVIS WITHOUT CONTRAST TECHNIQUE: Multidetector CT imaging of the pelvis was performed following the standard protocol without intravenous contrast. COMPARISON:  None. FINDINGS: Displaced/comminuted fracture of the LEFT sacrum, with up to 5 mm fracture diastasis (at the anterior-superior aspect of the fracture line) and possible comminution within the posterior-inferior sacrum (coronal series 8, images 45 and 46). No fracture seen within the RIGHT sacrum. No fracture appreciated elsewhere within the osseous pelvis or about either hip joint. There is also a slightly displaced fracture within the transverse process of L5 vertebral body (axial series 7, images 23 through 25). No fracture extension into the L5 vertebral body or pedicles. Fluid/hemorrhage within the LEFT hemipelvis. No dilated large or small bowel loops within the pelvis. No free intraperitoneal air appreciated. Bladder is unremarkable. IMPRESSION: 1. Displaced/comminuted fracture of the LEFT sacrum, with up to 5 mm fracture diastasis (at the anterior-superior aspect of the fracture line) and possible comminution within the posterior-inferior sacrum. 2. Slightly displaced fracture within the transverse process of L5 vertebral body. 3. Moderate amount of associated free fluid, presumably hemorrhage, along the LEFT pelvic sidewall. Would consider CT angiogram of the pelvis to exclude active hemorrhage. These  results were called by telephone at the time of interpretation on 03/24/2018 at 8:07 pm to Dr. Benjiman Core , who verbally acknowledged these results. Electronically Signed   By: Bary Richard M.D.   On: 03/24/2018 20:10   Dg Knee Complete 4 Views Left  Result Date: 03/24/2018 CLINICAL DATA:  Fall, knee pain. EXAM: LEFT KNEE - COMPLETE 4+ VIEW COMPARISON:  None. FINDINGS: No fracture line or displaced fracture fragment seen. Moderate DJD. No appreciable joint effusion and adjacent soft tissues are unremarkable. IMPRESSION: No acute findings. Moderate DJD. Electronically Signed   By: Bary Richard M.D.   On: 03/24/2018 18:19   Dg Knee Complete 4 Views Right  Result Date: 03/24/2018 CLINICAL DATA:  Fall, knee pain. EXAM: RIGHT KNEE - COMPLETE 4+ VIEW COMPARISON:  None. FINDINGS: No fracture line or displaced fracture fragment seen. DJD, mild to moderate in degree. No appreciable joint effusion and adjacent soft tissues are unremarkable. IMPRESSION: Negative. Electronically Signed   By: Anne Ng.D.  On: 03/24/2018 18:20   Dg Hip Unilat With Pelvis 2-3 Views Left  Result Date: 03/24/2018 CLINICAL DATA:  Fall, LEFT thigh pain, hip pain and knee pain. EXAM: DG HIP (WITH OR WITHOUT PELVIS) 2-3V LEFT COMPARISON:  None. FINDINGS: Single view of the pelvis and two views of the LEFT hip are provided. Alignment appears normal. No fracture line or displaced fracture fragment appreciated. Portion of the sacrum is obscured by overlying bowel gas. Soft tissues about the pelvis and LEFT hip are unremarkable. IMPRESSION: Negative. Electronically Signed   By: Bary Richard M.D.   On: 03/24/2018 18:19   Old chart reviewed Case discussed with Dr. Rubin Payor in the ED   Assessment/Plan 85 year old female with mechanical fall comes in with displaced pelvic fracture and vertebral fracture with associated hemorrhage in the pelvis on Coumadin Principal Problem:   Pelvic fracture (HCC)-orthopedic surgery has  been consulted.  Advised weightbearing status.  Monitor closely with a repeat hemoglobin later tonight due to this hemorrhage.  Repeat CBC in the morning.  Hemoglobin over 11.  Hold Coumadin and all anticoagulants and antiplatelet treatments.  Start her on oxycodone along with a bowel regimen.  Also place IV fentanyl order for severe pain.  Will obtain physical therapy evaluation.  Currently hemodynamically stable.  Active Problems:   ATRIAL FIBRILLATION, PAROXYSMAL-holding Coumadin at this time    DVT (deep venous thrombosis) (HCC)-this looks like it is been 5 years I do not think she is on Coumadin for this reason.  Unfortunately she cannot be on Coumadin at this time.    Chronic diastolic congestive heart failure (HCC)-compensated.    Long term (current) use of anticoagulants-INR 2.3 today.  Will allow to trend down on its own as long as patient is stable and hemoglobin remained stable.  Repeat INR in the morning.     DVT prophylaxis: SCDs Code Status: DNR confirmed with patient and family Family Communication: Daughter Disposition Plan: 1 to 3 days Consults called: Orthopedic surgery Admission status: Admission   Avis Mcmahill A MD [facility]  If 7PM-7AM, please contact night-coverage [email or web address] Password [facility]  03/24/2018, 9:02 PM

## 2018-03-25 DIAGNOSIS — Z7901 Long term (current) use of anticoagulants: Secondary | ICD-10-CM

## 2018-03-25 DIAGNOSIS — I824Y9 Acute embolism and thrombosis of unspecified deep veins of unspecified proximal lower extremity: Secondary | ICD-10-CM

## 2018-03-25 DIAGNOSIS — I480 Paroxysmal atrial fibrillation: Secondary | ICD-10-CM

## 2018-03-25 DIAGNOSIS — I5032 Chronic diastolic (congestive) heart failure: Secondary | ICD-10-CM

## 2018-03-25 LAB — CBC
HCT: 38.2 % (ref 36.0–46.0)
Hemoglobin: 11.8 g/dL — ABNORMAL LOW (ref 12.0–15.0)
MCH: 29.4 pg (ref 26.0–34.0)
MCHC: 30.9 g/dL (ref 30.0–36.0)
MCV: 95.3 fL (ref 80.0–100.0)
Platelets: 157 10*3/uL (ref 150–400)
RBC: 4.01 MIL/uL (ref 3.87–5.11)
RDW: 13.7 % (ref 11.5–15.5)
WBC: 13.5 10*3/uL — AB (ref 4.0–10.5)
nRBC: 0 % (ref 0.0–0.2)

## 2018-03-25 LAB — BASIC METABOLIC PANEL
Anion gap: 13 (ref 5–15)
BUN: 25 mg/dL — ABNORMAL HIGH (ref 8–23)
CO2: 23 mmol/L (ref 22–32)
Calcium: 9.1 mg/dL (ref 8.9–10.3)
Chloride: 105 mmol/L (ref 98–111)
Creatinine, Ser: 1.17 mg/dL — ABNORMAL HIGH (ref 0.44–1.00)
GFR calc Af Amer: 44 mL/min — ABNORMAL LOW (ref >60)
GFR, EST NON AFRICAN AMERICAN: 38 mL/min — AB (ref >60)
GLUCOSE: 133 mg/dL — AB (ref 70–99)
Potassium: 4 mmol/L (ref 3.5–5.1)
Sodium: 141 mmol/L (ref 135–145)

## 2018-03-25 LAB — HEMOGLOBIN: Hemoglobin: 12.1 g/dL (ref 12.0–15.0)

## 2018-03-25 LAB — PROTIME-INR
INR: 2.25
Prothrombin Time: 24.5 seconds — ABNORMAL HIGH (ref 11.4–15.2)

## 2018-03-25 MED ORDER — SENNOSIDES-DOCUSATE SODIUM 8.6-50 MG PO TABS: 2.0000 | ORAL_TABLET | Freq: Every evening | ORAL | Status: DC | PRN

## 2018-03-25 MED ORDER — MORPHINE SULFATE (PF) 2 MG/ML IV SOLN: 1.0000 mg | INTRAVENOUS | Status: DC | PRN

## 2018-03-25 MED ORDER — POLYETHYLENE GLYCOL 3350 17 G PO PACK
17.0000 g | PACK | Freq: Every day | ORAL | Status: DC | PRN
  Administered 2018-03-27: 17 g via ORAL
  Filled 2018-03-25: qty 1

## 2018-03-25 NOTE — Evaluation (Signed)
Physical Therapy Evaluation Patient Details Name: Danielle Adams MRN: [id number] DOB: 1916/09/06 Today's Date: 03/25/2018   History of Present Illness  85 y.o. female with medical history significant of A. fib on Coumadin, history of DVT 5 years ago, chronic diastolic congestive heart failure, familiar tremor, hypertension was walking with her walker today and lost her balance and fell injuring her pelvis Imaging reveals pelvic and vertebral fx. Orthopedist Dr. Constance Goltz advised WBAT.   Clinical Impression  PTA pt living with daughter in home with 3 steps to enter and bedroom/bath on first floor. Pt utilized Rollator to mobilize household distances and independent in bathing and dressing, requires daughters assist for cooking, cleaning and transportation to appointments. Pt currently limited in safe mobility by increased pain with movement L LE> R LE, as well as related decrease in strength and ROM. Pt requires modAx2 for bed mobility and sit>stand transfers to [facility] for transport to recliner and BSC. PT recommending SNF level rehab before going home for improved safety and endurance in her home environment. Daughter requesting [facility] SNF if available. PT will continue to follow acutely.    Follow Up Recommendations SNF    Equipment Recommendations  Other (comment)(TBD at next venue)    Recommendations for Other Services       Precautions / Restrictions Precautions Precautions: Fall Precaution Comments: fx result of fall Restrictions Weight Bearing Restrictions: Yes RLE Weight Bearing: Weight bearing as tolerated LLE Weight Bearing: Weight bearing as tolerated      Mobility  Bed Mobility Overal bed mobility: Needs Assistance Bed Mobility: Supine to Sit     Supine to sit: Mod assist;+2 for physical assistance     General bed mobility comments: modAx2 for LE to floor, pad scoot of hips to EoB and trunk to upright  Transfers Overall transfer level: Needs assistance   Transfers:  Sit to/from Stand Sit to Stand: Mod assist;+2 physical assistance         General transfer comment: modAx2 for sit<>stand to<>from Stedy to recliner and then modAx1 for sit<>stand from recliner to [facility] to [facility] and back , vc for power up with use of UE, pt unable to off weight LE to attempt stepping          Balance Overall balance assessment: Needs assistance Sitting-balance support: Feet supported;Bilateral upper extremity supported Sitting balance-Leahy Scale: Poor Sitting balance - Comments: sits EoB with bilateral UE support   Standing balance support: Bilateral upper extremity supported;During functional activity Standing balance-Leahy Scale: Poor Standing balance comment: requires UE support on Stedy to maintain static balance                             Pertinent Vitals/Pain Pain Assessment: Faces Faces Pain Scale: Hurts whole lot Pain Location: L LE>R LE Pain Descriptors / Indicators: Grimacing;Guarding Pain Intervention(s): Limited activity within patient's tolerance;Premedicated before session;Monitored during session;Repositioned    Home Living Family/patient expects to be discharged to:: Private residence Living Arrangements: Children Available Help at Discharge: Family;Available 24 hours/day Type of Home: House Home Access: Stairs to enter Entrance Stairs-Rails: Can reach both;Right;Left Entrance Stairs-Number of Steps: 3 Home Layout: Able to live on main level with bedroom/bathroom;Multi-level Home Equipment: Grab bars - tub/shower;Shower seat;Toilet riser;Walker - 4 wheels;Walker - 2 wheels      Prior Function Level of Independence: Needs assistance   Gait / Transfers Assistance Needed: ambulates household distances with Rollator  ADL's / Homemaking Assistance Needed: sponge bathes independently, dressing, daughter does  cooking, cleaning and driving to appointments            Extremity/Trunk Assessment   Upper Extremity  Assessment Upper Extremity Assessment: Defer to OT evaluation    Lower Extremity Assessment Lower Extremity Assessment: RLE deficits/detail;LLE deficits/detail RLE: Unable to fully assess due to pain LLE Deficits / Details: Ankle ROM/strength WFL, increased pain with movement limited knee and hip movement LLE: Unable to fully assess due to pain    Cervical / Trunk Assessment Cervical / Trunk Assessment: Kyphotic  Communication   Communication: HOH  Cognition Arousal/Alertness: Awake/alert Behavior During Therapy: WFL for tasks assessed/performed Overall Cognitive Status: History of cognitive impairments - at baseline                                 General Comments: some short term memory problems       General Comments General comments (skin integrity, edema, etc.): Daughter present throughout session, provided PLOF and home set up due to pt pain and HOH, Pt prefers to lean to R to offweight L hip in standing and sitting        Assessment/Plan    PT Assessment Patient needs continued PT services  PT Problem List Decreased activity tolerance;Decreased balance;Decreased mobility;Decreased strength;Decreased range of motion;Pain       PT Treatment Interventions DME instruction;Gait training;Functional mobility training;Therapeutic activities;Therapeutic exercise;Balance training;Patient/family education    PT Goals (Current goals can be found in the Care Plan section)  Acute Rehab PT Goals Patient Stated Goal: have less pain  PT Goal Formulation: With patient/family Time For Goal Achievement: 04/08/18 Potential to Achieve Goals: Fair    Frequency Min 3X/week        Co-evaluation PT/OT/SLP Co-Evaluation/Treatment: Yes Reason for Co-Treatment: For patient/therapist safety;To address functional/ADL transfers PT goals addressed during session: Mobility/safety with mobility         AM-PAC PT "6 Clicks" Mobility  Outcome Measure Help needed turning  from your back to your side while in a flat bed without using bedrails?: A Lot Help needed moving from lying on your back to sitting on the side of a flat bed without using bedrails?: A Lot Help needed moving to and from a bed to a chair (including a wheelchair)?: Total Help needed standing up from a chair using your arms (e.g., wheelchair or bedside chair)?: A Lot Help needed to walk in hospital room?: Total Help needed climbing 3-5 steps with a railing? : Total 6 Click Score: 9    End of Session Equipment Utilized During Treatment: Gait belt Activity Tolerance: Patient limited by pain Patient left: in chair;with call bell/phone within reach;with chair alarm set;with family/visitor present Nurse Communication: Mobility status;Weight bearing status;Need for lift equipment PT Visit Diagnosis: Other abnormalities of gait and mobility (R26.89);Unsteadiness on feet (R26.81);Muscle weakness (generalized) (M62.81);History of falling (Z91.81);Difficulty in walking, not elsewhere classified (R26.2);Pain Pain - Right/Left: (bilateral L>R) Pain - part of body: Hip;Leg    Time: 4782-9562 PT Time Calculation (min) (ACUTE ONLY): 35 min   Charges:   PT Evaluation $PT Eval Moderate Complexity: 1 Mod          Makai Dumond B. Beverely Risen PT, DPT Acute Rehabilitation Services Pager [phone] Office [phone]   Elon Alas [facility] 03/25/2018, 10:53 AM

## 2018-03-25 NOTE — [place] FL2 (Signed)
[place] MEDICAID FL2 LEVEL OF CARE SCREENING TOOL     IDENTIFICATION  Patient Name: Danielle Adams Birthdate: 03/11/1916 Sex: female Admission Date (Current Location): 03/24/2018  [facility] and [place] Number:  Producer, television/film/video and Address:  [facility] [place], [address] [place], [place] [address]      Provider Number: [id number]  Attending Physician Name and Address:  Dimple Nanas, MD  Relative Name and Phone Number:  [name], Daughter, [phone] & Marcia Brash, Daughter, [phone]    Current Level of Care: Hospital Recommended Level of Care: Skilled Nursing Facility Prior Approval Number: [id number]  Date Approved/Denied: 03/04/13 PASRR Number:    Discharge Plan: SNF    Current Diagnoses: Patient Active Problem List   Diagnosis Date Noted  . Pelvic fracture (HCC) 03/24/2018  . Long term (current) use of anticoagulants 10/27/2016  . Bradycardia 11/30/2015  . Change in bowel habits 08/23/2015  . Psoriasis 06/29/2015  . On supplemental oxygen therapy 06/29/2015  . Hearing loss 09/07/2014  . Sciatica 11/26/2013  . Chronic diastolic congestive heart failure (HCC) 10/01/2013  . Hemorrhoids, external 05/01/2013  . Encounter for therapeutic drug monitoring 04/25/2013  . DVT (deep venous thrombosis) (HCC) 02/28/2013  . DNR (do not resuscitate) 02/27/2013  . Allergic rhinitis, cause unspecified 01/25/2011  . VENOUS INSUFFICIENCY 06/21/2009  . Aortic valve disorders 01/12/2009  . ATRIAL FIBRILLATION, PAROXYSMAL 12/28/2008  . Essential and other specified forms of tremor 08/05/2008  . THYROID NODULE, RIGHT 12/11/2006  . Hyperthyroidism 12/11/2006  . Essential hypertension 12/11/2006    Orientation RESPIRATION BLADDER Height & Weight     Self, Time, Place, Situation  Normal Incontinent, External catheter Weight: 139 lb 1.8 oz (63.1 kg) Height:  5\' 6"  (167.6 cm)(per daughter)  BEHAVIORAL SYMPTOMS/MOOD NEUROLOGICAL  BOWEL NUTRITION STATUS      Continent Diet(Diet 2 gram sodium, thin fluids)  AMBULATORY STATUS COMMUNICATION OF NEEDS Skin   Limited Assist Verbally Bruising(On hips )                       Personal Care Assistance Level of Assistance  Bathing, Feeding, Dressing, Total care Bathing Assistance: Limited assistance Feeding assistance: Limited assistance Dressing Assistance: Limited assistance Total Care Assistance: Limited assistance   Functional Limitations Info  Sight, Hearing, Speech Sight Info: Impaired(Wears glasses) Hearing Info: Impaired(Has hearing aids) Speech Info: Adequate    SPECIAL CARE FACTORS FREQUENCY  PT (By licensed PT), OT (By licensed OT)     PT Frequency: 5x/wk OT Frequency: 5x/wk            Contractures Contractures Info: Not present    Additional Factors Info  Code Status, Allergies Code Status Info: DNR Allergies Info:  Claritin Loratadine, Tape           Current Medications (03/25/2018):  This is the current hospital active medication list Current Facility-Administered Medications  Medication Dose Route Frequency Provider Last Rate Last Dose  . 0.9 %  sodium chloride infusion  250 mL Intravenous PRN Haydee Monica, MD      . acetaminophen (TYLENOL) tablet 650 mg  650 mg Oral Q6H PRN Haydee Monica, MD   650 mg at 03/25/18 0018   Or  . acetaminophen (TYLENOL) suppository 650 mg  650 mg Rectal Q6H PRN Haydee Monica, MD      . docusate sodium (COLACE) capsule 100 mg  100 mg Oral BID Tarry Kos A, MD   100 mg at 03/25/18 7342  .  morphine 2 MG/ML injection 1 mg  1 mg Intravenous Q4H PRN Amin, Ankit Chirag, MD      . ondansetron (ZOFRAN) tablet 4 mg  4 mg Oral Q6H PRN Haydee Monica, MD       Or  . ondansetron (ZOFRAN) injection 4 mg  4 mg Intravenous Q6H PRN Tarry Kos A, MD      . oxyCODONE (Oxy IR/ROXICODONE) immediate release tablet 5 mg  5 mg Oral Q4H PRN Haydee Monica, MD   5 mg at 03/25/18 1008  . polyethylene glycol  (MIRALAX / GLYCOLAX) packet 17 g  17 g Oral Daily PRN Amin, Ankit Chirag, MD      . senna-docusate (Senokot-S) tablet 2 tablet  2 tablet Oral QHS PRN Amin, Ankit Chirag, MD      . sodium chloride flush (NS) 0.9 % injection 3 mL  3 mL Intravenous Q12H Tarry Kos A, MD   3 mL at 03/25/18 0923  . sodium chloride flush (NS) 0.9 % injection 3 mL  3 mL Intravenous PRN Haydee Monica, MD         Discharge Medications: Please see discharge summary for a list of discharge medications.  Relevant Imaging Results:  Relevant Lab Results:   Additional Information SSN: [id number]  Nada Boozer Ayven Glasco, LCSWA

## 2018-03-25 NOTE — Evaluation (Signed)
Occupational Therapy Evaluation Patient Details Name: Danielle Adams MRN: [id number] DOB: 08/09/1916 Today's Date: 03/25/2018    History of Present Illness 85 y.o. female with medical history significant of A. fib on Coumadin, history of DVT 5 years ago, chronic diastolic congestive heart failure, familiar tremor, hypertension was walking with her walker today and lost her balance and fell injuring her pelvis Imaging reveals pelvic and vertebral fx. Orthopedist Dr. Constance Goltz advised WBAT.    Clinical Impression   PTA Pt mod I at home for ADL (sponge bathes, does own dressing) daughter performs IADL. Today Pt is total A for LB ADL, mod A +2 for bed mobility and sit<>stand transfers with [facility]. Pt was able to perform transfers to the [facility] and recliner with stedy. OT will continue to follow acutely and she will require skilled OT post-acute at the SNF level - has had good experiences with [facility] in the past.     Follow Up Recommendations  SNF;Supervision/Assistance - 24 hour(has been to [facility] before and had a good experience)    Equipment Recommendations  None recommended by OT(defer to next venue)    Recommendations for Other Services       Precautions / Restrictions Precautions Precautions: Fall Precaution Comments: fx result of fall Restrictions Weight Bearing Restrictions: Yes RLE Weight Bearing: Weight bearing as tolerated LLE Weight Bearing: Weight bearing as tolerated      Mobility Bed Mobility Overal bed mobility: Needs Assistance Bed Mobility: Supine to Sit     Supine to sit: Mod assist;+2 for physical assistance     General bed mobility comments: modAx2 for LE to floor, pad scoot of hips to EoB and trunk to upright  Transfers Overall transfer level: Needs assistance   Transfers: Sit to/from Stand Sit to Stand: Mod assist;+2 physical assistance         General transfer comment: modAx2 for sit<>stand to<>from Stedy to recliner and then modAx1 for sit<>stand  from recliner to [facility] to [facility] and back , vc for power up with use of UE, pt unable to off weight LE to attempt stepping     Balance Overall balance assessment: Needs assistance Sitting-balance support: Feet supported;Bilateral upper extremity supported Sitting balance-Leahy Scale: Poor Sitting balance - Comments: sits EoB with bilateral UE support   Standing balance support: Bilateral upper extremity supported;During functional activity Standing balance-Leahy Scale: Poor Standing balance comment: requires UE support on Stedy to maintain static balance                           ADL either performed or assessed with clinical judgement   ADL Overall ADL's : Needs assistance/impaired Eating/Feeding: Minimal assistance;With caregiver independent assisting;Sitting Eating/Feeding Details (indicate cue type and reason): daughter assisting this am with breakfast Grooming: Set up;Sitting Grooming Details (indicate cue type and reason): in recliner Upper Body Bathing: Moderate assistance   Lower Body Bathing: Total assistance   Upper Body Dressing : Set up Upper Body Dressing Details (indicate cue type and reason): to don hospital gown Lower Body Dressing: Total assistance   Toilet Transfer: Minimal assistance;+2 for physical assistance;+2 for safety/equipment(STEDY) Toilet Transfer Details (indicate cue type and reason): good use of the stedy for sit<>stand and BSC transfer Toileting- Clothing Manipulation and Hygiene: Maximal assistance;+2 for physical assistance;+2 for safety/equipment;Sit to/from stand Toileting - Clothing Manipulation Details (indicate cue type and reason): one person to assist in standing (stedy) the other performing peri care  Vision Baseline Vision/History: Wears glasses Wears Glasses: At all times Patient Visual Report: No change from baseline       Perception     Praxis      Pertinent Vitals/Pain Pain Assessment: Faces Faces Pain  Scale: Hurts whole lot Pain Location: L LE>R LE Pain Descriptors / Indicators: Grimacing;Guarding Pain Intervention(s): Limited activity within patient's tolerance;Monitored during session;Repositioned     Hand Dominance Right   Extremity/Trunk Assessment Upper Extremity Assessment Upper Extremity Assessment: Generalized weakness   Lower Extremity Assessment Lower Extremity Assessment: Defer to PT evaluation RLE: Unable to fully assess due to pain LLE Deficits / Details: Ankle ROM/strength WFL, increased pain with movement limited knee and hip movement LLE: Unable to fully assess due to pain   Cervical / Trunk Assessment Cervical / Trunk Assessment: Kyphotic   Communication Communication Communication: HOH   Cognition Arousal/Alertness: Awake/alert Behavior During Therapy: WFL for tasks assessed/performed Overall Cognitive Status: History of cognitive impairments - at baseline                                 General Comments: some short term memory problems    General Comments  Daughter present throughout session, provided PLOF and home set up due to pt pain and HOH, Pt prefers to lean to R to offweight L hip in standing and sitting    Exercises     Shoulder Instructions      Home Living Family/patient expects to be discharged to:: Private residence Living Arrangements: Children Available Help at Discharge: Family;Available 24 hours/day Type of Home: House Home Access: Stairs to enter Entergy CorporationEntrance Stairs-Number of Steps: 3 Entrance Stairs-Rails: Can reach both;Right;Left Home Layout: Able to live on main level with bedroom/bathroom;Multi-level     Bathroom Shower/Tub: Walk-in shower(but sponge bathes)   Bathroom Toilet: Standard(but uses permenant toilet riser)     Home Equipment: Grab bars - tub/shower;Shower seat;Toilet riser;Walker - 4 wheels;Walker - 2 wheels          Prior Functioning/Environment Level of Independence: Needs assistance  Gait  / Transfers Assistance Needed: ambulates household distances with Rollator ADL's / Homemaking Assistance Needed: sponge bathes independently, dressing, daughter does cooking, cleaning and driving to appointments             OT Problem List: Decreased range of motion;Decreased activity tolerance;Impaired balance (sitting and/or standing);Pain      OT Treatment/Interventions: Self-care/ADL training;DME and/or AE instruction;Therapeutic exercise;Therapeutic activities;Patient/family education;Balance training    OT Goals(Current goals can be found in the care plan section) Acute Rehab OT Goals Patient Stated Goal: have less pain  OT Goal Formulation: With patient Time For Goal Achievement: 04/08/18 Potential to Achieve Goals: Good ADL Goals Pt Will Perform Lower Body Bathing: with mod assist;sitting/lateral leans Pt Will Perform Lower Body Dressing: with mod assist;with caregiver independent in assisting;sit to/from stand Pt Will Transfer to Toilet: with min assist;stand pivot transfer;bedside commode Pt Will Perform Toileting - Clothing Manipulation and hygiene: with mod assist;sit to/from stand Additional ADL Goal #1: Pt will perform bed mobility at supervision level prior to engaging in ADL activity  OT Frequency: Min 2X/week   Barriers to D/C:    very supportive daughter       Co-evaluation PT/OT/SLP Co-Evaluation/Treatment: Yes Reason for Co-Treatment: For patient/therapist safety;To address functional/ADL transfers PT goals addressed during session: Mobility/safety with mobility;Balance;Proper use of DME OT goals addressed during session: ADL's and self-care;Proper use of Adaptive equipment and DME  AM-PAC OT "6 Clicks" Daily Activity     Outcome Measure Help from another person eating meals?: A Little Help from another person taking care of personal grooming?: A Little Help from another person toileting, which includes using toliet, bedpan, or urinal?: A Lot Help  from another person bathing (including washing, rinsing, drying)?: A Lot Help from another person to put on and taking off regular upper body clothing?: A Little Help from another person to put on and taking off regular lower body clothing?: A Lot 6 Click Score: 15   End of Session Equipment Utilized During Treatment: Gait belt;Other (comment)(Stedy) Nurse Communication: Mobility status;Need for lift equipment  Activity Tolerance: Patient tolerated treatment well Patient left: in chair;with call bell/phone within reach;with family/visitor present;with chair alarm set  OT Visit Diagnosis: Unsteadiness on feet (R26.81);Other abnormalities of gait and mobility (R26.89);History of falling (Z91.81);Muscle weakness (generalized) (M62.81);Pain Pain - Right/Left: (bilateral) Pain - part of body: Hip(pelvis)                Time: 5320-2334 OT Time Calculation (min): 43 min Charges:  OT General Charges $OT Visit: 1 Visit OT Evaluation $OT Eval Moderate Complexity: 1 Mod OT Treatments $Self Care/Home Management : 8-22 mins  Sherryl Manges OTR/L Acute Rehabilitation Services Pager: [phone] Office: [phone]  Evern Bio Clarnce Homan 03/25/2018, 12:28 PM

## 2018-03-25 NOTE — Progress Notes (Signed)
PROGRESS NOTE    Danielle Adams  UEA:[id number] DOB: [birth year] DOA: 03/24/2018 PCP: Pincus SanesBurns, Stacy J, MD   Brief Narrative:  85 year old female with history of atrial fibrillation on Coumadin, chronic diastolic congestive heart failure, familial tremor, essential hypertension who typically uses walker to get around apparently lost her balance and fell at home.  Was brought to the hospital.  Radiologic imaging showed pelvic fracture with associated hematoma.  There was also some vertebral fracture.  Orthopedic recommended weightbearing as tolerated and monitoring hemoglobin otherwise no surgical intervention.   Assessment & Plan:   Principal Problem:   Pelvic fracture (HCC) Active Problems:   ATRIAL FIBRILLATION, PAROXYSMAL   DVT (deep venous thrombosis) (HCC)   Chronic diastolic congestive heart failure (HCC)   Long term (current) use of anticoagulants   Comminuted left sacrum fracture with slightly displaced L5 vertebral body Mild to moderate amount of free fluid in pelvis concerning for hematoma - Currently patient is hemodynamically stable.  Hemoglobin is somewhat stable as well.  If necessary we will get CT angiogram of the pelvis to rule out active hemorrhage.  In the meantime closely monitor INR.  Hold patient's Coumadin. - Orthopedic recommends weightbearing as tolerated, pain control -PT/OT recommend skilled nursing facility.  Paroxysmal atrial fibrillation -Currently normal sinus rhythm.  Holding Coumadin  History of chronic diastolic congestive heart failure -Appears to be compensated.  Will closely monitor this. -Slowly resume daily Lasix when appropriate   DVT prophylaxis: SCDs Code Status: DNR Family Communication: Daughter at bedside Disposition Plan: Maintain inpatient stay to ensure patient's hemoglobin and blood pressure remained stable.  She is currently on Coumadin.  Consultants:   Curbside orthopedic  Procedures:   None  Antimicrobials:    None   Subjective: States her pain is slightly better controlled.  Daughter is at the bedside.  Denies any dizziness.  Per daughter patient lives at home with her and uses her walker to get around the house.  She is capable of doing her own ADL S.  Review of Systems Otherwise negative except as per HPI, including: General: Denies fever, chills, night sweats or unintended weight loss. Resp: Denies cough, wheezing, shortness of breath. Cardiac: Denies chest pain, palpitations, orthopnea, paroxysmal nocturnal dyspnea. GI: Denies abdominal pain, nausea, vomiting, diarrhea or constipation GU: Denies dysuria, frequency, hesitancy or incontinence MS: Denies muscle aches, joint pain or swelling Neuro: Denies headache, neurologic deficits (focal weakness, numbness, tingling), abnormal gait Psych: Denies anxiety, depression, SI/HI/AVH Skin: Denies new rashes or lesions ID: Denies sick contacts, exotic exposures, travel  Objective: Vitals:   03/24/18 2030 03/24/18 2100 03/24/18 2130 03/24/18 2156  BP: (!) 156/59 (!) 141/57  (!) 165/72  Pulse: (!) 54   67  Resp: 18 (!) 25  20  Temp:    97.6 F (36.4 C)  TempSrc:    Oral  SpO2: 96%   100%  Weight:   63.1 kg   Height:   5\' 6"  (1.676 m)     Intake/Output Summary (Last 24 hours) at 03/25/2018 1137 Last data filed at 03/25/2018 [id number] Gross per 24 hour  Intake 123 ml  Output -  Net 123 ml   Filed Weights   03/24/18 2130  Weight: 63.1 kg    Examination:  General exam: Appears calm and comfortable, elderly frail Respiratory system: Clear to auscultation. Respiratory effort normal. Cardiovascular system: S1 & S2 heard, RRR. No JVD, murmurs, rubs, gallops or clicks. No pedal edema. Gastrointestinal system: Abdomen is nondistended, soft and nontender. No organomegaly or  masses felt. Normal bowel sounds heard. Central nervous system: Alert and oriented. No focal neurological deficits. Extremities: Symmetric 4 x 5 power.  Limited range  of motion due to pelvic pain Skin: No rashes, lesions or ulcers Psychiatry: Judgement and insight appear normal. Mood & affect appropriate.  Female catheter in place   Data Reviewed:   CBC: Recent Labs  Lab 03/24/18 1724 03/25/18 0005 03/25/18 0207  WBC 9.9  --  13.5*  NEUTROABS 6.7  --   --   HGB 11.9* 12.1 11.8*  HCT 38.8  --  38.2  MCV 95.6  --  95.3  PLT 167  --  157   Basic Metabolic Panel: Recent Labs  Lab 03/24/18 1724 03/25/18 0207  NA 141 141  K 4.3 4.0  CL 108 105  CO2 25 23  GLUCOSE 113* 133*  BUN 26* 25*  CREATININE 1.32* 1.17*  CALCIUM 9.5 9.1   GFR: Estimated Creatinine Clearance: 23.9 mL/min (A) (by C-G formula based on SCr of 1.17 mg/dL (H)). Liver Function Tests: No results for input(s): AST, ALT, ALKPHOS, BILITOT, PROT, ALBUMIN in the last 168 hours. No results for input(s): LIPASE, AMYLASE in the last 168 hours. No results for input(s): AMMONIA in the last 168 hours. Coagulation Profile: Recent Labs  Lab 03/19/18 03/24/18 1724 03/25/18 0207  INR 2.1 2.30 2.25   Cardiac Enzymes: No results for input(s): CKTOTAL, CKMB, CKMBINDEX, TROPONINI in the last 168 hours. BNP (last 3 results) No results for input(s): PROBNP in the last 8760 hours. HbA1C: No results for input(s): HGBA1C in the last 72 hours. CBG: No results for input(s): GLUCAP in the last 168 hours. Lipid Profile: No results for input(s): CHOL, HDL, LDLCALC, TRIG, CHOLHDL, LDLDIRECT in the last 72 hours. Thyroid Function Tests: No results for input(s): TSH, T4TOTAL, FREET4, T3FREE, THYROIDAB in the last 72 hours. Anemia Panel: No results for input(s): VITAMINB12, FOLATE, FERRITIN, TIBC, IRON, RETICCTPCT in the last 72 hours. Sepsis Labs: No results for input(s): PROCALCITON, LATICACIDVEN in the last 168 hours.  No results found for this or any previous visit (from the past 240 hour(s)).       Radiology Studies: Dg Chest 1 View  Result Date: 03/24/2018 CLINICAL DATA:   Fall, hip and knee pain. EXAM: CHEST  1 VIEW COMPARISON:  Chest x-rays dated 05/01/2013 and 02/25/2013. FINDINGS: Cardiomegaly appears stable. Coarse lung markings bilaterally, similar to previous exams, suggesting some degree of chronic interstitial lung disease. No confluent opacity to suggest a developing pneumonia or pulmonary edema, although the LEFT lower lung is obscured by the heart. No pneumothorax seen. No evidence of acute osseous fracture or dislocation. IMPRESSION: Stable chest x-ray. No acute findings. Electronically Signed   By: Bary RichardStan  Maynard M.D.   On: 03/24/2018 18:17   Ct Head Wo Contrast  Result Date: 03/24/2018 CLINICAL DATA:  Fall today, hit posterior head on floor. EXAM: CT HEAD WITHOUT CONTRAST TECHNIQUE: Contiguous axial images were obtained from the base of the skull through the vertex without intravenous contrast. COMPARISON:  Head CT dated 05/10/2014. FINDINGS: Brain: Generalized age related parenchymal volume loss with commensurate dilatation of the ventricles and sulci. Mild chronic small vessel ischemic changes within the periventricular white matter regions. No mass, hemorrhage, edema or other evidence of acute parenchymal abnormality. No extra-axial hemorrhage. Vascular: Chronic calcified atherosclerotic changes of the large vessels at the skull base. No unexpected hyperdense vessel. Skull: Normal. Negative for fracture or focal lesion. Sinuses/Orbits: No acute finding. Other: No scalp hematoma. IMPRESSION: 1.  No acute findings. No intracranial mass, hemorrhage or edema. No skull fracture. 2. Mild chronic small vessel ischemic changes in the white matter. Electronically Signed   By: Bary Richard M.D.   On: 03/24/2018 18:23   Ct Pelvis Wo Contrast  Result Date: 03/24/2018 CLINICAL DATA:  Fall, thigh pain and sacral pain. EXAM: CT PELVIS WITHOUT CONTRAST TECHNIQUE: Multidetector CT imaging of the pelvis was performed following the standard protocol without intravenous contrast.  COMPARISON:  None. FINDINGS: Displaced/comminuted fracture of the LEFT sacrum, with up to 5 mm fracture diastasis (at the anterior-superior aspect of the fracture line) and possible comminution within the posterior-inferior sacrum (coronal series 8, images 45 and 46). No fracture seen within the RIGHT sacrum. No fracture appreciated elsewhere within the osseous pelvis or about either hip joint. There is also a slightly displaced fracture within the transverse process of L5 vertebral body (axial series 7, images 23 through 25). No fracture extension into the L5 vertebral body or pedicles. Fluid/hemorrhage within the LEFT hemipelvis. No dilated large or small bowel loops within the pelvis. No free intraperitoneal air appreciated. Bladder is unremarkable. IMPRESSION: 1. Displaced/comminuted fracture of the LEFT sacrum, with up to 5 mm fracture diastasis (at the anterior-superior aspect of the fracture line) and possible comminution within the posterior-inferior sacrum. 2. Slightly displaced fracture within the transverse process of L5 vertebral body. 3. Moderate amount of associated free fluid, presumably hemorrhage, along the LEFT pelvic sidewall. Would consider CT angiogram of the pelvis to exclude active hemorrhage. These results were called by telephone at the time of interpretation on 03/24/2018 at 8:07 pm to Dr. Benjiman Core , who verbally acknowledged these results. Electronically Signed   By: Bary Richard M.D.   On: 03/24/2018 20:10   Dg Knee Complete 4 Views Left  Result Date: 03/24/2018 CLINICAL DATA:  Fall, knee pain. EXAM: LEFT KNEE - COMPLETE 4+ VIEW COMPARISON:  None. FINDINGS: No fracture line or displaced fracture fragment seen. Moderate DJD. No appreciable joint effusion and adjacent soft tissues are unremarkable. IMPRESSION: No acute findings. Moderate DJD. Electronically Signed   By: Bary Richard M.D.   On: 03/24/2018 18:19   Dg Knee Complete 4 Views Right  Result Date:  03/24/2018 CLINICAL DATA:  Fall, knee pain. EXAM: RIGHT KNEE - COMPLETE 4+ VIEW COMPARISON:  None. FINDINGS: No fracture line or displaced fracture fragment seen. DJD, mild to moderate in degree. No appreciable joint effusion and adjacent soft tissues are unremarkable. IMPRESSION: Negative. Electronically Signed   By: Bary Richard M.D.   On: 03/24/2018 18:20   Dg Hip Unilat With Pelvis 2-3 Views Left  Result Date: 03/24/2018 CLINICAL DATA:  Fall, LEFT thigh pain, hip pain and knee pain. EXAM: DG HIP (WITH OR WITHOUT PELVIS) 2-3V LEFT COMPARISON:  None. FINDINGS: Single view of the pelvis and two views of the LEFT hip are provided. Alignment appears normal. No fracture line or displaced fracture fragment appreciated. Portion of the sacrum is obscured by overlying bowel gas. Soft tissues about the pelvis and LEFT hip are unremarkable. IMPRESSION: Negative. Electronically Signed   By: Bary Richard M.D.   On: 03/24/2018 18:19        Scheduled Meds: . docusate sodium  100 mg Oral BID  . sodium chloride flush  3 mL Intravenous Q12H   Continuous Infusions: . sodium chloride       LOS: 1 day   Time spent= 35 mins    Juanluis Guastella Joline Maxcy, MD [facility]  If 7PM-7AM, please  contact night-coverage [email or web address] 03/25/2018, 11:37 AM

## 2018-03-25 NOTE — Plan of Care (Signed)
  Problem: Pain Managment: Goal: General experience of comfort will improve Outcome: Progressing   Problem: Safety: Goal: Ability to remain free from injury will improve Outcome: Progressing   Problem: Skin Integrity: Goal: Risk for impaired skin integrity will decrease Outcome: Progressing   

## 2018-03-25 NOTE — Clinical Social Work Note (Signed)
Clinical Social Work Assessment  Patient Details  Name: Danielle Adams MRN: [id number] Date of Birth: 21-Oct-1916  Date of referral:  03/25/18               Reason for consult:  Discharge Planning                Permission sought to share information with:  Case Manager Permission granted to share information::  Yes, Verbal Permission Granted  Name::     Aram Beecham and Claris Che  Agency::  SNF  Relationship::  Daughters  Contact Information:  [phone]  Housing/Transportation Living arrangements for the past 2 months:  Single Family Home Source of Information:  Patient, Adult Children Patient Interpreter Needed:  None Criminal Activity/Legal Involvement Pertinent to Current Situation/Hospitalization:  No - Comment as needed Significant Relationships:  Adult Children Lives with:  Adult Children Do you feel safe going back to the place where you live?  Yes Need for family participation in patient care:  Yes (Comment)(Daughter is POA)  Care giving concerns:    Patient has been living with her daughter prior to falling and being admitted into the hospital. Patient can do for herself but needs additional support. Patient can get confused at times.    Social Worker assessment / plan:    CSW talked with the patient and patient's daughter. The patient shared with the doctor this morning that she was agreeable to going to a skilled nursing facility. The patient has been to [facility] in the past. CSW obtained permission to send the patient out to [place]. Family is aware of the skilled nursing process and did not have any other questions or concerns.   Employment status:  Retired Health and safety inspector:  Medicare PT Recommendations:  Skilled Nursing Facility Information / Referral to community resources:  Skilled Nursing Facility  Patient/Family's Response to care:  Patient and family are aware of current medical condition and wants the patient to regain her strength.    Patient/Family's Understanding of and Emotional Response to Diagnosis, Current Treatment, and Prognosis: Patient is very supportive and has family that is able to help her.   Emotional Assessment Appearance:  Appears stated age Attitude/Demeanor/Rapport:  Unable to Assess Affect (typically observed):  Unable to Assess Orientation:  Oriented to Self, Oriented to Place, Oriented to  Time, Oriented to Situation Alcohol / Substance use:  Not Applicable Psych involvement (Current and /or in the community):  No (Comment)  Discharge Needs  Concerns to be addressed:  Discharge Planning Concerns Readmission within the last 30 days:  No Current discharge risk:  Dependent with Mobility Barriers to Discharge:  Continued Medical Work up, [employer] Walsie Smeltz, LCSWA 03/25/2018, 2:13 PM

## 2018-03-26 LAB — COMPREHENSIVE METABOLIC PANEL
ALK PHOS: 52 U/L (ref 38–126)
ALT: 12 U/L (ref 0–44)
AST: 20 U/L (ref 15–41)
Albumin: 2.9 g/dL — ABNORMAL LOW (ref 3.5–5.0)
Anion gap: 10 (ref 5–15)
BUN: 26 mg/dL — ABNORMAL HIGH (ref 8–23)
CHLORIDE: 105 mmol/L (ref 98–111)
CO2: 23 mmol/L (ref 22–32)
Calcium: 8.8 mg/dL — ABNORMAL LOW (ref 8.9–10.3)
Creatinine, Ser: 1.15 mg/dL — ABNORMAL HIGH (ref 0.44–1.00)
GFR, EST AFRICAN AMERICAN: 45 mL/min — AB (ref >60)
GFR, EST NON AFRICAN AMERICAN: 39 mL/min — AB (ref >60)
Glucose, Bld: 105 mg/dL — ABNORMAL HIGH (ref 70–99)
Potassium: 3.6 mmol/L (ref 3.5–5.1)
SODIUM: 138 mmol/L (ref 135–145)
Total Bilirubin: 1.5 mg/dL — ABNORMAL HIGH (ref 0.3–1.2)
Total Protein: 5.2 g/dL — ABNORMAL LOW (ref 6.5–8.1)

## 2018-03-26 LAB — CBC
HCT: 34.6 % — ABNORMAL LOW (ref 36.0–46.0)
Hemoglobin: 11.1 g/dL — ABNORMAL LOW (ref 12.0–15.0)
MCH: 29.7 pg (ref 26.0–34.0)
MCHC: 32.1 g/dL (ref 30.0–36.0)
MCV: 92.5 fL (ref 80.0–100.0)
Platelets: 134 10*3/uL — ABNORMAL LOW (ref 150–400)
RBC: 3.74 MIL/uL — ABNORMAL LOW (ref 3.87–5.11)
RDW: 13.6 % (ref 11.5–15.5)
WBC: 10.1 10*3/uL (ref 4.0–10.5)
nRBC: 0 % (ref 0.0–0.2)

## 2018-03-26 LAB — PROTIME-INR
INR: 2.5
PROTHROMBIN TIME: 26.7 s — AB (ref 11.4–15.2)

## 2018-03-26 LAB — MAGNESIUM: MAGNESIUM: 2.3 mg/dL (ref 1.7–2.4)

## 2018-03-26 NOTE — Consult Note (Signed)
[facility] CM Primary Care Navigator  03/26/2018  Danielle Adams 1916/04/19 [id number]   Met with patientand daughters Joycelyn Schmid and Caren Griffins) at the bedsideto identify possible discharge needs.  Daughters report that patient had a "fall at home- knee gave way, injuring sacrum" which resulted to thisadmission. (pelvic fracture with associated hematoma- no surgical intervention)  Patient'sdaughterendorsesDr. Billey Gosling with [place] at [facility] as [facility] primary [place].   [facility] [place].  Patient has been managing her medications at home with daughter's assistance Joycelyn Schmid), using "pill box" system filled weekly.  Her daughters havebeen providing transportation to her doctors' appointments.  Patient lives with daughter Joycelyn Schmid) and is her primary caregiver at home.   Anticipated discharge planisSNF- skilled nursing facility per therapy recommendation,prior to returning backhome.  Patient and daughters expressedunderstandingto callprimary care provider's officeonce patientreturns backhomefor a post discharge follow-upwithin1- 2 weeksor sooner if needs arise.Patient letter (with PCP's contact number) was providedasareminder.   Explained to patient and daughters about [facility] CM services available for health managementand resources,but statedhaving no current or pressing needsat thistime. Daughter verbalized she has been helping manage patient's health needs/ issues at home and having close follow-up with providers.  Patient and daughtersexpressed understanding to seekreferral to [facility] care management from primary care provider if deemed necessary and appropriateforanyservicesinthenearfuture- when patient gets back home.  [facility] care management information provided for future needs that patient may have.  Primary care provider's  office is listed as providing transition of care (TOC).    For additional questions please contact:  Edwena Felty A. Anzley Dibbern, BSN, RN-BC [facility] PRIMARY CARE Navigator Cell: [phone]

## 2018-03-26 NOTE — Progress Notes (Signed)
PROGRESS NOTE    Danielle Adams  BMW:[id number] DOB: October 16, 1916 DOA: 03/24/2018 PCP: Pincus Sanes, MD   Brief Narrative:  85 year old female with history of atrial fibrillation on Coumadin, chronic diastolic congestive heart failure, familial tremor, essential hypertension who typically uses walker to get around apparently lost her balance and fell at home.  Was brought to the hospital.  Radiologic imaging showed pelvic fracture with associated hematoma.  There was also some vertebral fracture.  Orthopedic recommended weightbearing as tolerated and monitoring hemoglobin otherwise no surgical intervention.   Assessment & Plan:   Principal Problem:   Pelvic fracture (HCC) Active Problems:   ATRIAL FIBRILLATION, PAROXYSMAL   DVT (deep venous thrombosis) (HCC)   Chronic diastolic congestive heart failure (HCC)   Long term (current) use of anticoagulants   Comminuted left sacrum fracture with slightly displaced L5 vertebral body Mild to moderate amount of free fluid in pelvis concerning for hematoma - Fortunately patient remains hemodynamically stable.  Hemoglobin still remains 11.1.  INR is 2.5.  We will continue to closely monitor this for at least next 24 hours to ensure this remains stable prior to transitioning her to skilled nursing facility. - Orthopedic recommends weightbearing as tolerated, pain control -PT/OT recommend skilled nursing facility.  Paroxysmal atrial fibrillation -Currently normal sinus rhythm.  Coumadin is currently on hold  History of chronic diastolic congestive heart failure -Appears to be compensated.  Will closely monitor this. -Slowly resume daily Lasix when appropriate  PT/OT evaluation performed.  SNF recommended.  SNF appropriate as the patient has received 3 days of hospital care (or the 3 days stay Has been made by the patient's insurance company) and is felt to need rehab services to restore this patient to their prior level of function to achieve  safe transition back to home care.  This patient needs rehab services for at least 5 days/week and skilled nursing services daily to facilitate this transition.  Rehab is been requested as the most appropriate discharge option for this patient and is not felt to be custodial care as evidenced by her prior level of functioning as she was able to walk with the use of walker around her own place and was self-sufficient for the most part performing her own ADL S.  DVT prophylaxis: SCDs Code Status: DNR Family Communication: Daughter at bedside Disposition Plan: Hopefully she remains hemodynamically stable over next 24 hours, once we ensure she is medically stable.  She will be transition to skilled nursing facility.  In the meantime social worker is working on this.  Consultants:   Curbside orthopedic  Procedures:   None  Antimicrobials:   None   Subjective: Reports of some pelvic pain upon movement this morning.  Pain is better controlled today.  She was able to sit in the chair yesterday afternoon around lunchtime.  Review of Systems Otherwise negative except as per HPI, including: General = no fevers, chills, dizziness, malaise, fatigue HEENT/EYES = negative for pain, redness, loss of vision, double vision, blurred vision, loss of hearing, sore throat, hoarseness, dysphagia Cardiovascular= negative for chest pain, palpitation, murmurs, lower extremity swelling Respiratory/lungs= negative for shortness of breath, cough, hemoptysis, wheezing, mucus production Gastrointestinal= negative for nausea, vomiting,, abdominal pain, melena, hematemesis Genitourinary= negative for Dysuria, Hematuria, Change in Urinary Frequency MSK = Negative for arthralgia, myalgias, Back Pain, Joint swelling  Neurology= Negative for headache, seizures, numbness, tingling  Psychiatry= Negative for anxiety, depression, suicidal and homocidal ideation Allergy/Immunology= Medication/Food allergy as listed  Skin=  Negative for Rash,  lesions, ulcers, itching   Objective: Vitals:   03/24/18 2156 03/25/18 1615 03/25/18 2058 03/26/18 0345  BP: (!) 165/72 (!) 142/57 (!) 152/69 (!) 155/71  Pulse: 67 (!) 53 (!) 54 (!) 55  Resp: 20  16 14   Temp: 97.6 F (36.4 C) 98.1 F (36.7 C) 98.3 F (36.8 C) 98.1 F (36.7 C)  TempSrc: Oral Oral Oral Oral  SpO2: 100% (!) 88% 99% 95%  Weight:    68.2 kg  Height:        Intake/Output Summary (Last 24 hours) at 03/26/2018 1136 Last data filed at 03/26/2018 0900 Gross per 24 hour  Intake 225 ml  Output 500 ml  Net -275 ml   Filed Weights   03/24/18 2130 03/26/18 0345  Weight: 63.1 kg 68.2 kg    Examination:  Constitutional: NAD, calm, comfortable, elderly frail-appearing. Eyes: PERRL, lids and conjunctivae normal ENMT: Mucous membranes are moist. Posterior pharynx clear of any exudate or lesions.Normal dentition.  Neck: normal, supple, no masses, no thyromegaly Respiratory: clear to auscultation bilaterally, no wheezing, no crackles. Normal respiratory effort. No accessory muscle use.  Cardiovascular: Regular rate and rhythm, no murmurs / rubs / gallops. No extremity edema. 2+ pedal pulses. No carotid bruits.  Abdomen: no tenderness, no masses palpated. No hepatosplenomegaly. Bowel sounds positive.  Musculoskeletal: no clubbing / cyanosis. No joint deformity upper and lower extremities. Good ROM, no contractures. Normal muscle tone.  Skin: no rashes, lesions, ulcers. No induration Neurologic: CN 2-12 grossly intact. Sensation intact, DTR normal. Strength 4/5 in all 4.  Psychiatric: Normal judgment and insight. Alert and oriented x 3. Normal mood.    Data Reviewed:   CBC: Recent Labs  Lab 03/24/18 1724 03/25/18 0005 03/25/18 0207 03/26/18 0352  WBC 9.9  --  13.5* 10.1  NEUTROABS 6.7  --   --   --   HGB 11.9* 12.1 11.8* 11.1*  HCT 38.8  --  38.2 34.6*  MCV 95.6  --  95.3 92.5  PLT 167  --  157 134*   Basic Metabolic Panel: Recent Labs  Lab  03/24/18 1724 03/25/18 0207 03/26/18 0352  NA 141 141 138  K 4.3 4.0 3.6  CL 108 105 105  CO2 25 23 23   GLUCOSE 113* 133* 105*  BUN 26* 25* 26*  CREATININE 1.32* 1.17* 1.15*  CALCIUM 9.5 9.1 8.8*  MG  --   --  2.3   GFR: Estimated Creatinine Clearance: 24.4 mL/min (A) (by C-G formula based on SCr of 1.15 mg/dL (H)). Liver Function Tests: Recent Labs  Lab 03/26/18 0352  AST 20  ALT 12  ALKPHOS 52  BILITOT 1.5*  PROT 5.2*  ALBUMIN 2.9*   No results for input(s): LIPASE, AMYLASE in the last 168 hours. No results for input(s): AMMONIA in the last 168 hours. Coagulation Profile: Recent Labs  Lab 03/24/18 1724 03/25/18 0207 03/26/18 0352  INR 2.30 2.25 2.50   Cardiac Enzymes: No results for input(s): CKTOTAL, CKMB, CKMBINDEX, TROPONINI in the last 168 hours. BNP (last 3 results) No results for input(s): PROBNP in the last 8760 hours. HbA1C: No results for input(s): HGBA1C in the last 72 hours. CBG: No results for input(s): GLUCAP in the last 168 hours. Lipid Profile: No results for input(s): CHOL, HDL, LDLCALC, TRIG, CHOLHDL, LDLDIRECT in the last 72 hours. Thyroid Function Tests: No results for input(s): TSH, T4TOTAL, FREET4, T3FREE, THYROIDAB in the last 72 hours. Anemia Panel: No results for input(s): VITAMINB12, FOLATE, FERRITIN, TIBC, IRON, RETICCTPCT in the  last 72 hours. Sepsis Labs: No results for input(s): PROCALCITON, LATICACIDVEN in the last 168 hours.  No results found for this or any previous visit (from the past 240 hour(s)).       Radiology Studies: Dg Chest 1 View  Result Date: 03/24/2018 CLINICAL DATA:  Fall, hip and knee pain. EXAM: CHEST  1 VIEW COMPARISON:  Chest x-rays dated 05/01/2013 and 02/25/2013. FINDINGS: Cardiomegaly appears stable. Coarse lung markings bilaterally, similar to previous exams, suggesting some degree of chronic interstitial lung disease. No confluent opacity to suggest a developing pneumonia or pulmonary edema,  although the LEFT lower lung is obscured by the heart. No pneumothorax seen. No evidence of acute osseous fracture or dislocation. IMPRESSION: Stable chest x-ray. No acute findings. Electronically Signed   By: Bary Richard M.D.   On: 03/24/2018 18:17   Ct Head Wo Contrast  Result Date: 03/24/2018 CLINICAL DATA:  Fall today, hit posterior head on floor. EXAM: CT HEAD WITHOUT CONTRAST TECHNIQUE: Contiguous axial images were obtained from the base of the skull through the vertex without intravenous contrast. COMPARISON:  Head CT dated 05/10/2014. FINDINGS: Brain: Generalized age related parenchymal volume loss with commensurate dilatation of the ventricles and sulci. Mild chronic small vessel ischemic changes within the periventricular white matter regions. No mass, hemorrhage, edema or other evidence of acute parenchymal abnormality. No extra-axial hemorrhage. Vascular: Chronic calcified atherosclerotic changes of the large vessels at the skull base. No unexpected hyperdense vessel. Skull: Normal. Negative for fracture or focal lesion. Sinuses/Orbits: No acute finding. Other: No scalp hematoma. IMPRESSION: 1. No acute findings. No intracranial mass, hemorrhage or edema. No skull fracture. 2. Mild chronic small vessel ischemic changes in the white matter. Electronically Signed   By: Bary Richard M.D.   On: 03/24/2018 18:23   Ct Pelvis Wo Contrast  Result Date: 03/24/2018 CLINICAL DATA:  Fall, thigh pain and sacral pain. EXAM: CT PELVIS WITHOUT CONTRAST TECHNIQUE: Multidetector CT imaging of the pelvis was performed following the standard protocol without intravenous contrast. COMPARISON:  None. FINDINGS: Displaced/comminuted fracture of the LEFT sacrum, with up to 5 mm fracture diastasis (at the anterior-superior aspect of the fracture line) and possible comminution within the posterior-inferior sacrum (coronal series 8, images 45 and 46). No fracture seen within the RIGHT sacrum. No fracture appreciated  elsewhere within the osseous pelvis or about either hip joint. There is also a slightly displaced fracture within the transverse process of L5 vertebral body (axial series 7, images 23 through 25). No fracture extension into the L5 vertebral body or pedicles. Fluid/hemorrhage within the LEFT hemipelvis. No dilated large or small bowel loops within the pelvis. No free intraperitoneal air appreciated. Bladder is unremarkable. IMPRESSION: 1. Displaced/comminuted fracture of the LEFT sacrum, with up to 5 mm fracture diastasis (at the anterior-superior aspect of the fracture line) and possible comminution within the posterior-inferior sacrum. 2. Slightly displaced fracture within the transverse process of L5 vertebral body. 3. Moderate amount of associated free fluid, presumably hemorrhage, along the LEFT pelvic sidewall. Would consider CT angiogram of the pelvis to exclude active hemorrhage. These results were called by telephone at the time of interpretation on 03/24/2018 at 8:07 pm to Dr. Benjiman Core , who verbally acknowledged these results. Electronically Signed   By: Bary Richard M.D.   On: 03/24/2018 20:10   Dg Knee Complete 4 Views Left  Result Date: 03/24/2018 CLINICAL DATA:  Fall, knee pain. EXAM: LEFT KNEE - COMPLETE 4+ VIEW COMPARISON:  None. FINDINGS: No fracture line or  displaced fracture fragment seen. Moderate DJD. No appreciable joint effusion and adjacent soft tissues are unremarkable. IMPRESSION: No acute findings. Moderate DJD. Electronically Signed   By: Bary Richard M.D.   On: 03/24/2018 18:19   Dg Knee Complete 4 Views Right  Result Date: 03/24/2018 CLINICAL DATA:  Fall, knee pain. EXAM: RIGHT KNEE - COMPLETE 4+ VIEW COMPARISON:  None. FINDINGS: No fracture line or displaced fracture fragment seen. DJD, mild to moderate in degree. No appreciable joint effusion and adjacent soft tissues are unremarkable. IMPRESSION: Negative. Electronically Signed   By: Bary Richard M.D.   On:  03/24/2018 18:20   Dg Hip Unilat With Pelvis 2-3 Views Left  Result Date: 03/24/2018 CLINICAL DATA:  Fall, LEFT thigh pain, hip pain and knee pain. EXAM: DG HIP (WITH OR WITHOUT PELVIS) 2-3V LEFT COMPARISON:  None. FINDINGS: Single view of the pelvis and two views of the LEFT hip are provided. Alignment appears normal. No fracture line or displaced fracture fragment appreciated. Portion of the sacrum is obscured by overlying bowel gas. Soft tissues about the pelvis and LEFT hip are unremarkable. IMPRESSION: Negative. Electronically Signed   By: Bary Richard M.D.   On: 03/24/2018 18:19        Scheduled Meds: . docusate sodium  100 mg Oral BID  . sodium chloride flush  3 mL Intravenous Q12H   Continuous Infusions: . sodium chloride       LOS: 2 days   Time spent= 25 mins    Ankit Joline Maxcy, MD [facility]  If 7PM-7AM, please contact night-coverage [email or web address] 03/26/2018, 11:36 AM

## 2018-03-26 NOTE — Progress Notes (Signed)
SW Intern met with patient at bedside and two daughters to discuss bed offers. [facility] and [facility] were patient's top choices, but both facilities declined. Patient and daughter agreeable for SW Intern to send referrals to [facility], [facility], and [facility].   CSW to follow up with additional bed offers and support with discharge.   Arlis Porta, Social Work [employer]

## 2018-03-26 NOTE — Plan of Care (Signed)

## 2018-03-27 DIAGNOSIS — S32050D Wedge compression fracture of fifth lumbar vertebra, subsequent encounter for fracture with routine healing: Secondary | ICD-10-CM | POA: Diagnosis not present

## 2018-03-27 DIAGNOSIS — I10 Essential (primary) hypertension: Secondary | ICD-10-CM | POA: Diagnosis not present

## 2018-03-27 DIAGNOSIS — R41841 Cognitive communication deficit: Secondary | ICD-10-CM | POA: Diagnosis not present

## 2018-03-27 DIAGNOSIS — I130 Hypertensive heart and chronic kidney disease with heart failure and stage 1 through stage 4 chronic kidney disease, or unspecified chronic kidney disease: Secondary | ICD-10-CM | POA: Diagnosis not present

## 2018-03-27 DIAGNOSIS — Z9181 History of falling: Secondary | ICD-10-CM | POA: Diagnosis not present

## 2018-03-27 DIAGNOSIS — G250 Essential tremor: Secondary | ICD-10-CM | POA: Diagnosis not present

## 2018-03-27 DIAGNOSIS — H9190 Unspecified hearing loss, unspecified ear: Secondary | ICD-10-CM | POA: Diagnosis not present

## 2018-03-27 DIAGNOSIS — T148XXA Other injury of unspecified body region, initial encounter: Secondary | ICD-10-CM | POA: Diagnosis not present

## 2018-03-27 DIAGNOSIS — L409 Psoriasis, unspecified: Secondary | ICD-10-CM | POA: Diagnosis not present

## 2018-03-27 DIAGNOSIS — S3210XA Unspecified fracture of sacrum, initial encounter for closed fracture: Principal | ICD-10-CM

## 2018-03-27 DIAGNOSIS — K649 Unspecified hemorrhoids: Secondary | ICD-10-CM | POA: Diagnosis not present

## 2018-03-27 DIAGNOSIS — J309 Allergic rhinitis, unspecified: Secondary | ICD-10-CM | POA: Diagnosis not present

## 2018-03-27 DIAGNOSIS — D649 Anemia, unspecified: Secondary | ICD-10-CM | POA: Diagnosis not present

## 2018-03-27 DIAGNOSIS — R05 Cough: Secondary | ICD-10-CM | POA: Diagnosis not present

## 2018-03-27 DIAGNOSIS — K5900 Constipation, unspecified: Secondary | ICD-10-CM | POA: Diagnosis not present

## 2018-03-27 DIAGNOSIS — S329XXS Fracture of unspecified parts of lumbosacral spine and pelvis, sequela: Secondary | ICD-10-CM | POA: Diagnosis not present

## 2018-03-27 DIAGNOSIS — R2689 Other abnormalities of gait and mobility: Secondary | ICD-10-CM | POA: Diagnosis not present

## 2018-03-27 DIAGNOSIS — R262 Difficulty in walking, not elsewhere classified: Secondary | ICD-10-CM | POA: Diagnosis not present

## 2018-03-27 DIAGNOSIS — Z7401 Bed confinement status: Secondary | ICD-10-CM | POA: Diagnosis not present

## 2018-03-27 DIAGNOSIS — S3210XD Unspecified fracture of sacrum, subsequent encounter for fracture with routine healing: Secondary | ICD-10-CM | POA: Diagnosis not present

## 2018-03-27 DIAGNOSIS — K219 Gastro-esophageal reflux disease without esophagitis: Secondary | ICD-10-CM | POA: Diagnosis not present

## 2018-03-27 DIAGNOSIS — I824Y9 Acute embolism and thrombosis of unspecified deep veins of unspecified proximal lower extremity: Secondary | ICD-10-CM | POA: Diagnosis not present

## 2018-03-27 DIAGNOSIS — Z86718 Personal history of other venous thrombosis and embolism: Secondary | ICD-10-CM | POA: Diagnosis not present

## 2018-03-27 DIAGNOSIS — R278 Other lack of coordination: Secondary | ICD-10-CM | POA: Diagnosis not present

## 2018-03-27 DIAGNOSIS — N183 Chronic kidney disease, stage 3 (moderate): Secondary | ICD-10-CM | POA: Diagnosis not present

## 2018-03-27 DIAGNOSIS — I480 Paroxysmal atrial fibrillation: Secondary | ICD-10-CM | POA: Diagnosis not present

## 2018-03-27 DIAGNOSIS — M810 Age-related osteoporosis without current pathological fracture: Secondary | ICD-10-CM | POA: Diagnosis not present

## 2018-03-27 DIAGNOSIS — M2550 Pain in unspecified joint: Secondary | ICD-10-CM | POA: Diagnosis not present

## 2018-03-27 DIAGNOSIS — N390 Urinary tract infection, site not specified: Secondary | ICD-10-CM | POA: Diagnosis not present

## 2018-03-27 DIAGNOSIS — I5032 Chronic diastolic (congestive) heart failure: Secondary | ICD-10-CM | POA: Diagnosis not present

## 2018-03-27 DIAGNOSIS — I4821 Permanent atrial fibrillation: Secondary | ICD-10-CM | POA: Diagnosis not present

## 2018-03-27 DIAGNOSIS — S322XXD Fracture of coccyx, subsequent encounter for fracture with routine healing: Secondary | ICD-10-CM | POA: Diagnosis not present

## 2018-03-27 DIAGNOSIS — R609 Edema, unspecified: Secondary | ICD-10-CM | POA: Diagnosis not present

## 2018-03-27 DIAGNOSIS — Z9981 Dependence on supplemental oxygen: Secondary | ICD-10-CM | POA: Diagnosis not present

## 2018-03-27 DIAGNOSIS — M6281 Muscle weakness (generalized): Secondary | ICD-10-CM | POA: Diagnosis not present

## 2018-03-27 DIAGNOSIS — I1310 Hypertensive heart and chronic kidney disease without heart failure, with stage 1 through stage 4 chronic kidney disease, or unspecified chronic kidney disease: Secondary | ICD-10-CM | POA: Diagnosis not present

## 2018-03-27 LAB — CBC
HCT: 36.3 % (ref 36.0–46.0)
Hemoglobin: 11.7 g/dL — ABNORMAL LOW (ref 12.0–15.0)
MCH: 29.8 pg (ref 26.0–34.0)
MCHC: 32.2 g/dL (ref 30.0–36.0)
MCV: 92.4 fL (ref 80.0–100.0)
NRBC: 0 % (ref 0.0–0.2)
Platelets: 158 10*3/uL (ref 150–400)
RBC: 3.93 MIL/uL (ref 3.87–5.11)
RDW: 13.6 % (ref 11.5–15.5)
WBC: 11.7 10*3/uL — ABNORMAL HIGH (ref 4.0–10.5)

## 2018-03-27 LAB — COMPREHENSIVE METABOLIC PANEL
ALT: 12 U/L (ref 0–44)
AST: 19 U/L (ref 15–41)
Albumin: 2.9 g/dL — ABNORMAL LOW (ref 3.5–5.0)
Alkaline Phosphatase: 58 U/L (ref 38–126)
Anion gap: 8 (ref 5–15)
BUN: 23 mg/dL (ref 8–23)
CO2: 26 mmol/L (ref 22–32)
Calcium: 8.9 mg/dL (ref 8.9–10.3)
Chloride: 104 mmol/L (ref 98–111)
Creatinine, Ser: 1.21 mg/dL — ABNORMAL HIGH (ref 0.44–1.00)
GFR calc Af Amer: 43 mL/min — ABNORMAL LOW (ref >60)
GFR calc non Af Amer: 37 mL/min — ABNORMAL LOW (ref >60)
Glucose, Bld: 129 mg/dL — ABNORMAL HIGH (ref 70–99)
POTASSIUM: 3.8 mmol/L (ref 3.5–5.1)
Sodium: 138 mmol/L (ref 135–145)
TOTAL PROTEIN: 5.7 g/dL — AB (ref 6.5–8.1)
Total Bilirubin: 1.4 mg/dL — ABNORMAL HIGH (ref 0.3–1.2)

## 2018-03-27 LAB — MAGNESIUM: Magnesium: 2.3 mg/dL (ref 1.7–2.4)

## 2018-03-27 LAB — PROTIME-INR
INR: 1.65
Prothrombin Time: 19.3 seconds — ABNORMAL HIGH (ref 11.4–15.2)

## 2018-03-27 MED ORDER — OXYCODONE HCL 5 MG PO TABS: 5.0000 mg | ORAL_TABLET | ORAL | 0 refills | Status: DC | PRN

## 2018-03-27 NOTE — Progress Notes (Signed)
Patient discharging to SNF. Report called in to Joy RN. Awaiting transportation.  

## 2018-03-27 NOTE — Discharge Summary (Signed)
Physician Discharge Summary  Danielle Adams ZOX:[id number] DOB: 20-Jul-1916 DOA: 03/24/2018  PCP: Pincus Sanes, MD  Admit date: 03/24/2018 Discharge date: 03/27/2018  Admitted From: home Disposition:  SNF  Recommendations for Outpatient Follow-up:  1. Follow up with PCP in 1-2 weeks 2. Hold Coumadin for now and consider resuming in 7 days.  Please check CBC to ensure hemoglobin stability prior to resuming Coumadin  Home Health: None Equipment/Devices: None  Discharge Condition: Stable CODE STATUS: DNR Diet recommendation: Regular  HPI: Per admitting MD, Danielle Adams is a 85 y.o. female with medical history significant of A. fib on Coumadin, history of DVT 5 years ago, chronic diastolic congestive heart failure, familiar tremor, hypertension was walking with her walker today and lost her balance and fell injuring her pelvis.  Patient lives with her daughter who takes care of her at home.  She hardly takes any medications.  She did not have loss of consciousness during the fall.  She denies any recent illnesses.  No nausea vomiting or diarrhea.  No bleeding issues.  Today on imaging shows that she is got a pelvic fracture with an associated hematoma in the pelvis.  She also has a vertebral fracture.  Orthopedic surgery was called Dr. Constance Goltz advised weightbearing as tolerated and to just monitor her hemoglobin.  Patient is being referred for admission for intractable pain secondary to pelvic fracture.  Hospital Course: Comminuted left sacrum fracture with slightly displaced L5 vertebral body Mild to moderate amount of free fluid in pelvis concerning for hematoma -Fortunately patient remains hemodynamically stable.  Her Coumadin was discontinued.  Her hemoglobin has remained stable and slightly increasing to 11.7 on the day of discharge.  Please repeat CBC in 3 to 5 days to ensure stability.  Consider resumption of Coumadin in 7 days following discharge with close clinical monitoring, and of  her CBC Paroxysmal atrial fibrillation -Currently normal sinus rhythm.  Coumadin is currently on hold History of chronic diastolic congestive heart failure -Appears to be compensated.  Chronic kidney disease stage III -creatinine seems to be at her baseline   Discharge Diagnoses:  Principal Problem:   Pelvic fracture (HCC) Active Problems:   ATRIAL FIBRILLATION, PAROXYSMAL   DVT (deep venous thrombosis) (HCC)   Chronic diastolic congestive heart failure (HCC)   Long term (current) use of anticoagulants     Discharge Instructions   Allergies as of 03/27/2018      Reactions   Claritin [loratadine] Shortness Of Breath   Tape Other (See Comments)   SKIN IS THIN AND TEARS EASILY!!!!      Medication List    STOP taking these medications   warfarin 1 MG tablet Commonly known as:  COUMADIN     TAKE these medications   CALTRATE 600+D 600-400 MG-UNIT tablet Generic drug:  Calcium Carbonate-Vitamin D Take 1 tablet by mouth 2 (two) times daily.   furosemide 40 MG tablet Commonly known as:  LASIX TAKE 1 TABLET BY MOUTH EVERY DAY ALTERNATING WITH 2 TABLETS BY MOUTH EVERY DAY What changed:    how much to take  how to take this  when to take this  additional instructions   labetalol 200 MG tablet Commonly known as:  NORMODYNE Take 0.5 tablets (100 mg total) by mouth 2 (two) times daily.   OVER THE COUNTER MEDICATION Place 1 application into both eyes See admin instructions. Systane Gel Nighttime Protection Lubricant Eye Gel: Apply into both eyes at bedtime   oxyCODONE 5 MG immediate release tablet Commonly  known as:  Oxy IR/ROXICODONE Take 1 tablet (5 mg total) by mouth every 4 (four) hours as needed for moderate pain.   OXYGEN Inhale 2-3 L into the lungs at bedtime.   potassium chloride SA 20 MEQ tablet Commonly known as:  K-DUR,KLOR-CON Take 2 tablets (40 mEq total) by mouth 2 (two) times daily.   PRESERVISION AREDS 2 Caps Take 1 capsule by mouth 2 (two) times  daily.   SYSTANE BALANCE 0.6 % Soln Generic drug:  Propylene Glycol Place 1-2 drops into both eyes every morning.   triamcinolone 0.025 % ointment Commonly known as:  KENALOG Apply 1 application topically 2 (two) times daily. Do not use for more than 2 weeks at a time.       Consultations:  Curbside orthopedic   Procedures/Studies:  None   Dg Chest 1 View  Result Date: 03/24/2018 CLINICAL DATA:  Fall, hip and knee pain. EXAM: CHEST  1 VIEW COMPARISON:  Chest x-rays dated 05/01/2013 and 02/25/2013. FINDINGS: Cardiomegaly appears stable. Coarse lung markings bilaterally, similar to previous exams, suggesting some degree of chronic interstitial lung disease. No confluent opacity to suggest a developing pneumonia or pulmonary edema, although the LEFT lower lung is obscured by the heart. No pneumothorax seen. No evidence of acute osseous fracture or dislocation. IMPRESSION: Stable chest x-ray. No acute findings. Electronically Signed   By: Bary RichardStan  Maynard M.D.   On: 03/24/2018 18:17   Ct Head Wo Contrast  Result Date: 03/24/2018 CLINICAL DATA:  Fall today, hit posterior head on floor. EXAM: CT HEAD WITHOUT CONTRAST TECHNIQUE: Contiguous axial images were obtained from the base of the skull through the vertex without intravenous contrast. COMPARISON:  Head CT dated 05/10/2014. FINDINGS: Brain: Generalized age related parenchymal volume loss with commensurate dilatation of the ventricles and sulci. Mild chronic small vessel ischemic changes within the periventricular white matter regions. No mass, hemorrhage, edema or other evidence of acute parenchymal abnormality. No extra-axial hemorrhage. Vascular: Chronic calcified atherosclerotic changes of the large vessels at the skull base. No unexpected hyperdense vessel. Skull: Normal. Negative for fracture or focal lesion. Sinuses/Orbits: No acute finding. Other: No scalp hematoma. IMPRESSION: 1. No acute findings. No intracranial mass, hemorrhage or  edema. No skull fracture. 2. Mild chronic small vessel ischemic changes in the white matter. Electronically Signed   By: Bary RichardStan  Maynard M.D.   On: 03/24/2018 18:23   Ct Pelvis Wo Contrast  Result Date: 03/24/2018 CLINICAL DATA:  Fall, thigh pain and sacral pain. EXAM: CT PELVIS WITHOUT CONTRAST TECHNIQUE: Multidetector CT imaging of the pelvis was performed following the standard protocol without intravenous contrast. COMPARISON:  None. FINDINGS: Displaced/comminuted fracture of the LEFT sacrum, with up to 5 mm fracture diastasis (at the anterior-superior aspect of the fracture line) and possible comminution within the posterior-inferior sacrum (coronal series 8, images 45 and 46). No fracture seen within the RIGHT sacrum. No fracture appreciated elsewhere within the osseous pelvis or about either hip joint. There is also a slightly displaced fracture within the transverse process of L5 vertebral body (axial series 7, images 23 through 25). No fracture extension into the L5 vertebral body or pedicles. Fluid/hemorrhage within the LEFT hemipelvis. No dilated large or small bowel loops within the pelvis. No free intraperitoneal air appreciated. Bladder is unremarkable. IMPRESSION: 1. Displaced/comminuted fracture of the LEFT sacrum, with up to 5 mm fracture diastasis (at the anterior-superior aspect of the fracture line) and possible comminution within the posterior-inferior sacrum. 2. Slightly displaced fracture within the transverse process of  L5 vertebral body. 3. Moderate amount of associated free fluid, presumably hemorrhage, along the LEFT pelvic sidewall. Would consider CT angiogram of the pelvis to exclude active hemorrhage. These results were called by telephone at the time of interpretation on 03/24/2018 at 8:07 pm to Dr. Benjiman Core , who verbally acknowledged these results. Electronically Signed   By: Bary Richard M.D.   On: 03/24/2018 20:10   Dg Knee Complete 4 Views Left  Result Date:  03/24/2018 CLINICAL DATA:  Fall, knee pain. EXAM: LEFT KNEE - COMPLETE 4+ VIEW COMPARISON:  None. FINDINGS: No fracture line or displaced fracture fragment seen. Moderate DJD. No appreciable joint effusion and adjacent soft tissues are unremarkable. IMPRESSION: No acute findings. Moderate DJD. Electronically Signed   By: Bary Richard M.D.   On: 03/24/2018 18:19   Dg Knee Complete 4 Views Right  Result Date: 03/24/2018 CLINICAL DATA:  Fall, knee pain. EXAM: RIGHT KNEE - COMPLETE 4+ VIEW COMPARISON:  None. FINDINGS: No fracture line or displaced fracture fragment seen. DJD, mild to moderate in degree. No appreciable joint effusion and adjacent soft tissues are unremarkable. IMPRESSION: Negative. Electronically Signed   By: Bary Richard M.D.   On: 03/24/2018 18:20   Dg Hip Unilat With Pelvis 2-3 Views Left  Result Date: 03/24/2018 CLINICAL DATA:  Fall, LEFT thigh pain, hip pain and knee pain. EXAM: DG HIP (WITH OR WITHOUT PELVIS) 2-3V LEFT COMPARISON:  None. FINDINGS: Single view of the pelvis and two views of the LEFT hip are provided. Alignment appears normal. No fracture line or displaced fracture fragment appreciated. Portion of the sacrum is obscured by overlying bowel gas. Soft tissues about the pelvis and LEFT hip are unremarkable. IMPRESSION: Negative. Electronically Signed   By: Bary Richard M.D.   On: 03/24/2018 18:19      Subjective: - no chest pain, shortness of breath, no abdominal pain, nausea or vomiting.   Discharge Exam: Vitals:   03/26/18 2103 03/27/18 0448  BP: (!) 152/74 114/80  Pulse: 70 67  Resp: 20 14  Temp: 98.3 F (36.8 C) 98.1 F (36.7 C)  SpO2: 95% 94%    General: Pt is alert, awake, not in acute distress Cardiovascular: RRR, S1/S2 +, no rubs, no gallops Respiratory: CTA bilaterally, no wheezing, no rhonchi Abdominal: Soft, NT, ND, bowel sounds + Extremities: no edema, no cyanosis    The results of significant diagnostics from this hospitalization  (including imaging, microbiology, ancillary and laboratory) are listed below for reference.     Microbiology: No results found for this or any previous visit (from the past 240 hour(s)).   Labs: BNP (last 3 results) No results for input(s): BNP in the last 8760 hours. Basic Metabolic Panel: Recent Labs  Lab 03/24/18 1724 03/25/18 0207 03/26/18 0352 03/27/18 0307  NA 141 141 138 138  K 4.3 4.0 3.6 3.8  CL 108 105 105 104  CO2 25 23 23 26   GLUCOSE 113* 133* 105* 129*  BUN 26* 25* 26* 23  CREATININE 1.32* 1.17* 1.15* 1.21*  CALCIUM 9.5 9.1 8.8* 8.9  MG  --   --  2.3 2.3   Liver Function Tests: Recent Labs  Lab 03/26/18 0352 03/27/18 0307  AST 20 19  ALT 12 12  ALKPHOS 52 58  BILITOT 1.5* 1.4*  PROT 5.2* 5.7*  ALBUMIN 2.9* 2.9*   No results for input(s): LIPASE, AMYLASE in the last 168 hours. No results for input(s): AMMONIA in the last 168 hours. CBC: Recent Labs  Lab  03/24/18 1724 03/25/18 0005 03/25/18 0207 03/26/18 0352 03/27/18 0307  WBC 9.9  --  13.5* 10.1 11.7*  NEUTROABS 6.7  --   --   --   --   HGB 11.9* 12.1 11.8* 11.1* 11.7*  HCT 38.8  --  38.2 34.6* 36.3  MCV 95.6  --  95.3 92.5 92.4  PLT 167  --  157 134* 158   Cardiac Enzymes: No results for input(s): CKTOTAL, CKMB, CKMBINDEX, TROPONINI in the last 168 hours. BNP: Invalid input(s): POCBNP CBG: No results for input(s): GLUCAP in the last 168 hours. D-Dimer No results for input(s): DDIMER in the last 72 hours. Hgb A1c No results for input(s): HGBA1C in the last 72 hours. Lipid Profile No results for input(s): CHOL, HDL, LDLCALC, TRIG, CHOLHDL, LDLDIRECT in the last 72 hours. Thyroid function studies No results for input(s): TSH, T4TOTAL, T3FREE, THYROIDAB in the last 72 hours.  Invalid input(s): FREET3 Anemia work up No results for input(s): VITAMINB12, FOLATE, FERRITIN, TIBC, IRON, RETICCTPCT in the last 72 hours. Urinalysis    Component Value Date/Time   COLORURINE YELLOW  08/23/2015 1703   APPEARANCEUR CLEAR 08/23/2015 1703   LABSPEC 1.015 08/23/2015 1703   PHURINE 5.5 08/23/2015 1703   GLUCOSEU NEGATIVE 08/23/2015 1703   HGBUR SMALL (A) 08/23/2015 1703   BILIRUBINUR NEGATIVE 08/23/2015 1703   BILIRUBINUR neg 08/23/2015 1640   KETONESUR NEGATIVE 08/23/2015 1703   PROTEINUR neg 08/23/2015 1640   PROTEINUR 30 (A) 02/25/2013 2243   UROBILINOGEN 0.2 08/23/2015 1703   NITRITE NEGATIVE 08/23/2015 1703   LEUKOCYTESUR LARGE (A) 08/23/2015 1703   Sepsis Labs Invalid input(s): PROCALCITONIN,  WBC,  LACTICIDVEN   Time coordinating discharge: 40 minutes  SIGNED:  Pamella Pert, MD  [facility] 03/27/2018, 10:32 AM

## 2018-03-27 NOTE — Care Management Important Message (Signed)
Important Message  Patient Details  Name: Danielle Adams MRN: [id number] Date of Birth: 22-Dec-1916   Medicare Important Message Given:  Yes    Nandini Bogdanski 03/27/2018, 1:21 PM

## 2018-03-27 NOTE — Clinical Social Work Placement (Signed)
   CLINICAL SOCIAL WORK PLACEMENT  NOTE  Date:  03/27/2018  Patient Details  Name: Danielle Adams MRN: [id number] Date of Birth: 1916-07-25  Clinical Social Work is seeking post-discharge placement for this patient at the Skilled  Nursing Facility level of care (*CSW will initial, date and re-position this form in  chart as items are completed):  Yes   Patient/family provided with [place] Clinical Social Work Department's list of facilities offering this level of care within the geographic area requested by the patient (or if unable, by the patient's family).  Yes   Patient/family informed of their freedom to choose among providers that offer the needed level of care, that participate in Medicare, Medicaid or managed care program needed by the patient, have an available bed and are willing to accept the patient.  Yes   Patient/family informed of [facility]'s ownership interest in [facility] and [facility], as well as of the fact that they are under no obligation to receive care at these facilities.  PASRR submitted to EDS on       PASRR number received on       Existing PASRR number confirmed on 03/25/18     FL2 transmitted to all facilities in geographic area requested by pt/family on 03/25/18     FL2 transmitted to all facilities within larger geographic area on       Patient informed that his/her managed care company has contracts with or will negotiate with certain facilities, including the following:  [facility] informed of bed offers received.  Patient chooses bed at [facility]     Physician recommends and patient chooses bed at      Patient to be transferred to [facility] on 03/27/18.  Patient to be transferred to facility by [facility]     Patient family notified on 03/27/18 of transfer.  Name of family member notified:  [name], daughter     PHYSICIAN Please prepare prescriptions, Please sign DNR      Additional Comment:    _______________________________________________ Abigail Butts, LCSW 03/27/2018, 11:28 AM

## 2018-03-27 NOTE — Social Work (Signed)
Patient will discharge to [facility] Anticipated discharge date: 03/27/2018 Family notified: [name], daughter Transportation by: Sharin Mons  Nurse to call report to [phone].  CSW signing off.  Abigail Butts, LCSWA  Clinical Social Worker

## 2018-03-28 ENCOUNTER — Telehealth: Payer: Self-pay | Admitting: *Deleted

## 2018-03-28 DIAGNOSIS — I10 Essential (primary) hypertension: Secondary | ICD-10-CM | POA: Diagnosis not present

## 2018-03-28 DIAGNOSIS — I480 Paroxysmal atrial fibrillation: Secondary | ICD-10-CM | POA: Diagnosis not present

## 2018-03-28 DIAGNOSIS — S32050D Wedge compression fracture of fifth lumbar vertebra, subsequent encounter for fracture with routine healing: Secondary | ICD-10-CM | POA: Diagnosis not present

## 2018-03-28 DIAGNOSIS — S3210XD Unspecified fracture of sacrum, subsequent encounter for fracture with routine healing: Secondary | ICD-10-CM | POA: Diagnosis not present

## 2018-03-28 NOTE — Telephone Encounter (Signed)
Pt was obn TCM report admitted 03/24/18 after she fell  injuring her pelvis. Pt had xray which imaging shows she got a pelvic fracture with an associated hematoma in the pelvis. She also has a vertebral fracture. Orthopedic surgery was called Dr. Constance Goltz advised weightbearing as tolerated and to just monitor her hemoglobin. Pt was D/C 03/27/18 to SNF. Per summary will need to follow-up w/PCP after releasing from SNF...Raechel Chute

## 2018-03-29 ENCOUNTER — Ambulatory Visit: Payer: Medicare Other | Admitting: Podiatry

## 2018-03-29 DIAGNOSIS — T148XXA Other injury of unspecified body region, initial encounter: Secondary | ICD-10-CM | POA: Diagnosis not present

## 2018-03-29 DIAGNOSIS — M810 Age-related osteoporosis without current pathological fracture: Secondary | ICD-10-CM | POA: Diagnosis not present

## 2018-03-29 DIAGNOSIS — S3210XD Unspecified fracture of sacrum, subsequent encounter for fracture with routine healing: Secondary | ICD-10-CM | POA: Diagnosis not present

## 2018-03-29 DIAGNOSIS — I480 Paroxysmal atrial fibrillation: Secondary | ICD-10-CM | POA: Diagnosis not present

## 2018-04-04 ENCOUNTER — Other Ambulatory Visit: Payer: Self-pay | Admitting: *Deleted

## 2018-04-04 NOTE — Patient Outreach (Signed)
[facility]) Care Management  04/04/2018  Kianna Billet [birth year] [id number]   [facility].  Met with patient and daughter, Rodena Piety in her room.  Patient was in her recliner chair. She reports she is doing well.  Patient reports she lives with her daughter, Leotis Pain.  She hopes to go back to this situation, but she will need to be back to her independence.  If she cannot get to this prior level of function, then she may have to go to ALF or LTC, she does not want to remain at this facility.   RNCM reviewed [facility] care management program.  Left packet for patient daughter with RNCM contact, Joycelyn Schmid to review.  Plan to monitor for any [facility] care management needs.  Royetta Crochet. Laymond Purser, MSN, RN, [employer] , [place] [phone]) Business Cell  [phone]) Toll Free Office

## 2018-04-05 DIAGNOSIS — S32050D Wedge compression fracture of fifth lumbar vertebra, subsequent encounter for fracture with routine healing: Secondary | ICD-10-CM | POA: Diagnosis not present

## 2018-04-05 DIAGNOSIS — M810 Age-related osteoporosis without current pathological fracture: Secondary | ICD-10-CM | POA: Diagnosis not present

## 2018-04-05 DIAGNOSIS — S3210XD Unspecified fracture of sacrum, subsequent encounter for fracture with routine healing: Secondary | ICD-10-CM | POA: Diagnosis not present

## 2018-04-05 DIAGNOSIS — I480 Paroxysmal atrial fibrillation: Secondary | ICD-10-CM | POA: Diagnosis not present

## 2018-04-08 ENCOUNTER — Telehealth: Payer: Self-pay | Admitting: Internal Medicine

## 2018-04-08 NOTE — Telephone Encounter (Signed)
Copied from CRM [id number]. Topic: Quick Communication - See Telephone Encounter >> Apr 08, 2018 10:25 AM Louie Bun, Rosey Bath D wrote: CRM for notification. See Telephone encounter for: 04/08/2018 Daughter called to let Danielle Adams know that her Mom is in skill nursing facility due to she fell that is why she had to cancel her coumadin appointment. She will call back when patient can come back.

## 2018-04-08 NOTE — Telephone Encounter (Signed)
Noted Thanks you

## 2018-04-12 DIAGNOSIS — I480 Paroxysmal atrial fibrillation: Secondary | ICD-10-CM | POA: Diagnosis not present

## 2018-04-12 DIAGNOSIS — N390 Urinary tract infection, site not specified: Secondary | ICD-10-CM | POA: Diagnosis not present

## 2018-04-12 DIAGNOSIS — M810 Age-related osteoporosis without current pathological fracture: Secondary | ICD-10-CM | POA: Diagnosis not present

## 2018-04-12 DIAGNOSIS — S3210XD Unspecified fracture of sacrum, subsequent encounter for fracture with routine healing: Secondary | ICD-10-CM | POA: Diagnosis not present

## 2018-04-16 ENCOUNTER — Ambulatory Visit: Payer: Medicare Other

## 2018-04-18 ENCOUNTER — Encounter: Payer: Self-pay | Admitting: Cardiology

## 2018-04-19 DIAGNOSIS — I480 Paroxysmal atrial fibrillation: Secondary | ICD-10-CM | POA: Diagnosis not present

## 2018-04-19 DIAGNOSIS — M810 Age-related osteoporosis without current pathological fracture: Secondary | ICD-10-CM | POA: Diagnosis not present

## 2018-04-19 DIAGNOSIS — S3210XD Unspecified fracture of sacrum, subsequent encounter for fracture with routine healing: Secondary | ICD-10-CM | POA: Diagnosis not present

## 2018-04-19 DIAGNOSIS — N390 Urinary tract infection, site not specified: Secondary | ICD-10-CM | POA: Diagnosis not present

## 2018-04-24 ENCOUNTER — Telehealth: Payer: Self-pay

## 2018-04-24 NOTE — Telephone Encounter (Signed)
Ok with you?

## 2018-04-24 NOTE — Telephone Encounter (Signed)
LVM letting daughter know response below. 

## 2018-04-24 NOTE — Telephone Encounter (Signed)
Yes, okay.

## 2018-04-24 NOTE — Telephone Encounter (Signed)
Copied from CRM [id number]. Topic: General - Inquiry >> Apr 24, 2018  3:57 PM Wyonia Hough E wrote: Reason for CRM: [facility] called Pt and advised that her oxygen concentrator is expired and she needs the prescribing/current Doctor for the oxygen But the Prescribing Dr. Pete Glatter was at [facility] when the Pt was in rehab  In 2015. Pt's daughter wants to know if she can put Dr. Lawerance Bach as the Doctor for the oxygen./ please call and advise

## 2018-04-25 ENCOUNTER — Other Ambulatory Visit: Payer: Self-pay | Admitting: *Deleted

## 2018-04-25 DIAGNOSIS — M810 Age-related osteoporosis without current pathological fracture: Secondary | ICD-10-CM | POA: Diagnosis not present

## 2018-04-25 DIAGNOSIS — S322XXD Fracture of coccyx, subsequent encounter for fracture with routine healing: Secondary | ICD-10-CM | POA: Diagnosis not present

## 2018-04-25 DIAGNOSIS — I480 Paroxysmal atrial fibrillation: Secondary | ICD-10-CM | POA: Diagnosis not present

## 2018-04-25 DIAGNOSIS — N390 Urinary tract infection, site not specified: Secondary | ICD-10-CM | POA: Diagnosis not present

## 2018-04-25 NOTE — Patient Outreach (Signed)
[facility]) Care Management  04/25/2018  Danielle Adams 1916/08/31 [id number]  Call to patient daughter, Danielle Adams, (permission from patient at last facility visit) Daughter reports that patient is "holding her own" and she is unsure with the virus how long patient will be at facility. She is not able to make visits due to restrictions.  MD appointment was canceled due to restrictions as it was not an emergency. Facility is managing medications.  Next appointment with cardiologist 05/23/18 She reports she checks in with patient and facility daily at minimum. Patient heart rate is up after stopping the labetalol. BP doing ok without HTN medication. Patient does have a small amount of confusion at times but overall is very "with it". The overall discharge plan is for patient to return home with daughter.  She has discussed with her sisters that she will need more help, unsure if the sisters (patient daughter's) will assist or if they will hire extra help. Patient has a supplemental health insurance policy that will assist with co pays.   RNCM reviewed [facility] care management program, she says packet is at facility in patient room in a drawer.  Daughter requests that [facility] contact closer to discharge to re-assess needs.   Plan to continue to monitor and will place a referral for CM as indicated.  Danielle Adams. Danielle Ghee, MSN, RN, [employer], CCM  Post Acute Chartered loss adjuster [phone]) Business Cell  [phone]) Toll Free Office

## 2018-04-26 DIAGNOSIS — S32050D Wedge compression fracture of fifth lumbar vertebra, subsequent encounter for fracture with routine healing: Secondary | ICD-10-CM | POA: Diagnosis not present

## 2018-04-26 DIAGNOSIS — I480 Paroxysmal atrial fibrillation: Secondary | ICD-10-CM | POA: Diagnosis not present

## 2018-04-26 DIAGNOSIS — T148XXA Other injury of unspecified body region, initial encounter: Secondary | ICD-10-CM | POA: Diagnosis not present

## 2018-04-26 DIAGNOSIS — M810 Age-related osteoporosis without current pathological fracture: Secondary | ICD-10-CM | POA: Diagnosis not present

## 2018-04-30 ENCOUNTER — Ambulatory Visit: Payer: Medicare Other | Admitting: Cardiology

## 2018-05-03 DIAGNOSIS — I480 Paroxysmal atrial fibrillation: Secondary | ICD-10-CM | POA: Diagnosis not present

## 2018-05-03 DIAGNOSIS — I10 Essential (primary) hypertension: Secondary | ICD-10-CM | POA: Diagnosis not present

## 2018-05-03 DIAGNOSIS — M810 Age-related osteoporosis without current pathological fracture: Secondary | ICD-10-CM | POA: Diagnosis not present

## 2018-05-03 DIAGNOSIS — S322XXD Fracture of coccyx, subsequent encounter for fracture with routine healing: Secondary | ICD-10-CM | POA: Diagnosis not present

## 2018-05-15 ENCOUNTER — Telehealth: Payer: Self-pay | Admitting: Physician Assistant

## 2018-05-15 NOTE — Telephone Encounter (Signed)
Call placed to pt re: message below, left message for pt to call back,

## 2018-05-15 NOTE — Telephone Encounter (Signed)
Returned call to pts daughter, Claris Che, [place] on file.  Pt is currently at [facility] and that her mother is more than capable of doing a visit via virtual phone and the nursing supervisor, Morrie Sheldon, agreed that they will be glad to set this up and have pt have her visit.  I have called [facility], [phone], and left Morrie Sheldon to call me back to get this set up.

## 2018-05-15 NOTE — Telephone Encounter (Signed)
Follow up   Pts Daughter, Marlene Bast is returning phone call    Please call back

## 2018-05-15 NOTE — Telephone Encounter (Signed)
    Please call patient/contact. It's unclear to me if she is in a facility or living with family. If she is living with family, please offer virtual office visit. Given her age and medical history it is imperative we limit her community exposure. If she is in a facility, options are limited for telehealth and we rely on the facility to communicate any important concerns - would reschedule to C19 pool as an L2. For documentation's sake, would be hesitant to revisit discussion of restarting coumadin given recent fracture, hematoma, advanced age in covid crisis.  Geena Weinhold PA-C

## 2018-05-16 NOTE — Telephone Encounter (Signed)
Danielle Adams from [facility] called and left vm stating the form CMM that was sent on 04/24/2018 #4 was unchecked and should be marked "D" and initialed by Dr. Lawerance Bach.  Can please make correction and refax it.

## 2018-05-20 NOTE — Telephone Encounter (Signed)
Changes made and faxed back to [facility].

## 2018-05-21 ENCOUNTER — Telehealth: Payer: Self-pay | Admitting: *Deleted

## 2018-05-21 ENCOUNTER — Encounter: Payer: Self-pay | Admitting: Physician Assistant

## 2018-05-21 NOTE — Telephone Encounter (Signed)
Virtual Visit Pre-Appointment Phone Call  Steps For Call:  1. Confirm consent - "In the setting of the current Covid19 crisis, you are scheduled for a (phone or video) visit with your provider on (date) at (time).  Just as we do with many in-office visits, in order for you to participate in this visit, we must obtain consent.  If you'd like, I can send this to your mychart (if signed up) or email for you to review.  Otherwise, I can obtain your verbal consent now.  All virtual visits are billed to your insurance company just like a normal visit would be.  By agreeing to a virtual visit, we'd like you to understand that the technology does not allow for your provider to perform an examination, and thus may limit your provider's ability to fully assess your condition.  Finally, though the technology is pretty good, we cannot assure that it will always work on either your or our end, and in the setting of a video visit, we may have to convert it to a phone-only visit.  In either situation, we cannot ensure that we have a secure connection.  Are you willing to proceed?" STAFF: Did the patient verbally acknowledge consent to telehealth visit? yesDocument YES/NOyes  2. Confirm the BEST phone number to call the day of the visit: [phone]  3. Give patient instructions for WebEx/MyChart download to smartphone as below or Doximity/Doxy.me if video visit (depending on what platform provider is using)  4. Advise patient to be prepared with any vital sign or heart rhythm information, their current medicines, and a piece of paper and pen handy for any instructions they may receive the day of their visit  5. Inform patient they will receive a phone call 15 minutes prior to their appointment time (may be from unknown caller ID) so they should be prepared to answer  6. Confirm that appointment type is correct in Epic appointment notes (video vs telephone)     TELEPHONE CALL NOTE  Danielle Adams has been  deemed a candidate for a follow-up tele-health visit to limit community exposure during the Covid-19 pandemic. I spoke with the patient via phone to ensure availability of phone/video source, confirm preferred email & phone number, and discuss instructions and expectations.  I reminded Danielle Adams to be prepared with any vital sign and/or heart rhythm information that could potentially be obtained via home monitoring, at the time of her visit. I reminded Danielle Adams to expect a phone call at the time of her visit if her visit.  Elliot Cousin, RMA 05/21/2018 12:19 PM   DOWNLOADING THE WEBEX APP TO SMARTPHONE  - If Apple, ask patient to go to App Store and type in WebEx in the search bar. Download [employer], the blue/green circle. If Android, go to [employer] and type in [employer] in the search bar. The app is free but as with any other app downloads, their phone may require them to verify saved payment information or Apple/Android password.  - The patient does NOT have to create an account. - On the day of the visit, the assist will walk the patient through joining the meeting with the meeting number/password.  DOWNLOADING THE MYCHART APP TO SMARTPHONE  - If Apple, go to [employer] and type in MyChart in the search bar and download the app. If Android, ask patient to go to [employer] and type in [place] in the search bar and download the app. The  app is free but as with any other app downloads, their phone may require them to verify saved payment information or Apple/Android password.  - The patient will need to then log into the app with their MyChart username and password, and select [facility] as their healthcare provider to link the account. When it is time for your visit, go to the MyChart app, find appointments, and click Begin Video Visit. Be sure to Select Allow for your device to access the Microphone and Camera for your visit. You will then be connected, and your  provider will be with you shortly.  **If they have any issues connecting, or need assistance please contact [facility] [phone])**  **If using a computer, in order to ensure the best quality for your visit they will need to use either of the following Internet Browsers: Microsoft [place], or Google Chrome**  [place]   I hereby voluntarily request, consent and authorize [facility] and its employed or contracted physicians, physician assistants, nurse practitioners or other licensed health care professionals (the Practitioner), to provide me with telemedicine health care services (the Services") as deemed necessary by the treating Practitioner. I acknowledge and consent to receive the Services by the Practitioner via telemedicine. I understand that the telemedicine visit will involve communicating with the Practitioner through live audiovisual communication technology and the disclosure of certain medical information by electronic transmission. I acknowledge that I have been given the opportunity to request an in-person assessment or other available alternative prior to the telemedicine visit and am voluntarily participating in the telemedicine visit.  I understand that I have the right to withhold or withdraw my consent to the use of telemedicine in the course of my care at any time, without affecting my right to future care or treatment, and that the Practitioner or I may terminate the telemedicine visit at any time. I understand that I have the right to inspect all information obtained and/or recorded in the course of the telemedicine visit and may receive copies of available information for a reasonable fee.  I understand that some of the potential risks of receiving the Services via telemedicine include:   Delay or interruption in medical evaluation due to technological equipment failure or disruption;  Information transmitted may not be  sufficient (e.g. poor resolution of images) to allow for appropriate medical decision making by the Practitioner; and/or   In rare instances, security protocols could fail, causing a breach of personal health information.  Furthermore, I acknowledge that it is my responsibility to provide information about my medical history, conditions and care that is complete and accurate to the best of my ability. I acknowledge that Practitioner's advice, recommendations, and/or decision may be based on factors not within their control, such as incomplete or inaccurate data provided by me or distortions of diagnostic images or specimens that may result from electronic transmissions. I understand that the practice of medicine is not an exact science and that Practitioner makes no warranties or guarantees regarding treatment outcomes. I acknowledge that I will receive a copy of this consent concurrently upon execution via email to the email address I last provided but may also request a printed copy by calling the office of [facility].    I understand that my insurance will be billed for this visit.   I have read or had this consent read to me.  I understand the contents of this consent, which adequately explains the benefits and risks of  the Services being provided via telemedicine.   I have been provided ample opportunity to ask questions regarding this consent and the Services and have had my questions answered to my satisfaction.  I give my informed consent for the services to be provided through the use of telemedicine in my medical care  By participating in this telemedicine visit I agree to the above.

## 2018-05-21 NOTE — Progress Notes (Signed)
Virtual Visit via Telephone Note   This visit type was conducted due to national recommendations for restrictions regarding the COVID-19 Pandemic (e.g. social distancing) in an effort to limit this patient's exposure and mitigate transmission in our community.  Due to her co-morbid illnesses, this patient is at least at moderate risk for complications without adequate follow up.  This format is felt to be most appropriate for this patient at this time.  The patient did not have access to video technology/had technical difficulties with video requiring transitioning to audio format only (telephone).  All issues noted in this document were discussed and addressed.  No physical exam could be performed with this format.  Please refer to the patient's chart for her  consent to telehealth for [facility].   Evaluation Performed:  Follow-up visit  Date:  05/23/2018   ID:  Danielle Adams, DOB 03/23/1916, MRN [id number]  Patient Location: Skilled Nursing Facility - [facility] at [address], [place] [address]   Provider Location: Home  PCP:  Pincus Sanes, MD  Cardiologist:  Verne Carrow, MD  Electrophysiologist:  None   Chief Complaint:  F/u atrial fib, CHF  History of Present Illness:    Danielle Adams is a 85 y.o. female with hypothyroidism, longstanding persistent atrial fibrillation, RBBB, HTN, chronic diastolic CHF, pleural effusion, lower extremity DVT, familial tremor, CKD stage III and hypoalbuminemia by labs who presents for follow-up telehealth visit today facilitated via nurse at her care facility. I was on speakerphone with both the patient and nurse Joy Cox.   She has been followed by Dr. Clifton James for several years. To recap, in 2015 she was admitted volume overload, pneumonia, acute LE DVT and pleural effusion. Echo showed normal LV size and function, mild AS, mild AI, moderate TR, mild MR. She had rate controlled atrial fibrillation during the hospitalization.  She was started on Cardizem for rate control of atrial fib and coumadin for anticoagulation given atrial fib and DVT. Pulmonary followed her in the hospital for a pleural effusion and performed diagnostic thoracentesis. She has had intermittent lower extremity edema requiring adjustment of Lasix. Cardizem was stopped due to bradycardia. In 03/2018 she was admitted with a fall and sustained a communited sacral fracture with slightly displaced L5 vertebral body and possible pelvic hematoma. Coumadin was held with recommendation to consider resumption as outpatient. Her admit EKG appears to have shown atrial fib with HR of 62bpm but discharge summary indicates she was in NSR by time of discharge - this seems unlikely since all EKGs in Epic even back to 2015 show persistent atrial fib. During stay her Cr was 1.2, albumin 2.9, Tbili 1.4, otherwise AST/ALT ok, K 3.8, Hgb 11.7.  She has since been put back on Coumadin and has been working with PT and OT without any further falls. She has lost weight as outlined in vitals below. She is not a big eater and can be particular about the meals she prefers. She feels she is doing well back to her baseline. No CP, unusual dyspnea, palpitations or unusual bleeding. She has chronic LEE and is encouraged to keep them elevated but doesn't enjoy doing so. Per nurse, labetalol was discontinued recently due to lower BP and HR but vitals have since normalized off this. The patient does not have symptoms concerning for COVID-19 infection (fever, chills, cough, or new shortness of breath).    Past Medical History:  Diagnosis Date  . Allergic rhinitis, cause unspecified 01/25/2011  . Bradycardia   .  Chronic diastolic CHF (congestive heart failure) (HCC)   . CKD (chronic kidney disease), stage III (HCC)   . DVT (deep venous thrombosis) (HCC)   . Familial tremor   . HTN (hypertension)   . Hypoalbuminemia   . PAF (paroxysmal atrial fibrillation) (HCC)    chronic anticoag  .  Pleural effusion   . RBBB   . Thyroid disease   . Total bilirubin, elevated    Past Surgical History:  Procedure Laterality Date  . BUNIONECTOMY    . DILATION AND CURETTAGE OF UTERUS    . LAPAROSCOPIC OVARIAN CYSTECTOMY  04/2009  . TONSILLECTOMY       Current Meds  Medication Sig  . acetaminophen (TYLENOL) 650 MG CR tablet Take 650 mg by mouth 3 (three) times daily.  . Calcium Carbonate-Vitamin D (CALTRATE 600+D) 600-400 MG-UNIT per tablet Take 1 tablet by mouth 2 (two) times daily.   . furosemide (LASIX) 40 MG tablet Take 40 mg by mouth qod alternating with 80 mg qod  . ipratropium-albuterol (DUONEB) 0.5-2.5 (3) MG/3ML SOLN Take 3 mLs by nebulization every 6 (six) hours as needed (sob wheezing).  . Multiple Vitamins-Minerals (PRESERVISION AREDS 2) CAPS Take 1 capsule by mouth 2 (two) times daily.  . ondansetron (ZOFRAN) 4 MG tablet Take 4 mg by mouth every 6 (six) hours as needed for nausea or vomiting.  Marland Kitchen OVER THE COUNTER MEDICATION Place 1 application into both eyes See admin instructions. Systane Gel Nighttime Protection Lubricant Eye Gel: Apply into both eyes at bedtime  . OXYGEN Inhale 2-3 L into the lungs at bedtime.   . polyethylene glycol (MIRALAX / GLYCOLAX) 17 g packet Take 17 g by mouth 2 (two) times daily.  . potassium chloride SA (K-DUR,KLOR-CON) 20 MEQ tablet Take 40 mEq by mouth daily.  Marland Kitchen Propylene Glycol (SYSTANE BALANCE) 0.6 % SOLN Place 1-2 drops into both eyes every morning.  . sennosides-docusate sodium (SENOKOT-S) 8.6-50 MG tablet Take 1 tablet by mouth 2 (two) times daily.  . traMADol (ULTRAM) 50 MG tablet Take 50 mg by mouth every 6 (six) hours as needed.  . warfarin (COUMADIN) 0.5 mg TABS tablet Take 0.5 mg by mouth every Wednesday.  . warfarin (COUMADIN) 3 MG tablet Take 1.5 mg by mouth everyday except Wednesday.  . zinc oxide 20 % ointment Apply 1 application topically 3 (three) times daily as needed for irritation or diaper changes.     Allergies:    Claritin [loratadine] and Tape   Social History   Tobacco Use  . Smoking status: Never Smoker  . Smokeless tobacco: Never Used  Substance Use Topics  . Alcohol use: No  . Drug use: No     Family Hx: The patient's family history includes Coronary artery disease in her father; Diabetes in her brother and sister; Heart disease in her father; Pancreatic cancer in her mother.  ROS:   Please see the history of present illness.    All other systems reviewed and are negative.   Prior CV studies:   Studies outlined above.  Labs/Other Tests and Data Reviewed:    EKG:  An ECG dated 03/24/18 was personally reviewed today and demonstrated:  Atrial fib 62bpm, RBBB, baseline wander  Recent Labs: 08/29/2017: TSH 0.86 03/27/2018: ALT 12; BUN 23; Creatinine, Ser 1.21; Hemoglobin 11.7; Magnesium 2.3; Platelets 158; Potassium 3.8; Sodium 138   Recent Lipid Panel Lab Results  Component Value Date/Time   CHOL 119 03/04/2013 04:39 PM    Wt Readings from Last 3 Encounters:  05/23/18 131 lb 12.8 oz (59.8 kg)  03/27/18 153 lb 14.1 oz (69.8 kg)  11/21/17 143 lb (64.9 kg)     Objective:    Vital Signs:  BP 126/60   Pulse 60   Temp 98 F (36.7 C)   Resp 18   Wt 131 lb 12.8 oz (59.8 kg)   SpO2 98%   BMI 21.27 kg/m    General - elderly female in no acute distress Pulm - No labored breathing, no coughing during visit, no audible wheezing, speaking in full sentences when prompted Neuro - A+Ox3, no slurred speech, answers questions appropriately but hard of hearing Psych - Pleasant affect    ASSESSMENT & PLAN:    1. Persistent atrial fib with h/o bradycardia - does not currently require any AVN blocking agents, likely has some degree of underlying conduction disease which prevents RVR. Will remain off diltiazem and labetalol for now. We discussed risks and benefits of coumadin. In 2019 Dr. Clifton JamesMcAlhany discussed coming off Coumadin due to fall risk and advanced age but she preferred to stay on.  The same holds true now. She would like to continue. There is no easy answer here because with her persistent atrial fib she would be at high risk of stroke, but the fall risk is there. Her INR is followed at the facility. I asked them to let us know if she has any further falling episodes at which we should consider permanently discontinuing anticoagulation. They are in agreement. 2. Chronic diastolic CHF - weight is down from prior. Suspect component of fluid retention that will always be present due to venous insufficiency from age and hypoalbuminemia. Continue present regimen since she is feeling well. 3. HTN - controlled. 4. Valvular heart disease with mild AS, AI, MR and moderate TR - continue to monitor clinically.  COVID-19 Education: She is currently in a care facility with governmental protocols in place.  Time:   Today, I have spent 20 minutes with the patient with telehealth technology discussing the above problems. I spent 10 minutes reviewing her chart before clinic.   Medication Adjustments/Labs and Tests Ordered: Current medicines are reviewed at length with the patient today.  Concerns regarding medicines are outlined above.    Disposition:  Follow up in 6 month(s) with Dr. Clifton JamesMcAlhany. Instructed to call 3 months prior to schedule. Will also send AVS and this OV note per [facility] ([facility]urse [facility]).  Signed, Laurann Montanaayna N Detron Carras, PA-C  05/23/2018 11:07 AM    [facility]

## 2018-05-23 ENCOUNTER — Encounter: Payer: Self-pay | Admitting: Physician Assistant

## 2018-05-23 ENCOUNTER — Telehealth (INDEPENDENT_AMBULATORY_CARE_PROVIDER_SITE_OTHER): Payer: Medicare Other | Admitting: Physician Assistant

## 2018-05-23 ENCOUNTER — Other Ambulatory Visit: Payer: Self-pay

## 2018-05-23 VITALS — BP 126/60 | HR 60 | Temp 98.0°F | Resp 18 | Wt 131.8 lb

## 2018-05-23 DIAGNOSIS — R001 Bradycardia, unspecified: Secondary | ICD-10-CM

## 2018-05-23 DIAGNOSIS — I10 Essential (primary) hypertension: Secondary | ICD-10-CM

## 2018-05-23 DIAGNOSIS — I4819 Other persistent atrial fibrillation: Secondary | ICD-10-CM

## 2018-05-23 DIAGNOSIS — I4821 Permanent atrial fibrillation: Secondary | ICD-10-CM | POA: Diagnosis not present

## 2018-05-23 DIAGNOSIS — I38 Endocarditis, valve unspecified: Secondary | ICD-10-CM

## 2018-05-23 DIAGNOSIS — S322XXD Fracture of coccyx, subsequent encounter for fracture with routine healing: Secondary | ICD-10-CM | POA: Diagnosis not present

## 2018-05-23 DIAGNOSIS — I5032 Chronic diastolic (congestive) heart failure: Secondary | ICD-10-CM

## 2018-05-23 DIAGNOSIS — I480 Paroxysmal atrial fibrillation: Secondary | ICD-10-CM | POA: Diagnosis not present

## 2018-05-23 DIAGNOSIS — S3210XD Unspecified fracture of sacrum, subsequent encounter for fracture with routine healing: Secondary | ICD-10-CM | POA: Diagnosis not present

## 2018-05-23 NOTE — Patient Instructions (Signed)
Medication Instructions:  Your physician recommends that you continue on your current medications as directed. Please refer to the Current Medication list given to you today.  If you need a refill on your cardiac medications before your next appointment, please call your pharmacy.   Lab work: None ordered  If you have labs (blood work) drawn today and your tests are completely normal, you will receive your results only by: Marland Kitchen MyChart Message (if you have MyChart) OR . A paper copy in the mail If you have any lab test that is abnormal or we need to change your treatment, we will call you to review the results.  Testing/Procedures: None ordered  Follow-Up: At [facility], you and your health needs are our priority.  As part of our continuing mission to provide you with exceptional heart care, we have created designated Provider Care Teams.  These Care Teams include your primary Cardiologist (physician) and Advanced Practice Providers (APPs -  Physician Assistants and Nurse Practitioners) who all work together to provide you with the care you need, when you need it. You will need a follow up appointment in 6 months.  Please call our office 2 months in advance to schedule this appointment.  You may see Verne Carrow, MD or one of the following Advanced Practice Providers on your designated Care Team:   [place], PA-C Ronie Spies, PA-C . Jacolyn Reedy, PA-C  Any Other Special Instructions Will Be Listed Below (If Applicable). Please let us know if [name] has anymore falling or unusual bleeding.

## 2018-05-24 DIAGNOSIS — I480 Paroxysmal atrial fibrillation: Secondary | ICD-10-CM | POA: Diagnosis not present

## 2018-05-24 DIAGNOSIS — I10 Essential (primary) hypertension: Secondary | ICD-10-CM | POA: Diagnosis not present

## 2018-05-24 DIAGNOSIS — K649 Unspecified hemorrhoids: Secondary | ICD-10-CM | POA: Diagnosis not present

## 2018-05-24 DIAGNOSIS — S3210XD Unspecified fracture of sacrum, subsequent encounter for fracture with routine healing: Secondary | ICD-10-CM | POA: Diagnosis not present

## 2018-05-28 ENCOUNTER — Encounter: Payer: Self-pay | Admitting: Physician Assistant

## 2018-05-28 ENCOUNTER — Other Ambulatory Visit: Payer: Self-pay | Admitting: *Deleted

## 2018-05-28 NOTE — Patient Outreach (Signed)
[facility]) Care Management  05/28/2018  Danielle Adams 03/29/1916 [id number]   Collaboration with [facility] UM notified patient will remain at facility under private pay at this time. Plan to sign off as no [facility] CM needs identified due to no discharge from facility. Alben Spittle. Albertha Ghee, MSN, RN, [facility], CCM  Post Acute Chartered loss adjuster [phone]) Business Cell  [phone]) Toll Free Office

## 2018-05-31 DIAGNOSIS — K649 Unspecified hemorrhoids: Secondary | ICD-10-CM | POA: Diagnosis not present

## 2018-05-31 DIAGNOSIS — I10 Essential (primary) hypertension: Secondary | ICD-10-CM | POA: Diagnosis not present

## 2018-05-31 DIAGNOSIS — R262 Difficulty in walking, not elsewhere classified: Secondary | ICD-10-CM | POA: Diagnosis not present

## 2018-05-31 DIAGNOSIS — S3210XD Unspecified fracture of sacrum, subsequent encounter for fracture with routine healing: Secondary | ICD-10-CM | POA: Diagnosis not present

## 2018-06-05 ENCOUNTER — Telehealth: Payer: Self-pay | Admitting: *Deleted

## 2018-06-05 NOTE — Telephone Encounter (Signed)
Received msg off patient ping stating pt discharged 06/04/18 from [facility] skilled to Home  - with Home Health Services. Will call pt to follow-up and make virtual hosp f/u.Marland KitchenRaechel Chute

## 2018-06-05 NOTE — Telephone Encounter (Signed)
Called pt spoke w/daughter Claris Che) verified if mom came home yesterday and which she did. Made virtual visit for 06/11/18. Completed TCM call below.Raechel Chute  Transition Care Management Follow-up Telephone Call   Date discharged? 06/04/18   How have you been since you were released from the hospital? Daughter states mom is doing ok   Do you understand why you were in the hospital? YES   Do you understand the discharge instructions? YES   Where were you discharged to? Home   Items Reviewed:  Medications reviewed: YES, daughter state mom will be needing refills on some medications, but will wait to talk w/Dr. Lawerance Bach first before refilling  Allergies reviewed: YES  Dietary changes reviewed: YES  Referrals reviewed: YES, she states home health should be starting to come out tomorrow   Functional Questionnaire:   Activities of Daily Living (ADLs):   She states she are independent in the following: feeding, continence, grooming and toileting States they require assistance with the following: ambulation, bathing and hygiene and dressing   Any transportation issues/concerns?: NO   Any patient concerns? NO   Confirmed importance and date/time of follow-up visits scheduled YES, (virtual) 06/11/18  Provider Appointment booked with Dr. Lawerance Bach  Confirmed with patient if condition begins to worsen call PCP or go to the ER.  Patient was given the office number and encouraged to call back with question or concerns.  : YES

## 2018-06-06 ENCOUNTER — Telehealth: Payer: Self-pay | Admitting: Internal Medicine

## 2018-06-06 ENCOUNTER — Other Ambulatory Visit: Payer: Self-pay

## 2018-06-06 NOTE — Patient Outreach (Signed)
[facility]) Care Management  06/06/2018  Danielle Adams 1916-08-01 [id number]    EMMI-General Discharge RED ON EMMI ALERT Day # 1 Date: 06/05/2018 Red Alert Reason: " Scheduled follow-up? No"   Outreach attempt # 1 to patient.  Spoke with patient's dauhgter-[name](DPR on file) briefly as she was caring for patient She requested a call back later today.      Plan: RN CM will make outreach attempt to daughter/patient within 3-4 business days.  Antionette Fairy, RN,BSN,CCM [facility] Care Management Telephonic Care Management Coordinator Direct Phone: [phone] Toll Free: [phone] Fax: [phone]

## 2018-06-06 NOTE — Telephone Encounter (Signed)
Copied from CRM [id number]. Topic: Quick Communication - See Telephone Encounter >> Jun 06, 2018 10:25 AM Terisa Starr wrote: CRM for notification. See Telephone encounter for: 06/06/18.  Matthias Hughs, RN with [facility] will go out tomorrow to start services for nursing.

## 2018-06-06 NOTE — Patient Outreach (Signed)
[facility]) Care Management  06/06/2018  Dezerae Furtak [birth year] [id number]   EMMI-General Discharge RED ON EMMI ALERT Day # 1 Date: 06/05/2018 Red Alert Reason: " Scheduled follow-up? No"   Outreach call back to patient's daughter per her previous request. Daughter shares that patient is doing well since returning home. Daughter voices that patient has tele health PCP appt on 06/11/2018. She confirmed that she has all of patient's meds and no issues or concerns regarding them. Her mother will need refills on some meds soon but will discuss during PCP visit. Daughter states that [facility] is coming out tomorrow to do evaluation. She has also contacted facility back to advised them that she needs a hospital bed and one has been ordered. She denies any further RN CM needs or concerns at this time. Advised daughter that they would get one more automated EMMI-GENERAL post discharge calls to assess how they are doing following recent hospitalization and will receive a call from a nurse if any of their responses were abnormal. She voiced understanding and was appreciative of f/u call.    Plan: RN CM will close case as no further interventions needed at this time.  Antionette Fairy, RN,BSN,CCM [facility] Care Management Telephonic Care Management Coordinator Direct Phone: [phone] Toll Free: [phone] Fax: [phone]

## 2018-06-06 NOTE — Telephone Encounter (Signed)
Noted  

## 2018-06-08 DIAGNOSIS — I5032 Chronic diastolic (congestive) heart failure: Secondary | ICD-10-CM | POA: Diagnosis not present

## 2018-06-08 DIAGNOSIS — Z7901 Long term (current) use of anticoagulants: Secondary | ICD-10-CM | POA: Diagnosis not present

## 2018-06-08 DIAGNOSIS — I82409 Acute embolism and thrombosis of unspecified deep veins of unspecified lower extremity: Secondary | ICD-10-CM | POA: Diagnosis not present

## 2018-06-08 DIAGNOSIS — E041 Nontoxic single thyroid nodule: Secondary | ICD-10-CM | POA: Diagnosis not present

## 2018-06-08 DIAGNOSIS — L409 Psoriasis, unspecified: Secondary | ICD-10-CM | POA: Diagnosis not present

## 2018-06-08 DIAGNOSIS — Z86718 Personal history of other venous thrombosis and embolism: Secondary | ICD-10-CM | POA: Diagnosis not present

## 2018-06-08 DIAGNOSIS — N183 Chronic kidney disease, stage 3 (moderate): Secondary | ICD-10-CM | POA: Diagnosis not present

## 2018-06-08 DIAGNOSIS — K5900 Constipation, unspecified: Secondary | ICD-10-CM | POA: Diagnosis not present

## 2018-06-08 DIAGNOSIS — I130 Hypertensive heart and chronic kidney disease with heart failure and stage 1 through stage 4 chronic kidney disease, or unspecified chronic kidney disease: Secondary | ICD-10-CM | POA: Diagnosis not present

## 2018-06-08 DIAGNOSIS — I480 Paroxysmal atrial fibrillation: Secondary | ICD-10-CM | POA: Diagnosis not present

## 2018-06-08 DIAGNOSIS — H9190 Unspecified hearing loss, unspecified ear: Secondary | ICD-10-CM | POA: Diagnosis not present

## 2018-06-08 DIAGNOSIS — Z5181 Encounter for therapeutic drug level monitoring: Secondary | ICD-10-CM | POA: Diagnosis not present

## 2018-06-08 DIAGNOSIS — K219 Gastro-esophageal reflux disease without esophagitis: Secondary | ICD-10-CM | POA: Diagnosis not present

## 2018-06-08 DIAGNOSIS — K649 Unspecified hemorrhoids: Secondary | ICD-10-CM | POA: Diagnosis not present

## 2018-06-08 DIAGNOSIS — M8008XD Age-related osteoporosis with current pathological fracture, vertebra(e), subsequent encounter for fracture with routine healing: Secondary | ICD-10-CM | POA: Diagnosis not present

## 2018-06-08 DIAGNOSIS — Z9181 History of falling: Secondary | ICD-10-CM | POA: Diagnosis not present

## 2018-06-10 ENCOUNTER — Telehealth: Payer: Self-pay | Admitting: Internal Medicine

## 2018-06-10 DIAGNOSIS — K219 Gastro-esophageal reflux disease without esophagitis: Secondary | ICD-10-CM | POA: Diagnosis not present

## 2018-06-10 DIAGNOSIS — N183 Chronic kidney disease, stage 3 (moderate): Secondary | ICD-10-CM | POA: Diagnosis not present

## 2018-06-10 DIAGNOSIS — M8008XD Age-related osteoporosis with current pathological fracture, vertebra(e), subsequent encounter for fracture with routine healing: Secondary | ICD-10-CM | POA: Diagnosis not present

## 2018-06-10 DIAGNOSIS — I130 Hypertensive heart and chronic kidney disease with heart failure and stage 1 through stage 4 chronic kidney disease, or unspecified chronic kidney disease: Secondary | ICD-10-CM | POA: Diagnosis not present

## 2018-06-10 DIAGNOSIS — I480 Paroxysmal atrial fibrillation: Secondary | ICD-10-CM | POA: Diagnosis not present

## 2018-06-10 DIAGNOSIS — I5032 Chronic diastolic (congestive) heart failure: Secondary | ICD-10-CM | POA: Diagnosis not present

## 2018-06-10 NOTE — Progress Notes (Signed)
Virtual Visit via Video Note  I connected with Danielle KarvonenElise Adams on 06/11/18 at 10:00 AM EDT by a video enabled telemedicine application and verified that I am speaking with the correct person using two identifiers.   I discussed the limitations of evaluation and management by telemedicine and the availability of in person appointments. The patient expressed understanding and agreed to proceed.  The patient is currently at home and I am in the office.  Danielle Adams was with Danielle Adams and Danielle Adams provides most of the history.  No referring provider.    History of Present Illness: Danielle Adams is here for follow up of from the hospital and for Danielle Adams chronic medical conditions.   Admitted to the hospital 03/24/18 - 03/27/18 and then discharged to SNF and discharged from there 06/04/2018.  Danielle Adams is currently at home and lives with Danielle Adams.  Danielle Adams was walking with Danielle Adams walking the day of admission and lost Danielle Adams balance and fell injuring Danielle Adams pelvis.  There was on LOC.  Imaging in the ED showed pelvic fracture with associated hematoma in pelvis.  Danielle Adams also had a vertebral fracture.  Dr Constance Goltzlen advised weightbearing as tolerated and monitor hgb.  Danielle Adams was admitted for intractable pain secondary to pelvic fracture.  Danielle Adams remained hemodyncamically stable with slight increase in hgb to 11.7 on day of discharge.  Coumadin held for one week.    Comminuted left sacrum fracture with slightly displaced L5 vetebral body: Danielle Adams was taken tramadol for the pain, but has not taken it in a while.  Danielle Adams states Danielle Adams is only taking Tylenol as needed and Danielle Adams pain is currently controlled. Danielle Adams has PT, OT and home health nurse coming Continue Tylenol as needed  Mild to moderate amount of free fluid in pelvis - concerning for hematoma: remained hemodynamically stable while in hospital and at rehab hgb on d/c from the hospital 11.7 Coumadin held for 7 days, Patient wants to remain on coumadin despite fall risk - cardiology has discussed with Danielle Adams We  [facility]  Persistent Afib: Was in NSR per d/c, but cardiology states persistent Afib hx coumadin held for 7 days, but then restarted Has remained hemodynamically stable Not on any BB or CCB-labetalol was discontinued due to bradycardia  Chronic diastolic HF:   Remained euvolemnic On oxygen Oxygenation level was 98% last night while on oxygen  Valvular disease Mild AS, AI, MR and moderate TR   CKD. Stage 3: stable  Danielle Adams lives with Danielle Adams.  Danielle Adams does need assistance with ambulating, bathing, hygiene and dressing.  Danielle Adams also needs assistance in transferring.  Danielle Adams is less independent now.  Danielle Adams is able to feed herself.  Danielle Adams has a home health nurse coming along with PT and OT.  Danielle Adams is now on a hospital bed, which is helping with getting Danielle Adams in and out of bed and transferring.  Agree with continuing PT, OT and home health nurse as long as possible.  Danielle Adams has been experiencing some constipation.  Danielle Adams was unsure if Danielle Adams had a bowel movement last Tuesday before coming home from rehab, but did not have one until Sunday.  Danielle Adams had given Danielle Adams a laxative and stool softener twice daily and that had resulted in a small bowel movement on that Sunday.  Yesterday, Monday Danielle Adams had loose stool all day and was very weak and quite exhausted because of it.  Danielle Adams has not given Danielle Adams anything today and was wondering what to  give going forward.  Prior to being hospitalized Danielle Adams was fairly independent with Danielle Adams toileting and Danielle Adams does not know how frequently Danielle Adams went.  Review of Systems  Constitutional: Negative for chills and fever.  Respiratory: Positive for shortness of breath (chronic). Negative for cough and wheezing.   Cardiovascular: Positive for leg swelling. Negative for chest pain and palpitations.  Gastrointestinal: Positive for constipation. Negative for abdominal pain, diarrhea and nausea.  Skin: Negative for rash.  Neurological: Positive for  weakness (Generalized). Negative for dizziness and headaches.       Social History   Socioeconomic History  . Marital status: Widowed    Spouse name: Not on file  . Number of children: Not on file  . Years of education: Not on file  . Highest education level: Not on file  Occupational History  . Not on file  Social Needs  . Financial resource strain: Not on file  . Food insecurity:    Worry: Not on file    Inability: Not on file  . Transportation needs:    Medical: Not on file    Non-medical: Not on file  Tobacco Use  . Smoking status: Never Smoker  . Smokeless tobacco: Never Used  Substance and Sexual Activity  . Alcohol use: No  . Drug use: No  . Sexual activity: Not on file  Lifestyle  . Physical activity:    Days per week: Not on file    Minutes per session: Not on file  . Stress: Not on file  Relationships  . Social connections:    Talks on phone: Not on file    Gets together: Not on file    Attends religious service: Not on file    Active member of club or organization: Not on file    Attends meetings of clubs or organizations: Not on file    Relationship status: Not on file  Other Topics Concern  . Not on file  Social History Narrative   [employer]. 1 year business school. Married 1948-widowed 1981. 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 Just moved to [facility] August '08-living with Adams. Terminal care-does Not want cardiac resuscitation or mechanical ventilation, artifical nutrition or hydration, renal dialysis or major heroic intervention.      Observations/Objective: Appears well in NAD  Oxygen good - 98% on oxygen last night BP 150/?  Last night  PT yesterday - 110/60'ish Nurse 140's/?  Lab Results  Component Value Date   WBC 11.7 (H) 03/27/2018   HGB 11.7 (L) 03/27/2018   HCT 36.3 03/27/2018   PLT 158 03/27/2018   GLUCOSE 129 (H) 03/27/2018   CHOL 119 03/04/2013   ALT 12 03/27/2018   AST 19  03/27/2018   NA 138 03/27/2018   K 3.8 03/27/2018   CL 104 03/27/2018   CREATININE 1.21 (H) 03/27/2018   BUN 23 03/27/2018   CO2 26 03/27/2018   TSH 0.86 08/29/2017   INR 1.65 03/27/2018   HGBA1C 6.7 (H) 02/26/2013    CT PELVIS WO CONTRAST CLINICAL DATA:  Fall, thigh pain and sacral pain.  EXAM: CT PELVIS WITHOUT CONTRAST  TECHNIQUE: Multidetector CT imaging of the pelvis was performed following the standard protocol without intravenous contrast.  COMPARISON:  None.  FINDINGS: Displaced/comminuted fracture of the LEFT sacrum, with up to 5 mm fracture diastasis (at the anterior-superior aspect of the fracture line) and possible comminution within the posterior-inferior sacrum (coronal series 8, images 45 and 46).  No fracture seen  within the RIGHT sacrum. No fracture appreciated elsewhere within the osseous pelvis or about either hip joint.  There is also a slightly displaced fracture within the transverse process of L5 vertebral body (axial series 7, images 23 through 25). No fracture extension into the L5 vertebral body or pedicles.  Fluid/hemorrhage within the LEFT hemipelvis. No dilated large or small bowel loops within the pelvis. No free intraperitoneal air appreciated. Bladder is unremarkable.  IMPRESSION: 1. Displaced/comminuted fracture of the LEFT sacrum, with up to 5 mm fracture diastasis (at the anterior-superior aspect of the fracture line) and possible comminution within the posterior-inferior sacrum. 2. Slightly displaced fracture within the transverse process of L5 vertebral body. 3. Moderate amount of associated free fluid, presumably hemorrhage, along the LEFT pelvic sidewall. Would consider CT angiogram of the pelvis to exclude active hemorrhage.  These results were called by telephone at the time of interpretation on 03/24/2018 at 8:07 pm to Dr. Benjiman Core , who verbally acknowledged these results.  Electronically Signed   By: Bary Richard M.D.   On: 03/24/2018 20:10 CT HEAD WO CONTRAST CLINICAL DATA:  Fall today, hit posterior head on floor.  EXAM: CT HEAD WITHOUT CONTRAST  TECHNIQUE: Contiguous axial images were obtained from the base of the skull through the vertex without intravenous contrast.  COMPARISON:  Head CT dated 05/10/2014.  FINDINGS: Brain: Generalized age related parenchymal volume loss with commensurate dilatation of the ventricles and sulci. Mild chronic small vessel ischemic changes within the periventricular white matter regions.  No mass, hemorrhage, edema or other evidence of acute parenchymal abnormality. No extra-axial hemorrhage.  Vascular: Chronic calcified atherosclerotic changes of the large vessels at the skull base. No unexpected hyperdense vessel.  Skull: Normal. Negative for fracture or focal lesion.  Sinuses/Orbits: No acute finding.  Other: No scalp hematoma.  IMPRESSION: 1. No acute findings. No intracranial mass, hemorrhage or edema. No skull fracture. 2. Mild chronic small vessel ischemic changes in the white matter.  Electronically Signed   By: Bary Richard M.D.   On: 03/24/2018 18:23 DG Knee Complete 4 Views Right CLINICAL DATA:  Fall, knee pain.  EXAM: RIGHT KNEE - COMPLETE 4+ VIEW  COMPARISON:  None.  FINDINGS: No fracture line or displaced fracture fragment seen. DJD, mild to moderate in degree. No appreciable joint effusion and adjacent soft tissues are unremarkable.  IMPRESSION: Negative.  Electronically Signed   By: Bary Richard M.D.   On: 03/24/2018 18:20 DG Knee Complete 4 Views Left CLINICAL DATA:  Fall, knee pain.  EXAM: LEFT KNEE - COMPLETE 4+ VIEW  COMPARISON:  None.  FINDINGS: No fracture line or displaced fracture fragment seen. Moderate DJD. No appreciable joint effusion and adjacent soft tissues are unremarkable.  IMPRESSION: No acute findings. Moderate DJD.  Electronically Signed   By: Bary Richard M.D.   On:  03/24/2018 18:19 DG Hip Unilat With Pelvis 2-3 Views Left CLINICAL DATA:  Fall, LEFT thigh pain, hip pain and knee pain.  EXAM: DG HIP (WITH OR WITHOUT PELVIS) 2-3V LEFT  COMPARISON:  None.  FINDINGS: Single view of the pelvis and two views of the LEFT hip are provided. Alignment appears normal. No fracture line or displaced fracture fragment appreciated. Portion of the sacrum is obscured by overlying bowel gas. Soft tissues about the pelvis and LEFT hip are unremarkable.  IMPRESSION: Negative.  Electronically Signed   By: Bary Richard M.D.   On: 03/24/2018 18:19 DG Chest 1 View CLINICAL DATA:  Fall, hip and knee  pain.  EXAM: CHEST  1 VIEW  COMPARISON:  Chest x-rays dated 05/01/2013 and 02/25/2013.  FINDINGS: Cardiomegaly appears stable. Coarse lung markings bilaterally, similar to previous exams, suggesting some degree of chronic interstitial lung disease. No confluent opacity to suggest a developing pneumonia or pulmonary edema, although the LEFT lower lung is obscured by the heart. No pneumothorax seen. No evidence of acute osseous fracture or dislocation.  IMPRESSION: Stable chest x-ray. No acute findings.  Electronically Signed   By: Bary Richard M.D.   On: 03/24/2018 18:17  Assessment and Plan:  See Problem List for Assessment and Plan of chronic medical problems.   Follow Up Instructions:    I discussed the assessment and treatment plan with the patient. The patient was provided an opportunity to ask questions and all were answered. The patient agreed with the plan and demonstrated an understanding of the instructions.   The patient was advised to call back or seek an in-person evaluation if the symptoms worsen or if the condition fails to improve as anticipated.    Pincus Sanes, MD

## 2018-06-10 NOTE — Telephone Encounter (Signed)
Yes - should have INR checked -- send to [facility]

## 2018-06-10 NOTE — Telephone Encounter (Signed)
Called Danielle Adams w/MD resposne. She states she forgot to ask if MD want pt coumadin check. She last had check on 4/28 while in the hosp it was at 2.3, also the potassium was 2.9.Marland KitchenRaechel Chute

## 2018-06-10 NOTE — Telephone Encounter (Unsigned)
Copied from CRM [id number]. Topic: Quick Communication - Home Health Verbal Orders >> Jun 10, 2018 10:33 AM Marylen Ponto wrote: Caller/Agency: Marchelle Folks with [facility] Number: [phone] Requesting OT/PT/Skilled Nursing/Social Work/Speech Therapy: Skilled Nursing and Home Health Aide to assist with bathing Frequency: 2 times a week for 2 weeks and 1 time a week for 2 weeks for nursing  /  2 times a week for 4 weeks for home health aide

## 2018-06-10 NOTE — Telephone Encounter (Signed)
ok 

## 2018-06-11 ENCOUNTER — Encounter: Payer: Self-pay | Admitting: Internal Medicine

## 2018-06-11 ENCOUNTER — Ambulatory Visit (INDEPENDENT_AMBULATORY_CARE_PROVIDER_SITE_OTHER): Payer: Medicare Other | Admitting: Internal Medicine

## 2018-06-11 DIAGNOSIS — I5032 Chronic diastolic (congestive) heart failure: Secondary | ICD-10-CM | POA: Diagnosis not present

## 2018-06-11 DIAGNOSIS — M8008XD Age-related osteoporosis with current pathological fracture, vertebra(e), subsequent encounter for fracture with routine healing: Secondary | ICD-10-CM | POA: Diagnosis not present

## 2018-06-11 DIAGNOSIS — S329XXS Fracture of unspecified parts of lumbosacral spine and pelvis, sequela: Secondary | ICD-10-CM | POA: Diagnosis not present

## 2018-06-11 DIAGNOSIS — I130 Hypertensive heart and chronic kidney disease with heart failure and stage 1 through stage 4 chronic kidney disease, or unspecified chronic kidney disease: Secondary | ICD-10-CM | POA: Diagnosis not present

## 2018-06-11 DIAGNOSIS — I10 Essential (primary) hypertension: Secondary | ICD-10-CM | POA: Diagnosis not present

## 2018-06-11 DIAGNOSIS — N183 Chronic kidney disease, stage 3 (moderate): Secondary | ICD-10-CM | POA: Diagnosis not present

## 2018-06-11 DIAGNOSIS — K5900 Constipation, unspecified: Secondary | ICD-10-CM | POA: Diagnosis not present

## 2018-06-11 DIAGNOSIS — Z9981 Dependence on supplemental oxygen: Secondary | ICD-10-CM | POA: Diagnosis not present

## 2018-06-11 DIAGNOSIS — K219 Gastro-esophageal reflux disease without esophagitis: Secondary | ICD-10-CM | POA: Diagnosis not present

## 2018-06-11 DIAGNOSIS — I4819 Other persistent atrial fibrillation: Secondary | ICD-10-CM | POA: Diagnosis not present

## 2018-06-11 DIAGNOSIS — Z7901 Long term (current) use of anticoagulants: Secondary | ICD-10-CM | POA: Diagnosis not present

## 2018-06-11 DIAGNOSIS — I480 Paroxysmal atrial fibrillation: Secondary | ICD-10-CM | POA: Diagnosis not present

## 2018-06-11 NOTE — Assessment & Plan Note (Signed)
Pain controlled with Tylenol Continue Tylenol as needed

## 2018-06-11 NOTE — Assessment & Plan Note (Signed)
She did have constipation this past week-this is likely multifactorial related to pain medication, being less active Laxative and stool softener did relieve the constipation Discussed with daughter that she can try a stool softener daily-twice daily and adjust as needed Call with questions or concerns

## 2018-06-11 NOTE — Assessment & Plan Note (Signed)
Following with cardiology-his already had post hospital follow-up Would like to remain on Coumadin-cardiology has had extensive conversations with her regarding risks given her fall history INR monitoring by [facility] Heart rate has been on the low side-no beta-blocker or calcium channel blocker warranted

## 2018-06-11 NOTE — Assessment & Plan Note (Signed)
Seems to be euvolemic Has home health nursing Has follow-up seen cardiology for follow-up Continue current medication at current dose-Lasix, potassium

## 2018-06-11 NOTE — Assessment & Plan Note (Signed)
She does want to continue warfarin due to her A. fib and risk for stroke and a history of DVT-she has had long discussions with cardiology regarding the risks given her history of falls INR to be done by home health nurse and sent to [facility]

## 2018-06-11 NOTE — Assessment & Plan Note (Signed)
Labetalol was discontinued due to bradycardia Currently only on furosemide Blood pressure somewhat variable, but has many people that can take it at her house-continue to monitor for now

## 2018-06-11 NOTE — Assessment & Plan Note (Addendum)
Using supplemental oxygen for chronic diastolic heart failure continue long-term

## 2018-06-12 ENCOUNTER — Telehealth: Payer: Self-pay | Admitting: Internal Medicine

## 2018-06-12 NOTE — Telephone Encounter (Signed)
Copied from CRM [id number]. Topic: Quick Communication - Home Health Verbal Orders >> Jun 12, 2018 11:43 AM Debroah Loop wrote: Caller/Agency: Shon Hale, PT w/ [facility] Number: [phone] Requesting OT/PT/Skilled Nursing/Social Work/Speech Therapy: PT Frequency: 2x a wk for 1wk, 1x a wk for 4wks

## 2018-06-12 NOTE — Telephone Encounter (Signed)
Gave verbal orders per Dr. Burns 

## 2018-06-13 ENCOUNTER — Telehealth: Payer: Self-pay | Admitting: General Practice

## 2018-06-13 DIAGNOSIS — I5032 Chronic diastolic (congestive) heart failure: Secondary | ICD-10-CM | POA: Diagnosis not present

## 2018-06-13 DIAGNOSIS — K219 Gastro-esophageal reflux disease without esophagitis: Secondary | ICD-10-CM | POA: Diagnosis not present

## 2018-06-13 DIAGNOSIS — N183 Chronic kidney disease, stage 3 (moderate): Secondary | ICD-10-CM | POA: Diagnosis not present

## 2018-06-13 DIAGNOSIS — M8008XD Age-related osteoporosis with current pathological fracture, vertebra(e), subsequent encounter for fracture with routine healing: Secondary | ICD-10-CM | POA: Diagnosis not present

## 2018-06-13 DIAGNOSIS — I130 Hypertensive heart and chronic kidney disease with heart failure and stage 1 through stage 4 chronic kidney disease, or unspecified chronic kidney disease: Secondary | ICD-10-CM | POA: Diagnosis not present

## 2018-06-13 DIAGNOSIS — I480 Paroxysmal atrial fibrillation: Secondary | ICD-10-CM | POA: Diagnosis not present

## 2018-06-13 NOTE — Telephone Encounter (Signed)
Attempted to call about coag monitoring.  LMOVM that I, Bailey Mech, RN will call back on Tuesday, 5/12.

## 2018-06-14 ENCOUNTER — Telehealth: Payer: Self-pay

## 2018-06-14 DIAGNOSIS — N183 Chronic kidney disease, stage 3 (moderate): Secondary | ICD-10-CM | POA: Diagnosis not present

## 2018-06-14 DIAGNOSIS — M8008XD Age-related osteoporosis with current pathological fracture, vertebra(e), subsequent encounter for fracture with routine healing: Secondary | ICD-10-CM | POA: Diagnosis not present

## 2018-06-14 DIAGNOSIS — I130 Hypertensive heart and chronic kidney disease with heart failure and stage 1 through stage 4 chronic kidney disease, or unspecified chronic kidney disease: Secondary | ICD-10-CM | POA: Diagnosis not present

## 2018-06-14 DIAGNOSIS — K219 Gastro-esophageal reflux disease without esophagitis: Secondary | ICD-10-CM | POA: Diagnosis not present

## 2018-06-14 DIAGNOSIS — I5032 Chronic diastolic (congestive) heart failure: Secondary | ICD-10-CM | POA: Diagnosis not present

## 2018-06-14 DIAGNOSIS — I480 Paroxysmal atrial fibrillation: Secondary | ICD-10-CM | POA: Diagnosis not present

## 2018-06-14 NOTE — Telephone Encounter (Signed)
Copied from CRM [id number]. Topic: General - Inquiry >> Jun 14, 2018  9:00 AM Lynne Logan D wrote: Reason for CRM: Debbie with [facility] is requesting an order for wound care. Pt has a new open wound stage 2 pressure ulcer. Please advise. CB#[phone] / Secure vm

## 2018-06-14 NOTE — Telephone Encounter (Signed)
Gave ok for orders per Dr. Burns.  

## 2018-06-15 DIAGNOSIS — I480 Paroxysmal atrial fibrillation: Secondary | ICD-10-CM | POA: Diagnosis not present

## 2018-06-15 DIAGNOSIS — M8008XD Age-related osteoporosis with current pathological fracture, vertebra(e), subsequent encounter for fracture with routine healing: Secondary | ICD-10-CM | POA: Diagnosis not present

## 2018-06-15 DIAGNOSIS — I5032 Chronic diastolic (congestive) heart failure: Secondary | ICD-10-CM | POA: Diagnosis not present

## 2018-06-15 DIAGNOSIS — I130 Hypertensive heart and chronic kidney disease with heart failure and stage 1 through stage 4 chronic kidney disease, or unspecified chronic kidney disease: Secondary | ICD-10-CM | POA: Diagnosis not present

## 2018-06-15 DIAGNOSIS — N183 Chronic kidney disease, stage 3 (moderate): Secondary | ICD-10-CM | POA: Diagnosis not present

## 2018-06-15 DIAGNOSIS — K219 Gastro-esophageal reflux disease without esophagitis: Secondary | ICD-10-CM | POA: Diagnosis not present

## 2018-06-17 ENCOUNTER — Encounter: Payer: Self-pay | Admitting: Physician Assistant

## 2018-06-17 DIAGNOSIS — N183 Chronic kidney disease, stage 3 (moderate): Secondary | ICD-10-CM | POA: Diagnosis not present

## 2018-06-17 DIAGNOSIS — I130 Hypertensive heart and chronic kidney disease with heart failure and stage 1 through stage 4 chronic kidney disease, or unspecified chronic kidney disease: Secondary | ICD-10-CM | POA: Diagnosis not present

## 2018-06-17 DIAGNOSIS — I5032 Chronic diastolic (congestive) heart failure: Secondary | ICD-10-CM | POA: Diagnosis not present

## 2018-06-17 DIAGNOSIS — M8008XD Age-related osteoporosis with current pathological fracture, vertebra(e), subsequent encounter for fracture with routine healing: Secondary | ICD-10-CM | POA: Diagnosis not present

## 2018-06-17 DIAGNOSIS — I480 Paroxysmal atrial fibrillation: Secondary | ICD-10-CM | POA: Diagnosis not present

## 2018-06-17 DIAGNOSIS — K219 Gastro-esophageal reflux disease without esophagitis: Secondary | ICD-10-CM | POA: Diagnosis not present

## 2018-06-17 NOTE — Progress Notes (Signed)
Opened in error

## 2018-06-18 DIAGNOSIS — I5032 Chronic diastolic (congestive) heart failure: Secondary | ICD-10-CM | POA: Diagnosis not present

## 2018-06-18 DIAGNOSIS — K219 Gastro-esophageal reflux disease without esophagitis: Secondary | ICD-10-CM | POA: Diagnosis not present

## 2018-06-18 DIAGNOSIS — I130 Hypertensive heart and chronic kidney disease with heart failure and stage 1 through stage 4 chronic kidney disease, or unspecified chronic kidney disease: Secondary | ICD-10-CM | POA: Diagnosis not present

## 2018-06-18 DIAGNOSIS — N183 Chronic kidney disease, stage 3 (moderate): Secondary | ICD-10-CM | POA: Diagnosis not present

## 2018-06-18 DIAGNOSIS — M8008XD Age-related osteoporosis with current pathological fracture, vertebra(e), subsequent encounter for fracture with routine healing: Secondary | ICD-10-CM | POA: Diagnosis not present

## 2018-06-18 DIAGNOSIS — I480 Paroxysmal atrial fibrillation: Secondary | ICD-10-CM | POA: Diagnosis not present

## 2018-06-20 DIAGNOSIS — N183 Chronic kidney disease, stage 3 (moderate): Secondary | ICD-10-CM | POA: Diagnosis not present

## 2018-06-20 DIAGNOSIS — M8008XD Age-related osteoporosis with current pathological fracture, vertebra(e), subsequent encounter for fracture with routine healing: Secondary | ICD-10-CM | POA: Diagnosis not present

## 2018-06-20 DIAGNOSIS — K219 Gastro-esophageal reflux disease without esophagitis: Secondary | ICD-10-CM | POA: Diagnosis not present

## 2018-06-20 DIAGNOSIS — I480 Paroxysmal atrial fibrillation: Secondary | ICD-10-CM | POA: Diagnosis not present

## 2018-06-20 DIAGNOSIS — I130 Hypertensive heart and chronic kidney disease with heart failure and stage 1 through stage 4 chronic kidney disease, or unspecified chronic kidney disease: Secondary | ICD-10-CM | POA: Diagnosis not present

## 2018-06-20 DIAGNOSIS — I5032 Chronic diastolic (congestive) heart failure: Secondary | ICD-10-CM | POA: Diagnosis not present

## 2018-06-21 DIAGNOSIS — M8008XD Age-related osteoporosis with current pathological fracture, vertebra(e), subsequent encounter for fracture with routine healing: Secondary | ICD-10-CM | POA: Diagnosis not present

## 2018-06-21 DIAGNOSIS — I130 Hypertensive heart and chronic kidney disease with heart failure and stage 1 through stage 4 chronic kidney disease, or unspecified chronic kidney disease: Secondary | ICD-10-CM | POA: Diagnosis not present

## 2018-06-21 DIAGNOSIS — K219 Gastro-esophageal reflux disease without esophagitis: Secondary | ICD-10-CM | POA: Diagnosis not present

## 2018-06-21 DIAGNOSIS — N183 Chronic kidney disease, stage 3 (moderate): Secondary | ICD-10-CM | POA: Diagnosis not present

## 2018-06-21 DIAGNOSIS — I480 Paroxysmal atrial fibrillation: Secondary | ICD-10-CM | POA: Diagnosis not present

## 2018-06-21 DIAGNOSIS — I5032 Chronic diastolic (congestive) heart failure: Secondary | ICD-10-CM | POA: Diagnosis not present

## 2018-06-24 ENCOUNTER — Ambulatory Visit (INDEPENDENT_AMBULATORY_CARE_PROVIDER_SITE_OTHER): Payer: Medicare Other | Admitting: Internal Medicine

## 2018-06-24 ENCOUNTER — Encounter: Payer: Self-pay | Admitting: Internal Medicine

## 2018-06-24 DIAGNOSIS — R197 Diarrhea, unspecified: Secondary | ICD-10-CM

## 2018-06-24 DIAGNOSIS — R112 Nausea with vomiting, unspecified: Secondary | ICD-10-CM | POA: Diagnosis not present

## 2018-06-24 MED ORDER — ONDANSETRON 4 MG PO TBDP: 4.0000 mg | ORAL_TABLET | Freq: Three times a day (TID) | ORAL | 0 refills | Status: AC | PRN

## 2018-06-24 NOTE — Progress Notes (Signed)
Virtual Visit via Video Note  I connected with Danielle Adams on 06/24/18 at  1:15 PM EDT by a video enabled telemedicine application and verified that I am speaking with the correct person using two identifiers.   I discussed the limitations of evaluation and management by telemedicine and the availability of in person appointments. The patient expressed understanding and agreed to proceed.  The patient is currently at home and I am in the office.  I spoke primarily with her daughter who provided the history.   No referring provider.    History of Present Illness: This is an acute visit for nausea and diarrhea.  I had a virtual visit with her on 5/5 at which time she was constipated.    Her daughter denies any constipation - after our visit she started having normal BM's once a day.  She has not taken miralax or stool softeners.    Two days ago she started having soft BMs all day.  Every time she went to the bathroom she had a BM.  This continued yesterday all day.  Her stools were soft, but formed.   She called the home health nurse and she advised to have EMS evaluate her.      EMS thought she may be a little dehydrated.  Her BP was good.  the recommended pedialyte and liquids which is what she did.  she did not tolerate the Pedialyte - it did not stay down.  - she spit it up  Her daughter did give her an anti-diarrhea medication  and zofran yesterday.   Last night she slept ok.  Today she started throwing up.  It was all liquid.  She had some scrambled eggs with cheese and one piece of bacon for breakfast and ended up throwing it all up.  Her daughter called the home health nurse and she advised gingerale and crackers and she was able to keep that down.  Later she was hungry and she ate some oatmeal and kept that down.  She has not had a BM today.  She was wondering what to do.     She feels weak from having so many BM's and throwing up today.    There have been no fever or chills.  She  denies cough, wheeze and sob.  She denies abdominal pain.  She has had some family members around the past few days, but none have been sick.   Social History   Socioeconomic History  . Marital status: Widowed    Spouse name: Not on file  . Number of children: Not on file  . Years of education: Not on file  . Highest education level: Not on file  Occupational History  . Not on file  Social Needs  . Financial resource strain: Not on file  . Food insecurity:    Worry: Not on file    Inability: Not on file  . Transportation needs:    Medical: Not on file    Non-medical: Not on file  Tobacco Use  . Smoking status: Never Smoker  . Smokeless tobacco: Never Used  Substance and Sexual Activity  . Alcohol use: No  . Drug use: No  . Sexual activity: Not on file  Lifestyle  . Physical activity:    Days per week: Not on file    Minutes per session: Not on file  . Stress: Not on file  Relationships  . Social connections:    Talks on phone: Not on file  Gets together: Not on file    Attends religious service: Not on file    Active member of club or organization: Not on file    Attends meetings of clubs or organizations: Not on file    Relationship status: Not on file  Other Topics Concern  . Not on file  Social History Narrative   [employer]. 1 year business school. Married 1948-widowed 1981. 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 Just [place] [place]-living with daughter. Terminal care-does Not want cardiac resuscitation or mechanical ventilation, artifical nutrition or hydration, renal dialysis or major heroic intervention.      Observations/Objective: Appears well in NAD   Assessment and Plan:  See Problem List for Assessment and Plan of chronic medical problems.   Follow Up Instructions:    I discussed the assessment and treatment plan with the patient. The patient was provided an opportunity to ask questions  and all were answered. The patient agreed with the plan and demonstrated an understanding of the instructions.   The patient was advised to call back or seek an in-person evaluation if the symptoms worsen or if the condition fails to improve as anticipated.  Will order cbc, cmp  Pincus SanesStacy J Sanchez Hemmer, MD

## 2018-06-24 NOTE — Assessment & Plan Note (Signed)
Frequent, soft but formed stools for 2 days Yesterday her daughter gave her an antidiarrheal and there has been no bowel movement today Does not seem likely that it is infectious We will treat symptomatically for now Check CBC, CMP Bland diet, small meals  Her daughter will call if her symptoms persist/worsen

## 2018-06-24 NOTE — Assessment & Plan Note (Signed)
Started today - unknown quality Had frequent bowel movements-soft, formed stools the past 2 days Yesterday her daughter gave her Zofran and an antidiarrheal and she has not had any bowel movements yet today Unlikely infectious given that she is not having diarrhea She and her daughter both agree they do not want to go the emergency room Continue increased fluids Bland diet, small meals Okay to continue Zofran as needed Okay to use an antidiarrheal, but may need to cut the dose in half and ideally try to avoid so she does not get rebound constipation We will have home health nurse check CMP, CBC Her daughter will call back with any questions or concerns or if her symptoms persist

## 2018-06-25 ENCOUNTER — Telehealth: Payer: Self-pay | Admitting: Internal Medicine

## 2018-06-25 DIAGNOSIS — R1111 Vomiting without nausea: Secondary | ICD-10-CM | POA: Diagnosis not present

## 2018-06-25 DIAGNOSIS — M8008XD Age-related osteoporosis with current pathological fracture, vertebra(e), subsequent encounter for fracture with routine healing: Secondary | ICD-10-CM | POA: Diagnosis not present

## 2018-06-25 DIAGNOSIS — K219 Gastro-esophageal reflux disease without esophagitis: Secondary | ICD-10-CM | POA: Diagnosis not present

## 2018-06-25 DIAGNOSIS — R404 Transient alteration of awareness: Secondary | ICD-10-CM | POA: Diagnosis not present

## 2018-06-25 DIAGNOSIS — I130 Hypertensive heart and chronic kidney disease with heart failure and stage 1 through stage 4 chronic kidney disease, or unspecified chronic kidney disease: Secondary | ICD-10-CM | POA: Diagnosis not present

## 2018-06-25 DIAGNOSIS — I480 Paroxysmal atrial fibrillation: Secondary | ICD-10-CM | POA: Diagnosis not present

## 2018-06-25 DIAGNOSIS — R197 Diarrhea, unspecified: Secondary | ICD-10-CM | POA: Diagnosis not present

## 2018-06-25 DIAGNOSIS — T300 Burn of unspecified body region, unspecified degree: Secondary | ICD-10-CM | POA: Diagnosis not present

## 2018-06-25 DIAGNOSIS — N183 Chronic kidney disease, stage 3 (moderate): Secondary | ICD-10-CM | POA: Diagnosis not present

## 2018-06-25 DIAGNOSIS — I5032 Chronic diastolic (congestive) heart failure: Secondary | ICD-10-CM | POA: Diagnosis not present

## 2018-06-25 NOTE — Telephone Encounter (Signed)
Pt deceased today

## 2018-06-25 NOTE — Telephone Encounter (Signed)
noted 

## 2018-06-26 ENCOUNTER — Ambulatory Visit: Payer: Self-pay | Admitting: General Practice

## 2018-06-27 ENCOUNTER — Telehealth: Payer: Self-pay | Admitting: *Deleted

## 2018-06-27 NOTE — Telephone Encounter (Signed)
Received original D/C for [facility] and Katina Degree -D/C forwarded to Dr. Lawerance Bach (Primary) for signature.

## 2018-06-28 NOTE — Telephone Encounter (Signed)
Received signed original D/C,funeral home notified for pick up along with a faxing a copy as requested.

## 2018-07-08 DIAGNOSIS — 419620001 Death: Secondary | SNOMED CT | POA: Diagnosis not present

## 2018-07-08 DEATH — deceased

## 2018-07-29 ENCOUNTER — Encounter

## 2018-11-25 ENCOUNTER — Ambulatory Visit: Payer: Medicare Other | Admitting: Internal Medicine
